# Patient Record
Sex: Female | Born: 1965 | Race: White | Hispanic: No | Marital: Single | Smoking: Current every day smoker
Health system: Southern US, Community
[De-identification: ages and names within clinical notes are randomized; demographics above are authoritative.]

## PROBLEM LIST (undated history)

## (undated) DIAGNOSIS — G8929 Other chronic pain: Secondary | ICD-10-CM

## (undated) DIAGNOSIS — R0600 Dyspnea, unspecified: Secondary | ICD-10-CM

## (undated) DIAGNOSIS — F17200 Nicotine dependence, unspecified, uncomplicated: Secondary | ICD-10-CM

## (undated) DIAGNOSIS — F1190 Opioid use, unspecified, uncomplicated: Secondary | ICD-10-CM

## (undated) DIAGNOSIS — D649 Anemia, unspecified: Secondary | ICD-10-CM

## (undated) DIAGNOSIS — I10 Essential (primary) hypertension: Secondary | ICD-10-CM

## (undated) DIAGNOSIS — F419 Anxiety disorder, unspecified: Secondary | ICD-10-CM

## (undated) HISTORY — PX: ABDOMINAL HYSTERECTOMY: SHX81

---

## 2003-02-14 ENCOUNTER — Emergency Department (HOSPITAL_COMMUNITY): Admission: EM | Admit: 2003-02-14 | Discharge: 2003-02-14 | Payer: Self-pay | Admitting: Emergency Medicine

## 2003-02-14 ENCOUNTER — Encounter: Payer: Self-pay | Admitting: Emergency Medicine

## 2006-07-22 ENCOUNTER — Emergency Department (HOSPITAL_COMMUNITY): Admission: EM | Admit: 2006-07-22 | Discharge: 2006-07-22 | Payer: Self-pay | Admitting: Emergency Medicine

## 2007-02-15 ENCOUNTER — Emergency Department (HOSPITAL_COMMUNITY): Admission: EM | Admit: 2007-02-15 | Discharge: 2007-02-15 | Payer: Self-pay | Admitting: Emergency Medicine

## 2007-08-07 ENCOUNTER — Emergency Department (HOSPITAL_COMMUNITY): Admission: EM | Admit: 2007-08-07 | Discharge: 2007-08-07 | Payer: Self-pay | Admitting: Emergency Medicine

## 2007-09-13 ENCOUNTER — Emergency Department (HOSPITAL_COMMUNITY): Admission: EM | Admit: 2007-09-13 | Discharge: 2007-09-13 | Payer: Self-pay | Admitting: Emergency Medicine

## 2011-06-25 LAB — COMPREHENSIVE METABOLIC PANEL
Alkaline Phosphatase: 58
BUN: 6
Creatinine, Ser: 0.6
Glucose, Bld: 94
Potassium: 3.4 — ABNORMAL LOW
Total Protein: 6.7

## 2011-06-25 LAB — RAPID URINE DRUG SCREEN, HOSP PERFORMED
Benzodiazepines: NOT DETECTED
Cocaine: POSITIVE — AB
Tetrahydrocannabinol: NOT DETECTED

## 2011-06-25 LAB — URINALYSIS, ROUTINE W REFLEX MICROSCOPIC
Ketones, ur: NEGATIVE
Leukocytes, UA: NEGATIVE
Nitrite: NEGATIVE
Protein, ur: NEGATIVE
pH: 5.5

## 2011-06-25 LAB — DIFFERENTIAL
Basophils Absolute: 0.1
Basophils Relative: 2 — ABNORMAL HIGH
Lymphocytes Relative: 29
Monocytes Relative: 4
Neutro Abs: 5.6
Neutrophils Relative %: 65

## 2011-06-25 LAB — CBC
HCT: 41.2
Hemoglobin: 13.9
MCHC: 33.9
MCV: 91.7
Platelets: 325
RDW: 13.7

## 2011-06-25 LAB — TRICYCLICS SCREEN, URINE: TCA Scrn: NOT DETECTED

## 2011-06-25 LAB — POCT PREGNANCY, URINE: Operator id: 146091

## 2011-06-25 LAB — ETHANOL: Alcohol, Ethyl (B): 217 — ABNORMAL HIGH

## 2012-05-03 ENCOUNTER — Emergency Department (HOSPITAL_COMMUNITY): Payer: Self-pay

## 2012-05-03 ENCOUNTER — Emergency Department (HOSPITAL_COMMUNITY)
Admission: EM | Admit: 2012-05-03 | Discharge: 2012-05-04 | Disposition: A | Discharge status: Home / Self Care | Payer: Self-pay | Attending: Emergency Medicine | Admitting: Emergency Medicine

## 2012-05-03 ENCOUNTER — Encounter (HOSPITAL_COMMUNITY): Payer: Self-pay | Admitting: *Deleted

## 2012-05-03 DIAGNOSIS — R079 Chest pain, unspecified: Secondary | ICD-10-CM | POA: Insufficient documentation

## 2012-05-03 DIAGNOSIS — R42 Dizziness and giddiness: Secondary | ICD-10-CM | POA: Insufficient documentation

## 2012-05-03 DIAGNOSIS — M542 Cervicalgia: Secondary | ICD-10-CM | POA: Insufficient documentation

## 2012-05-03 DIAGNOSIS — M549 Dorsalgia, unspecified: Secondary | ICD-10-CM | POA: Insufficient documentation

## 2012-05-03 LAB — CBC WITH DIFFERENTIAL/PLATELET
Basophils Relative: 0 % (ref 0–1)
Eosinophils Absolute: 0.2 10*3/uL (ref 0.0–0.7)
MCH: 31.4 pg (ref 26.0–34.0)
MCHC: 34.4 g/dL (ref 30.0–36.0)
Monocytes Relative: 4 % (ref 3–12)
Neutrophils Relative %: 76 % (ref 43–77)
Platelets: 283 10*3/uL (ref 150–400)
RDW: 12.9 % (ref 11.5–15.5)

## 2012-05-03 LAB — BASIC METABOLIC PANEL
BUN: 19 mg/dL (ref 6–23)
GFR calc Af Amer: >90 mL/min (ref >90)
GFR calc non Af Amer: 85 mL/min — ABNORMAL LOW (ref >90)
Potassium: 4.1 mEq/L (ref 3.5–5.1)
Sodium: 136 mEq/L (ref 135–145)

## 2012-05-03 LAB — URINALYSIS, ROUTINE W REFLEX MICROSCOPIC
Bilirubin Urine: NEGATIVE
Nitrite: NEGATIVE
Specific Gravity, Urine: 1.019 (ref 1.005–1.030)
Urobilinogen, UA: 0.2 mg/dL (ref 0.0–1.0)

## 2012-05-03 NOTE — ED Provider Notes (Signed)
9:13 PM  Date: 05/03/2012  Rate: 76  Rhythm: normal sinus rhythm  QRS Axis: normal  Intervals: normal  ST/T Wave abnormalities: normal  Conduction Disutrbances:none  Narrative Interpretation: Normal EKG  Old EKG Reviewed: unchanged    Carleene Cooper III, MD 05/03/12 2115

## 2012-05-03 NOTE — ED Notes (Signed)
Per EMS they were called out for bilateral shoulder pain and dizziness. Pt reported taking 325mg  ASA prior to their arrival. EMS placed 20g IV Lt hand.

## 2012-05-03 NOTE — ED Provider Notes (Signed)
History     CSN: [id number]  Arrival date & time 05/03/12  2057   First MD Initiated Contact with Patient 05/03/12 2105      Chief Complaint  Patient presents with  . Shoulder Pain  . Dizziness    (Consider location/radiation/quality/duration/timing/severity/associated sxs/prior treatment) HPI Comments: Patient comes in today with a chief complaint of pain.  She reports that the pain is primarily located between her scapula.  She states that the pain is "sharp" and typically last a few minutes and then resolves.  Pain has been intermittent throughout the day.  She also states, "I am not sure if the pain is in my chest or my back when it comes."  Nothing makes the pain worse or brings on the pain.  Pain is associated with some dizziness.  She describes the dizziness as feeling lightheaded.  She denies syncope.  She also reports that she has had intermittent pain shooting down both her right and her left arm.  She denies numbness or tingling. She denies nausea or vomiting.  Denies shortness of breath.  Denies fevers or chills.  No prolonged travel or surgery in the past 4 weeks,  She is not on any estrogen containing medications.  No prior history of DVT or PE.  No LE swelling or pain.  She currently smokes 1 ppd.  No known history of HTN, hyperlipidemia, or DM.  She does not have a PCP.    The history is provided by the patient.    History reviewed. No pertinent past medical history.  History reviewed. No pertinent past surgical history.  History reviewed. No pertinent family history.  History  Substance Use Topics  . Smoking status: Not on file  . Smokeless tobacco: Not on file  . Alcohol Use: Not on file    OB History    Grav Para Term Preterm Abortions TAB SAB Ect Mult Living                  Review of Systems  Constitutional: Negative for fever, chills and diaphoresis.  HENT: Positive for neck pain.   Eyes: Negative for visual disturbance.  Respiratory: Negative for  cough and shortness of breath.   Cardiovascular: Positive for chest pain. Negative for palpitations and leg swelling.  Gastrointestinal: Negative for nausea and vomiting.  Musculoskeletal: Positive for back pain. Negative for gait problem.  Skin: Negative for color change.  Neurological: Positive for dizziness and light-headedness. Negative for syncope, facial asymmetry, speech difficulty, weakness, numbness and headaches.    Allergies  Review of patient's allergies indicates no known allergies.  Home Medications  No current outpatient prescriptions on file.  BP 149/104  Pulse 77  Temp 97.7 F (36.5 C) (Oral)  Resp 20  SpO2 100%  Physical Exam  Nursing note and vitals reviewed. Constitutional: She appears well-developed and well-nourished. No distress.  HENT:  Head: Normocephalic and atraumatic.  Mouth/Throat: Oropharynx is clear and moist.  Neck: Normal range of motion. Neck supple.  Cardiovascular: Normal rate, regular rhythm and normal heart sounds.   Pulmonary/Chest: Effort normal and breath sounds normal. No respiratory distress. She has no wheezes. She has no rales. She exhibits no tenderness.  Abdominal: Soft. There is no tenderness.  Musculoskeletal: Normal range of motion. She exhibits no edema.       Cervical back: She exhibits no tenderness, no bony tenderness, no swelling, no edema and no deformity.       Thoracic back: She exhibits normal range of motion, no  tenderness, no bony tenderness, no swelling, no edema and no deformity.       Lumbar back: She exhibits normal range of motion, no tenderness, no bony tenderness, no swelling, no edema and no deformity.  Neurological: She is alert.  Skin: Skin is warm and dry. She is not diaphoretic.  Psychiatric: She has a normal mood and affect.    ED Course  Procedures (including critical care time)  Labs Reviewed - No data to display Dg Chest 2 View  05/03/2012  *RADIOLOGY REPORT*  Clinical Data: Chest pain  CHEST - 2  VIEW  Comparison: Prior chest x-ray 02/15/2007  Findings:  Mild hyperinflation and chronic peribronchial cuffing. Negative for pulmonary edema, focal airspace consolidation or pulmonary nodule.  Cardiac and mediastinal contours are within normal limits.  No pneumothorax, or pleural effusion. No acute osseous findings.  IMPRESSION:  No acute cardiopulmonary disease.  Unchanged mild hyperinflation chronic peribronchial cuffing.   Original Report Authenticated By: Vilma Prader      No diagnosis found.  Patient discussed with Dr. Ignacia Palma who also evaluated the patient.    MDM  Patient presenting with a chief complaint of back pain and chest pain.  Pain has been intermittent throughout the day.  No prior cardiac history.  EKG unchanged.  Troponin negative  VSS.  Pulse ox 100 on RA.  No acute findings on CXR.  Suspect that pain is musculoskeletal.  Return precautions have been discussed with the patient. She is in agreement with the plan.        Pascal Lux [place], PA-C 05/04/12 0020

## 2012-05-04 MED ORDER — IBUPROFEN 200 MG PO TABS
600.0000 mg | ORAL_TABLET | Freq: Once | ORAL | Status: AC
  Administered 2012-05-04: 600 mg via ORAL
  Filled 2012-05-04: qty 3

## 2012-05-04 NOTE — ED Provider Notes (Signed)
Medical screening examination/treatment/procedure(s) were conducted as a shared visit with non-physician practitioner(s) and myself.  I personally evaluated the patient during the encounter 46 yo woman with interscapular pain that goes into the left arm.  She has no history of injury.  She is a smoker.  No history of CAD.  Exam shows pain clearly located in midthoracic region, not anterior chest.  EKG benign.  Lab tests benign. Advised smoking cessation.  Carleene Cooper III, MD 05/04/12 1149

## 2012-05-04 NOTE — ED Notes (Signed)
Pt discharged home in good condition. IV started by EMS removed from pts Lt hand prior to discharge.

## 2014-10-08 DIAGNOSIS — R1032 Left lower quadrant pain: Secondary | ICD-10-CM | POA: Insufficient documentation

## 2014-10-09 DIAGNOSIS — F17200 Nicotine dependence, unspecified, uncomplicated: Secondary | ICD-10-CM | POA: Insufficient documentation

## 2014-10-09 DIAGNOSIS — N939 Abnormal uterine and vaginal bleeding, unspecified: Secondary | ICD-10-CM | POA: Insufficient documentation

## 2015-02-12 DIAGNOSIS — Z87898 Personal history of other specified conditions: Secondary | ICD-10-CM | POA: Insufficient documentation

## 2015-02-12 DIAGNOSIS — D649 Anemia, unspecified: Secondary | ICD-10-CM | POA: Insufficient documentation

## 2015-02-12 DIAGNOSIS — T7491XA Unspecified adult maltreatment, confirmed, initial encounter: Secondary | ICD-10-CM | POA: Insufficient documentation

## 2015-02-12 DIAGNOSIS — R002 Palpitations: Secondary | ICD-10-CM | POA: Insufficient documentation

## 2015-02-12 DIAGNOSIS — I10 Essential (primary) hypertension: Secondary | ICD-10-CM | POA: Insufficient documentation

## 2015-02-12 DIAGNOSIS — F1491 Cocaine use, unspecified, in remission: Secondary | ICD-10-CM | POA: Insufficient documentation

## 2015-02-12 DIAGNOSIS — Z720 Tobacco use: Secondary | ICD-10-CM | POA: Insufficient documentation

## 2015-02-12 DIAGNOSIS — M546 Pain in thoracic spine: Secondary | ICD-10-CM | POA: Insufficient documentation

## 2015-11-14 DIAGNOSIS — B029 Zoster without complications: Secondary | ICD-10-CM | POA: Insufficient documentation

## 2016-04-15 ENCOUNTER — Encounter (HOSPITAL_COMMUNITY): Payer: Self-pay | Admitting: *Deleted

## 2016-04-15 ENCOUNTER — Emergency Department (HOSPITAL_COMMUNITY)
Admission: EM | Admit: 2016-04-15 | Discharge: 2016-04-15 | Disposition: A | Discharge status: Home / Self Care | Payer: Self-pay | Attending: Emergency Medicine | Admitting: Emergency Medicine

## 2016-04-15 DIAGNOSIS — F17210 Nicotine dependence, cigarettes, uncomplicated: Secondary | ICD-10-CM | POA: Insufficient documentation

## 2016-04-15 DIAGNOSIS — I10 Essential (primary) hypertension: Secondary | ICD-10-CM | POA: Insufficient documentation

## 2016-04-15 DIAGNOSIS — K047 Periapical abscess without sinus: Secondary | ICD-10-CM | POA: Insufficient documentation

## 2016-04-15 HISTORY — DX: Anxiety disorder, unspecified: F41.9

## 2016-04-15 HISTORY — DX: Essential (primary) hypertension: I10

## 2016-04-15 MED ORDER — CLINDAMYCIN HCL 300 MG PO CAPS: 300.0000 mg | ORAL_CAPSULE | Freq: Three times a day (TID) | ORAL | 0 refills | Status: DC

## 2016-04-15 MED ORDER — NAPROXEN 250 MG PO TABS
500.0000 mg | ORAL_TABLET | Freq: Once | ORAL | Status: AC
  Administered 2016-04-15: 500 mg via ORAL
  Filled 2016-04-15: qty 2

## 2016-04-15 MED ORDER — CLINDAMYCIN HCL 150 MG PO CAPS
300.0000 mg | ORAL_CAPSULE | Freq: Once | ORAL | Status: AC
  Administered 2016-04-15: 300 mg via ORAL
  Filled 2016-04-15: qty 2

## 2016-04-15 MED ORDER — NAPROXEN 500 MG PO TABS: 500.0000 mg | ORAL_TABLET | Freq: Two times a day (BID) | ORAL | 0 refills | Status: DC | PRN

## 2016-04-15 NOTE — Discharge Instructions (Signed)
You have a dental infection. Please take all of your antibiotics until finished! Keep your scheduled appointment with your dentist. Naproxen as needed for pain.  Eat a soft or liquid diet and rinse your mouth out after meals with warm water. You should see a dentist or return here at once if you have increased swelling, increased pain or uncontrolled bleeding from the site of your injury.  SEEK MEDICAL CARE IF:  You have increased pain not controlled with medicines.  You have swelling around your tooth, in your face or neck.  You have bleeding which starts, continues, or gets worse.  You have a fever >101 If you are unable to open your mouth

## 2016-04-15 NOTE — ED Notes (Signed)
Declined W/C at D/C and was escorted to lobby by RN. 

## 2016-04-15 NOTE — ED Provider Notes (Signed)
[facility] DEPT Provider Note   By signing my name below, I, Mesha Guinyard, attest that this documentation has been prepared under the direction and in the presence of Treatment Team:  Attending Provider: Fredia Sorrow, MD Physician Assistant: Ozella Almond Ward, PA-C.  Electronically Signed: Verlee Monte, Medical Scribe. 04/15/16. 11:22 AM.  CSN: [id number] Arrival date & time: 04/15/16  1107  First Provider Contact:  None     History   Chief Complaint Chief Complaint  Patient presents with  . Dental Pain    HPI Comments: Lydia Collins is a 50 y.o. female who presents to the Emergency Department complaining of worsening top and bottom right sided dental pain onset 3 days ago. She states that she is having associated symptoms of a beige colored discharge from the top tooth that she can taste. Pt mentions she hasn't been sleeping or eating well in the past 3 days due to the dental pain. She states that she has tried Tylenol, Ibuprofen, and Aleve with no relief for her symptoms. Pt has a dentist appointment at [facility] to get her teeth removed in 2 weeks. She denies trouble breathing, and trouble swallowing and any other symptoms.  Pt also mentions right sided pain after lifting a crate of tomatoes at work. Pt is a Psychologist, sport and exercise. No abdominal pain, back pain, fever, dysuria.   The history is provided by the patient. No language interpreter was used.    Past Medical History:  Diagnosis Date  . Anxiety   . Hypertension     There are no active problems to display for this patient.   History reviewed. No pertinent surgical history.  OB History    No data available       Home Medications    Prior to Admission medications   Medication Sig Start Date End Date Taking? Authorizing Provider  clindamycin (CLEOCIN) 300 MG capsule Take 1 capsule (300 mg total) by mouth 3 (three) times daily. 04/15/16   Ozella Almond Ward, PA-C  naproxen (NAPROSYN) 500 MG tablet Take 1  tablet (500 mg total) by mouth 2 (two) times daily as needed for moderate pain. 04/15/16   [place], PA-C    Family History History reviewed. No pertinent family history.  Social History Social History  Substance Use Topics  . Smoking status: Current Every Day Smoker    Packs/day: 1.00    Types: Cigars  . Smokeless tobacco: Never Used  . Alcohol use No     Allergies   Review of patient's allergies indicates no known allergies.   Review of Systems Review of Systems  Constitutional: Negative for fever.  HENT: Positive for dental problem. Negative for trouble swallowing.   Respiratory: Negative for shortness of breath and wheezing.   Gastrointestinal: Negative for abdominal pain, nausea and vomiting.  Musculoskeletal: Positive for myalgias.  Psychiatric/Behavioral: Positive for sleep disturbance.    Physical Exam Updated Vital Signs BP 138/93 (BP Location: Right Arm)   Pulse 97   Temp 97.9 F (36.6 C) (Oral)   Resp 17   Ht 5\' 4"  (1.626 m)   Wt 55.8 kg   LMP 04/05/2012   SpO2 99%   BMI 21.11 kg/m   Physical Exam  Constitutional: She is oriented to person, place, and time. She appears well-developed and well-nourished. No distress.  HENT:  Head: Normocephalic and atraumatic.  Mouth/Throat:    Dental cavities and poor oral dentition noted, pain along tooth as depicted in image, midline uvula, no trismus, oropharynx moist  and clear, no abscess or drainage noted, no oropharyngeal erythema or edema, neck supple and no tenderness. No facial edema  Cardiovascular: Normal rate, regular rhythm, normal heart sounds and intact distal pulses.  Exam reveals no gallop and no friction rub.   No murmur heard. Pulmonary/Chest: Effort normal and breath sounds normal. No respiratory distress. She has no wheezes. She has no rales. She exhibits no tenderness.  Abdominal: Soft. Bowel sounds are normal. She exhibits no distension. There is no tenderness.  Musculoskeletal: She  exhibits no edema.  Neurological: She is alert and oriented to person, place, and time.  Skin: Skin is warm and dry.  Nursing note and vitals reviewed.    ED Treatments / Results  Labs (all labs ordered are listed, but only abnormal results are displayed) Labs Reviewed - No data to display  DIAGNOSTIC STUDIES: Oxygen Saturation is 99% on RA, nl by my interpretation.    COORDINATION OF CARE: 11:41 AM Discussed treatment plan with pt at bedside and pt agreed to plan.  EKG  EKG Interpretation None       Radiology No results found.  Procedures Procedures (including critical care time)  Medications Ordered in ED Medications  clindamycin (CLEOCIN) capsule 300 mg (not administered)  naproxen (NAPROSYN) tablet 500 mg (not administered)     Initial Impression / Assessment and Plan / ED Course  I have reviewed the triage vital signs and the nursing notes.  Pertinent labs & imaging results that were available during my care of the patient were reviewed by me and considered in my medical decision making (see chart for details).  Clinical Course   Patient with dentalgia. The patient states that she had aphasia colored drainage from the tooth, however on exam, I do not appreciate any discharge or abscess requiring incision and drainage. Patient is afebrile, non toxic appearing, and swallowing secretions well. Exam not concerning for Ludwig's angina or pharyngeal abscess. Will treat with Clinda. Coupon provided and patient states price is affordable. Rx for naproxen. Patient has scheduled appointment with [facility] 2 weeks for tooth extraction and I stressed the importance of dental follow up for ultimate management of dental pain. Patient voices understanding and is agreeable to plan.  Final Clinical Impressions(s) / ED Diagnoses   Final diagnoses:  Dental infection    New Prescriptions New Prescriptions   CLINDAMYCIN (CLEOCIN) 300 MG CAPSULE    Take 1  capsule (300 mg total) by mouth 3 (three) times daily.   NAPROXEN (NAPROSYN) 500 MG TABLET    Take 1 tablet (500 mg total) by mouth 2 (two) times daily as needed for moderate pain.    I personally performed the services described in this documentation, which was scribed in my presence. The recorded information has been reviewed and is accurate.    [facility] Ward, PA-C 04/15/16 1141    Fredia Sorrow, MD 04/16/16 1038

## 2016-04-15 NOTE — ED Triage Notes (Signed)
PT reports dental pain 10/10 . Pt has as appt at [facility]. The patient also reports she picked up a heavy box at work this morning . Pt reports she now has pain in the RT side.

## 2020-05-15 ENCOUNTER — Other Ambulatory Visit: Payer: Self-pay | Admitting: Family Medicine

## 2020-05-15 DIAGNOSIS — Z1231 Encounter for screening mammogram for malignant neoplasm of breast: Secondary | ICD-10-CM

## 2020-05-23 ENCOUNTER — Other Ambulatory Visit: Payer: Self-pay

## 2020-05-23 ENCOUNTER — Ambulatory Visit
Admission: RE | Admit: 2020-05-23 | Discharge: 2020-05-23 | Disposition: A | Discharge status: Home / Self Care | Payer: Medicaid Other | Source: Ambulatory Visit | Attending: Family Medicine | Admitting: Family Medicine

## 2020-05-23 DIAGNOSIS — Z1231 Encounter for screening mammogram for malignant neoplasm of breast: Secondary | ICD-10-CM

## 2020-05-27 ENCOUNTER — Other Ambulatory Visit: Payer: Self-pay

## 2020-05-27 ENCOUNTER — Other Ambulatory Visit: Payer: Self-pay | Admitting: Family Medicine

## 2020-05-27 DIAGNOSIS — R928 Other abnormal and inconclusive findings on diagnostic imaging of breast: Secondary | ICD-10-CM

## 2020-06-07 ENCOUNTER — Other Ambulatory Visit: Payer: Medicaid Other

## 2020-07-02 ENCOUNTER — Ambulatory Visit
Admission: RE | Admit: 2020-07-02 | Discharge: 2020-07-02 | Disposition: A | Discharge status: Home / Self Care | Payer: Medicaid Other | Source: Ambulatory Visit | Attending: Family Medicine | Admitting: Family Medicine

## 2020-07-02 ENCOUNTER — Other Ambulatory Visit: Payer: Self-pay

## 2020-07-02 ENCOUNTER — Other Ambulatory Visit: Payer: Self-pay | Admitting: Family Medicine

## 2020-07-02 DIAGNOSIS — R928 Other abnormal and inconclusive findings on diagnostic imaging of breast: Secondary | ICD-10-CM

## 2020-07-12 ENCOUNTER — Ambulatory Visit
Admission: RE | Admit: 2020-07-12 | Discharge: 2020-07-12 | Disposition: A | Discharge status: Home / Self Care | Payer: Medicaid Other | Source: Ambulatory Visit | Attending: Family Medicine | Admitting: Family Medicine

## 2020-07-12 ENCOUNTER — Other Ambulatory Visit: Payer: Self-pay

## 2020-07-12 ENCOUNTER — Other Ambulatory Visit: Payer: Medicaid Other

## 2020-07-12 DIAGNOSIS — R928 Other abnormal and inconclusive findings on diagnostic imaging of breast: Secondary | ICD-10-CM

## 2020-07-22 ENCOUNTER — Ambulatory Visit: Payer: Medicaid Other

## 2020-07-22 NOTE — Progress Notes (Signed)
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 Breast, left, needle core biopsy, 4 o'clock, lower outer - INVASIVE DUCTAL CARCINOMA, GRADE 1/2. - SEE MICROSCOPIC DESCRIPTION. 2. Breast, left, needle core biopsy, 2 o'clock, 6 cmfn, upper outer - FIBROADENOMA. - NO MALIGNANCY IDENTIFIED. 3. Breast, left, needle core biopsy, 2 o'clock, 8 cmfn, upper outer - FIBROADENOMA. - NO MALIGNANCY IDENTIFIED. Microscopic Comment 1. The greatest tumor dimension is 0.9 cm. The tumor is positive with GATA3 and E-cadherin. Breast prognostic profile will be performed.  Receptor Status: waiting on report ER(), PR (), Her2-neu (), Ki-67()  Did patient present with symptoms (if so, please note symptoms) or was this found on screening mammography?:    Past/Anticipated interventions by surgeon, if any: 07/17/2020 Dr. Autumn Messing   Past/Anticipated interventions by medical oncology, if any:  Referral placed, but patient has not been contacted yet  Lymphedema issues, if any:  Patient denies    Pain issues, if any:  Occasional sharp pains to the breast. Reports arthritis/sciatica pain to her left hip.    SAFETY ISSUES:  Prior radiation? No  Pacemaker/ICD? No  Possible current pregnancy? No--Partial hysterectomy   Is the patient on methotrexate? No  Current Complaints / other details: Nothing of note

## 2020-07-22 NOTE — Progress Notes (Signed)
Radiation Oncology         [phone] ________________________________  Initial Outpatient Consultation by telephone.  The patient opted for telemedicine to maximize safety during the pandemic.  MyChart video was not obtainable.   Name: Lydia Collins MRN: [id number]  Date: 07/23/2020  DOB: 1966/05/24  CC:No primary care provider on file.  Jovita Kussmaul, MD   REFERRING PHYSICIAN: Autumn Messing III, MD  DIAGNOSIS:    ICD-10-CM   1. 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 female who presented with left breast abnormality on the following imaging: bilateral screening mammogram on the date of 05/23/2020. No symptoms were reported at that time. Ultrasound of left breast on 07/02/2020 revealed a suspicious mass in the left breast at the 4 o'clock position that corresponded with the area of distortion on mammography. It also showed two adjacent masses in the left breast at the 2 o'clock position, which had a more benign appearance and were considered possible fibroadenomas. There was no evidence of left axillary lymphadenopathy. Biopsy on the date of 07/12/2020 showed invasive ductal carcinoma of the lower outer left breast. The two adjacent masses in the upper outer quadrant were positive for fibroadenoma; no malignancy was identified. ER status: pending; PR status: pending, Her2 status: pending; Grade: 1/2.  [place] report: Ultrasound of the left breast at 2 o'clock, 6 cm from the nipple demonstrates an oval hypoechoic mass with indistinct margins measuring 0.7 x 0.3 x 0.5 cm.  The left breast at 2 o'clock, 9 cm from the nipple demonstrates an oval circumscribed hypoechoic mass measuring 0.6 x 0.3 x 0.6  cm.  Ultrasound of the left breast at 4 o'clock, 4 cm from the nipple demonstrates a prominent area of shadowing which spans approximately 1.9 cm with harmonics imaging. The mass at the superficial margin of the shadowing measures 8 x 6 x 8 mm.  Ultrasound of the left axilla demonstrates multiple normal-appearing lymph nodes.  She anticipates breast conserving surgery with Dr. [name].  She is waiting for appointment with medical oncology.  She has received 2 Covid vaccines thus far and is eligible for booster now.  PREVIOUS RADIATION THERAPY: No  PAST MEDICAL HISTORY:  has a past medical history of Anxiety and Hypertension.    PAST SURGICAL HISTORY: Past Surgical History:  Procedure Laterality Date  . ABDOMINAL HYSTERECTOMY     partial hysterectomy     FAMILY HISTORY: family history is not on file.  SOCIAL HISTORY:  reports that she has been smoking cigarettes. She has been smoking about 1.00 pack per day. She has never used smokeless tobacco. She reports that she does not drink alcohol and does not use drugs.  ALLERGIES: Patient has no known allergies.  MEDICATIONS:  Current Outpatient Medications  Medication Sig Dispense Refill  . Acetaminophen-Codeine 300-30 MG tablet Take 1 tablet by mouth 3 (three) times daily as needed.    . ALPRAZolam (XANAX) 0.5 MG tablet Take 0.5 mg by mouth 2 (two) times daily as needed.    . chlorthalidone (HYGROTON) 25 MG tablet Take 25 mg by mouth every morning.    . naproxen (NAPROSYN) 500 MG tablet Take 1 tablet (500 mg total) by mouth 2 (two) times daily as  needed for moderate pain. 20 tablet 0   No current facility-administered medications for this encounter.    REVIEW OF SYSTEMS: as above   PHYSICAL EXAM:  vitals were not taken for this visit.    General: Alert and oriented, in no acute distress      LABORATORY DATA:  Lab Results  Component Value Date   WBC 15.9 (H) 05/03/2012   HGB 14.8 05/03/2012   HCT 43.0 05/03/2012   MCV  91.3 05/03/2012   PLT 283 05/03/2012   CMP     Component Value Date/Time   NA 136 05/03/2012 2128   K 4.1 05/03/2012 2128   CL 103 05/03/2012 2128   CO2 22 05/03/2012 2128   GLUCOSE 110 (H) 05/03/2012 2128   BUN 19 05/03/2012 2128   CREATININE 0.83 05/03/2012 2128   CALCIUM 9.9 05/03/2012 2128   PROT 6.7 02/15/2007 1716   ALBUMIN 3.5 02/15/2007 1716   AST 15 02/15/2007 1716   ALT 11 02/15/2007 1716   ALKPHOS 58 02/15/2007 1716   BILITOT 0.4 02/15/2007 1716   GFRNONAA 85 (L) 05/03/2012 2128   GFRAA >90 05/03/2012 2128         RADIOGRAPHY: US BREAST LTD UNI LEFT INC AXILLA  Result Date: 07/02/2020 CLINICAL DATA:  Screening recall from baseline for a possible left breast mass and possible left breast distortion. EXAM: DIGITAL DIAGNOSTIC UNILATERAL LEFT MAMMOGRAM WITH TOMO AND CAD; ULTRASOUND LEFT BREAST LIMITED COMPARISON:  Previous exam(s). ACR Breast Density Category b: There are scattered areas of fibroglandular density. FINDINGS: Spot compression tomosynthesis images through the upper outer quadrant of the left breast demonstrates 2 adjacent oval masses each measuring 8-9 mm. There is a prominent distortion in the lower outer quadrant of the left breast in the middle to posterior depth. Mammographic images were processed with CAD. Ultrasound of the left breast at 2 o'clock, 6 cm from the nipple demonstrates an oval hypoechoic mass with indistinct margins measuring 0.7 x 0.3 x 0.5 cm. The left breast at 2 o'clock, 9 cm from the nipple demonstrates an oval circumscribed hypoechoic mass measuring 0.6 x 0.3 x 0.6 cm. Ultrasound of the left breast at 4 o'clock, 4 cm from the nipple demonstrates a prominent area of shadowing which spans approximately 1.9 cm with harmonics imaging. The mass at the superficial margin of the shadowing measures 8 x 6 x 8 mm. Ultrasound of the left axilla demonstrates multiple normal-appearing lymph nodes. IMPRESSION: 1. There is a suspicious mass in the left  breast at 4 o'clock corresponding with the area of distortion on mammography. 2. There are 2 adjacent masses in the left breast at 2 o'clock which have a more benign appearance, possibly fibroadenomas. 3.  No evidence of left axillary lymphadenopathy. RECOMMENDATION: Ultrasound guided biopsy is recommended for the left breast mass at 4 o'clock and the 2 adjacent masses in the left breast at 2 o'clock. The procedures have been scheduled for 07/12/2020 at 1:15 p.m. I have discussed the findings and recommendations with the patient. If applicable, a reminder letter will be sent to the patient regarding the next appointment. BI-RADS CATEGORY  5: Highly suggestive of malignancy. Electronically Signed   By: Ammie Ferrier M.D.   On: 07/02/2020 13:35   MM DIAG BREAST TOMO UNI LEFT  Result Date: 07/02/2020 CLINICAL DATA:  Screening recall from baseline for a possible left breast mass and possible left breast distortion. EXAM: DIGITAL DIAGNOSTIC UNILATERAL LEFT MAMMOGRAM WITH TOMO AND CAD; ULTRASOUND LEFT BREAST LIMITED COMPARISON:  Previous  exam(s). ACR Breast Density Category b: There are scattered areas of fibroglandular density. FINDINGS: Spot compression tomosynthesis images through the upper outer quadrant of the left breast demonstrates 2 adjacent oval masses each measuring 8-9 mm. There is a prominent distortion in the lower outer quadrant of the left breast in the middle to posterior depth. Mammographic images were processed with CAD. Ultrasound of the left breast at 2 o'clock, 6 cm from the nipple demonstrates an oval hypoechoic mass with indistinct margins measuring 0.7 x 0.3 x 0.5 cm. The left breast at 2 o'clock, 9 cm from the nipple demonstrates an oval circumscribed hypoechoic mass measuring 0.6 x 0.3 x 0.6 cm. Ultrasound of the left breast at 4 o'clock, 4 cm from the nipple demonstrates a prominent area of shadowing which spans approximately 1.9 cm with harmonics imaging. The mass at the superficial  margin of the shadowing measures 8 x 6 x 8 mm. Ultrasound of the left axilla demonstrates multiple normal-appearing lymph nodes. IMPRESSION: 1. There is a suspicious mass in the left breast at 4 o'clock corresponding with the area of distortion on mammography. 2. There are 2 adjacent masses in the left breast at 2 o'clock which have a more benign appearance, possibly fibroadenomas. 3.  No evidence of left axillary lymphadenopathy. RECOMMENDATION: Ultrasound guided biopsy is recommended for the left breast mass at 4 o'clock and the 2 adjacent masses in the left breast at 2 o'clock. The procedures have been scheduled for 07/12/2020 at 1:15 p.m. I have discussed the findings and recommendations with the patient. If applicable, a reminder letter will be sent to the patient regarding the next appointment. BI-RADS CATEGORY  5: Highly suggestive of malignancy. Electronically Signed   By: Ammie Ferrier M.D.   On: 07/02/2020 13:35   MM CLIP PLACEMENT LEFT  Result Date: 07/12/2020 CLINICAL DATA:  Evaluate placement of biopsy clips following 3 separate ultrasound-guided LEFT breast biopsies. EXAM: DIAGNOSTIC LEFT MAMMOGRAM POST ULTRASOUND BIOPSY COMPARISON:  Previous exam(s). FINDINGS: Mammographic images were obtained following ultrasound guided biopsies of a LOWER OUTER LEFT breast mass and 2 UPPER-OUTER LEFT breast masses. The RIBBON biopsy marking clip is in expected position at the site of biopsy of the 1.9 cm mass at the 4 o'clock position of the LEFT breast. The COIL biopsy marking clip is in expected position at the site of biopsy of the 0.7 cm mass at the 2 o'clock position of the LEFT breast 6 cm from the nipple. The HEART biopsy marking clip is in expected position at the site of biopsy of the 0.6 cm mass at the 2 o'clock position of the LEFT breast 8 cm from the nipple. IMPRESSION: Appropriate positioning of the RIBBON shaped biopsy marking clip at the site of biopsy in the LOWER OUTER LEFT breast.  Appropriate positioning of the COIL shaped biopsy marking clip at the site of biopsy in the UPPER OUTER LEFT breast. Appropriate positioning of the HEART shaped biopsy marking clip at the site of biopsy in the UPPER OUTER LEFT breast. Final Assessment: Post Procedure Mammograms for Marker Placement Electronically Signed   By: Margarette Canada M.D.   On: 07/12/2020 15:16   [place] LT BREAST BX W LOC DEV 1ST LESION IMG BX SPEC US GUIDE  Addendum Date: 07/16/2020   ADDENDUM REPORT: 07/16/2020 11:46 ADDENDUM: Pathology revealed GRADE I-II INVASIVE DUCTAL CARCINOMA of the LEFT breast, 4 o'clock, lower outer, (ribbon clip). This was found to be concordant by Dr. Hassan Rowan. Pathology revealed FIBROADENOMA of the LEFT breast, 2  o'clock, 6 cmfn, upper outer, (coil clip). This was found to be concordant by Dr. Hassan Rowan. Pathology revealed FIBROADENOMA of the LEFT breast, 2 o'clock, 8 cmfn, upper outer, (heart clip). This was found to be concordant by Dr. Hassan Rowan. Pathology results were discussed with the patient by telephone. The patient reported doing well after the biopsies with tenderness at the sites. Post biopsy instructions and care were reviewed and questions were answered. The patient was encouraged to call [facility] for any additional concerns. My direct phone number was provided. Surgical consultation has been arranged with Dr. Autumn Messing at [facility] on July 17, 2020. Pathology results reported by Terie Purser, RN on 07/16/2020. Electronically Signed   By: Margarette Canada M.D.   On: 07/16/2020 11:46   Result Date: 07/16/2020 CLINICAL DATA:  54 year old female for tissue sampling of 1 LOWER OUTER LEFT breast mass and 2 UPPER-OUTER LEFT breast masses. EXAM: ULTRASOUND GUIDED LEFT BREAST CORE NEEDLE BIOPSY X 3 COMPARISON:  Previous exam(s). FINDINGS: I met with the patient and we discussed the procedures of ultrasound-guided biopsies, including benefits and alternatives. We  discussed the high likelihood of successful procedures. We discussed the risks of the procedures, including infection, bleeding, tissue injury, clip migration, and inadequate sampling. Informed written consent was given. The usual time-out protocol was performed immediately prior to the procedures. ULTRASOUND GUIDED LEFT BREAST CORE NEEDLE BIOPSY #1 (1.9 cm mass at the 4 o'clock position - RIBBON clip): Using sterile technique and 1% Lidocaine as local anesthetic, under direct ultrasound visualization, a 12 gauge spring-loaded device was used to perform biopsy of 1.9 cm mass at the 4 o'clock position of the LEFT breast using a MEDIAL approach. At the conclusion of the procedure a RIBBON tissue marker clip was deployed into the biopsy cavity. Follow up 2 view mammogram was performed and dictated separately. ULTRASOUND GUIDED LEFT BREAST CORE NEEDLE BIOPSY #2 (0.7 cm mass at the 2 o'clock position 6 cm from the nipple - COIL clip): Using sterile technique and 1% Lidocaine as local anesthetic, under direct ultrasound visualization, a 12 gauge spring-loaded device was used to perform biopsy of the 0.7 cm mass at the 2 o'clock position of the LEFT breast 6 cm from the nipple using a MEDIAL approach. At the conclusion of the procedure a COIL tissue marker clip was deployed into the biopsy cavity. Follow up 2 view mammogram was performed and dictated separately. ULTRASOUND GUIDED LEFT BREAST CORE NEEDLE BIOPSY #3 (0.6 cm mass at the 2 o'clock position 8 cm from the nipple (on prior study labeled as 9 cm from the nipple) - HEART clip): Using sterile technique and 1% Lidocaine as local anesthetic, under direct ultrasound visualization, a 12 gauge spring-loaded device was used to perform biopsy of the 0.6 cm mass at the 2 o'clock position of the LEFT breast 8 cm from the nipple using a MEDIAL approach. At the conclusion of the procedure a HEART tissue marker clip was deployed into the biopsy cavity. Follow up 2 view mammogram  was performed and dictated separately. IMPRESSION: Three separate ultrasound guided LEFT breast biopsies - of a 1.9 cm LOWER OUTER LEFT breast mass (RIBBON clip), of a 0.7 cm UPPER-OUTER LEFT breast mass (COIL clip) and of a 0.6 cm UPPER-OUTER LEFT breast mass (HEART clip). No apparent complications. Electronically Signed: By: Margarette Canada M.D. On: 07/12/2020 14:57   [place] LT BREAST BX W LOC DEV EA ADD LESION IMG BX SPEC US GUIDE  Addendum Date: 07/16/2020   ADDENDUM REPORT: 07/16/2020 11:46 ADDENDUM: Pathology revealed GRADE I-II INVASIVE DUCTAL CARCINOMA of the LEFT breast, 4 o'clock, lower outer, (ribbon clip). This was found to be concordant by Dr. Hassan Rowan. Pathology revealed FIBROADENOMA of the LEFT breast, 2 o'clock, 6 cmfn, upper outer, (coil clip). This was found to be concordant by Dr. Hassan Rowan. Pathology revealed FIBROADENOMA of the LEFT breast, 2 o'clock, 8 cmfn, upper outer, (heart clip). This was found to be concordant by Dr. Hassan Rowan. Pathology results were discussed with the patient by telephone. The patient reported doing well after the biopsies with tenderness at the sites. Post biopsy instructions and care were reviewed and questions were answered. The patient was encouraged to call [facility] for any additional concerns. My direct phone number was provided. Surgical consultation has been arranged with Dr. Autumn Messing at [facility] on July 17, 2020. Pathology results reported by Terie Purser, RN on 07/16/2020. Electronically Signed   By: Margarette Canada M.D.   On: 07/16/2020 11:46   Result Date: 07/16/2020 CLINICAL DATA:  54 year old female for tissue sampling of 1 LOWER OUTER LEFT breast mass and 2 UPPER-OUTER LEFT breast masses. EXAM: ULTRASOUND GUIDED LEFT BREAST CORE NEEDLE BIOPSY X 3 COMPARISON:  Previous exam(s). FINDINGS: I met with the patient and we discussed the procedures of ultrasound-guided biopsies, including benefits and alternatives. We  discussed the high likelihood of successful procedures. We discussed the risks of the procedures, including infection, bleeding, tissue injury, clip migration, and inadequate sampling. Informed written consent was given. The usual time-out protocol was performed immediately prior to the procedures. ULTRASOUND GUIDED LEFT BREAST CORE NEEDLE BIOPSY #1 (1.9 cm mass at the 4 o'clock position - RIBBON clip): Using sterile technique and 1% Lidocaine as local anesthetic, under direct ultrasound visualization, a 12 gauge spring-loaded device was used to perform biopsy of 1.9 cm mass at the 4 o'clock position of the LEFT breast using a MEDIAL approach. At the conclusion of the procedure a RIBBON tissue marker clip was deployed into the biopsy cavity. Follow up 2 view mammogram was performed and dictated separately. ULTRASOUND GUIDED LEFT BREAST CORE NEEDLE BIOPSY #2 (0.7 cm mass at the 2 o'clock position 6 cm from the nipple - COIL clip): Using sterile technique and 1% Lidocaine as local anesthetic, under direct ultrasound visualization, a 12 gauge spring-loaded device was used to perform biopsy of the 0.7 cm mass at the 2 o'clock position of the LEFT breast 6 cm from the nipple using a MEDIAL approach. At the conclusion of the procedure a COIL tissue marker clip was deployed into the biopsy cavity. Follow up 2 view mammogram was performed and dictated separately. ULTRASOUND GUIDED LEFT BREAST CORE NEEDLE BIOPSY #3 (0.6 cm mass at the 2 o'clock position 8 cm from the nipple (on prior study labeled as 9 cm from the nipple) - HEART clip): Using sterile technique and 1% Lidocaine as local anesthetic, under direct ultrasound visualization, a 12 gauge spring-loaded device was used to perform biopsy of the 0.6 cm mass at the 2 o'clock position of the LEFT breast 8 cm from the nipple using a MEDIAL approach. At the conclusion of the procedure a HEART tissue marker clip was deployed into the biopsy cavity. Follow up 2 view mammogram  was performed and dictated separately. IMPRESSION: Three separate ultrasound guided LEFT breast biopsies - of a 1.9 cm LOWER OUTER LEFT breast mass (RIBBON clip), of a 0.7 cm UPPER-OUTER LEFT breast  mass (COIL clip) and of a 0.6 cm UPPER-OUTER LEFT breast mass (HEART clip). No apparent complications. Electronically Signed: By: Margarette Canada M.D. On: 07/12/2020 14:57   [place] LT BREAST BX W LOC DEV EA ADD LESION IMG BX SPEC US GUIDE  Addendum Date: 07/16/2020   ADDENDUM REPORT: 07/16/2020 11:46 ADDENDUM: Pathology revealed GRADE I-II INVASIVE DUCTAL CARCINOMA of the LEFT breast, 4 o'clock, lower outer, (ribbon clip). This was found to be concordant by Dr. Hassan Rowan. Pathology revealed FIBROADENOMA of the LEFT breast, 2 o'clock, 6 cmfn, upper outer, (coil clip). This was found to be concordant by Dr. Hassan Rowan. Pathology revealed FIBROADENOMA of the LEFT breast, 2 o'clock, 8 cmfn, upper outer, (heart clip). This was found to be concordant by Dr. Hassan Rowan. Pathology results were discussed with the patient by telephone. The patient reported doing well after the biopsies with tenderness at the sites. Post biopsy instructions and care were reviewed and questions were answered. The patient was encouraged to call [facility] [place] for any additional concerns. My direct phone number was provided. Surgical consultation has been arranged with Dr. Autumn Messing at [facility] on July 17, 2020. Pathology results reported by Terie Purser, RN on 07/16/2020. Electronically Signed   By: Margarette Canada M.D.   On: 07/16/2020 11:46   Result Date: 07/16/2020 CLINICAL DATA:  54 year old female for tissue sampling of 1 LOWER OUTER LEFT breast mass and 2 UPPER-OUTER LEFT breast masses. EXAM: ULTRASOUND GUIDED LEFT BREAST CORE NEEDLE BIOPSY X 3 COMPARISON:  Previous exam(s). FINDINGS: I met with the patient and we discussed the procedures of ultrasound-guided biopsies, including benefits and alternatives. We  discussed the high likelihood of successful procedures. We discussed the risks of the procedures, including infection, bleeding, tissue injury, clip migration, and inadequate sampling. Informed written consent was given. The usual time-out protocol was performed immediately prior to the procedures. ULTRASOUND GUIDED LEFT BREAST CORE NEEDLE BIOPSY #1 (1.9 cm mass at the 4 o'clock position - RIBBON clip): Using sterile technique and 1% Lidocaine as local anesthetic, under direct ultrasound visualization, a 12 gauge spring-loaded device was used to perform biopsy of 1.9 cm mass at the 4 o'clock position of the LEFT breast using a MEDIAL approach. At the conclusion of the procedure a RIBBON tissue marker clip was deployed into the biopsy cavity. Follow up 2 view mammogram was performed and dictated separately. ULTRASOUND GUIDED LEFT BREAST CORE NEEDLE BIOPSY #2 (0.7 cm mass at the 2 o'clock position 6 cm from the nipple - COIL clip): Using sterile technique and 1% Lidocaine as local anesthetic, under direct ultrasound visualization, a 12 gauge spring-loaded device was used to perform biopsy of the 0.7 cm mass at the 2 o'clock position of the LEFT breast 6 cm from the nipple using a MEDIAL approach. At the conclusion of the procedure a COIL tissue marker clip was deployed into the biopsy cavity. Follow up 2 view mammogram was performed and dictated separately. ULTRASOUND GUIDED LEFT BREAST CORE NEEDLE BIOPSY #3 (0.6 cm mass at the 2 o'clock position 8 cm from the nipple (on prior study labeled as 9 cm from the nipple) - HEART clip): Using sterile technique and 1% Lidocaine as local anesthetic, under direct ultrasound visualization, a 12 gauge spring-loaded device was used to perform biopsy of the 0.6 cm mass at the 2 o'clock position of the LEFT breast 8 cm from the nipple using a MEDIAL approach. At the conclusion of the procedure a HEART tissue marker  clip was deployed into the biopsy cavity. Follow up 2 view mammogram  was performed and dictated separately. IMPRESSION: Three separate ultrasound guided LEFT breast biopsies - of a 1.9 cm LOWER OUTER LEFT breast mass (RIBBON clip), of a 0.7 cm UPPER-OUTER LEFT breast mass (COIL clip) and of a 0.6 cm UPPER-OUTER LEFT breast mass (HEART clip). No apparent complications. Electronically Signed: By: Margarette Canada M.D. On: 07/12/2020 14:57      IMPRESSION/PLAN: Left breast cancer  It was a pleasure meeting the patient today.  The consensus is that she would be a good candidate for breast conservation. I talked to her about the option of a mastectomy and informed her that her expected overall survival would be equivalent between mastectomy and breast conservation, based upon randomized controlled data. She is enthusiastic about breast conservation.  I am not sure why the receptor status is still pending for her biopsy.  We have called pathology about this.  She understands that more information will need to be gathered to determine systemic therapy recommendations.  We discussed the risks, benefits, and side effects of radiotherapy. I recommend radiotherapy to the left breast to reduce her risk of locoregional recurrence by 2/3.  We discussed that radiation would take approximately 4-6 weeks to complete and that I would give the patient a few weeks to heal following surgery before starting treatment planning.  If chemotherapy were to be given, this would precede radiotherapy. We spoke about acute effects including skin irritation and fatigue as well as much less common late effects including internal organ injury or irritation. We spoke about the latest technology that is used to minimize the risk of late effects for patients undergoing radiotherapy to the breast or chest wall. No guarantees of treatment were given. The patient is enthusiastic about proceeding with treatment. I look forward to participating in the patient's care.  I will await her referral back to me for  postoperative follow-up and eventual CT simulation/treatment planning.  We discussed measures to reduce the risk of infection during the COVID-19 pandemic.  She is eligible for her booster shot.  She is going to arrange this at a local pharmacy.  She knows to get this administered to her right arm.  On date of service, in total, I spent 30 minutes on this encounter. This encounter was provided by telemedicine platform by telephone.  The patient opted for telemedicine to maximize safety during the pandemic.  MyChart video was not obtainable. The patient has given verbal consent for this type of encounter and has been advised to only accept a meeting of this type in a secure network environment. The attendants for this meeting include Eppie Gibson  and Daisey Must.  During the encounter, Eppie Gibson was located at [facility].  Daisey Must was located at home.    __________________________________________   Eppie Gibson, MD  This document serves as a record of services personally performed by Eppie Gibson, MD. It was created on his behalf by Clerance Lav, a trained medical scribe. The creation of this record is based on the scribe's personal observations and the provider's statements to them. This document has been checked and approved by the attending provider.

## 2020-07-23 ENCOUNTER — Encounter: Payer: Self-pay | Admitting: Radiation Oncology

## 2020-07-23 ENCOUNTER — Ambulatory Visit
Admission: RE | Admit: 2020-07-23 | Discharge: 2020-07-23 | Disposition: A | Discharge status: Home / Self Care | Payer: Medicaid Other | Source: Ambulatory Visit | Attending: Radiation Oncology | Admitting: Radiation Oncology

## 2020-07-23 ENCOUNTER — Other Ambulatory Visit: Payer: Self-pay

## 2020-07-23 DIAGNOSIS — C50512 Malignant neoplasm of lower-outer quadrant of left female breast: Secondary | ICD-10-CM | POA: Insufficient documentation

## 2020-07-24 ENCOUNTER — Ambulatory Visit: Payer: Self-pay | Admitting: General Surgery

## 2020-07-24 DIAGNOSIS — Z170 Estrogen receptor positive status [ER+]: Secondary | ICD-10-CM

## 2020-07-30 ENCOUNTER — Telehealth: Payer: Self-pay | Admitting: Hematology and Oncology

## 2020-07-30 ENCOUNTER — Other Ambulatory Visit: Payer: Self-pay | Admitting: General Surgery

## 2020-07-30 DIAGNOSIS — Z170 Estrogen receptor positive status [ER+]: Secondary | ICD-10-CM

## 2020-07-30 DIAGNOSIS — C50512 Malignant neoplasm of lower-outer quadrant of left female breast: Secondary | ICD-10-CM

## 2020-07-30 NOTE — Telephone Encounter (Signed)
Scheduled appointment per 11/23 new patient referral. Spoke to patient who is aware of appointment date and time.

## 2020-08-07 DIAGNOSIS — C801 Malignant (primary) neoplasm, unspecified: Secondary | ICD-10-CM

## 2020-08-07 HISTORY — DX: Malignant (primary) neoplasm, unspecified: C80.1

## 2020-08-07 NOTE — Progress Notes (Signed)
[facility] CONSULT NOTE  Patient Care Team: Patient, [name] as PCP - General (General Practice)  CHIEF COMPLAINTS/PURPOSE OF CONSULTATION:  Newly diagnosed breast cancer  HISTORY OF PRESENTING ILLNESS:  Lydia Collins 54 y.o. female is here because of recent diagnosis of invasive ductal carcinoma of the left breast. Screening mammogram on 05/23/20 showed a left breast distortion. Diagnostic mammogram on 07/02/20 showed a mass at the 4 o'clock position in the left breast, 0.8cm, and 2 adjacent masses in the left breast at the 2 o'clock position, 0.7cm and 0.6cm, and no left axillary lymphadenopathy. Biopsy on 07/12/20 showed no evidence of malignancy at the 2 o'clock position, and invasive ductal carcinoma at the 4 o'clock position, grade 1, HER-2 negative (1+), ER/PR+ 95%, Ki67 5%. She presents to the clinic today for initial evaluation and discussion of treatment options.   I reviewed her records extensively and collaborated the history with the patient.  SUMMARY OF ONCOLOGIC HISTORY: Oncology History  Malignant neoplasm of lower-outer quadrant of left female breast ([place])  07/23/2020 Initial Diagnosis   Screening mammogram showed a left breast distortion. Diagnostic mammogram showed a mass at the 4 o'clock position in the left breast, 0.8cm, and 2 adjacent masses in the left breast at the 2 o'clock position, 0.7cm and 0.6cm, and no left axillary lymphadenopathy. Biopsy showed no evidence of malignancy at the 2 o'clock position, and IDC at the 4 o'clock position, grade 1, HER-2 negative (1+), ER/PR+ 95%, Ki67 5%.      MEDICAL HISTORY:  Past Medical History:  Diagnosis Date  . Anxiety   . Hypertension     SURGICAL HISTORY: Past Surgical History:  Procedure Laterality Date  . ABDOMINAL HYSTERECTOMY     partial hysterectomy     SOCIAL HISTORY: Social History   Socioeconomic History  . Marital status: Single    Spouse name: Not on file  . Number of children:  Not on file  . Years of education: Not on file  . Highest education level: Not on file  Occupational History  . Not on file  Tobacco Use  . Smoking status: Current Every Day Smoker    Packs/day: 1.00    Types: Cigarettes  . Smokeless tobacco: Never Used  Vaping Use  . Vaping Use: Never used  Substance and Sexual Activity  . Alcohol use: No  . Drug use: No  . Sexual activity: Yes    Birth control/protection: Surgical  Other Topics Concern  . Not on file  Social History Narrative  . Not on file   Social Determinants of Health   Financial Resource Strain:   . Difficulty of Paying Living Expenses: Not on file  Food Insecurity:   . Worried About Charity fundraiser in the Last Year: Not on file  . Ran Out of Food in the Last Year: Not on file  Transportation Needs:   . Lack of Transportation (Medical): Not on file  . Lack of Transportation (Non-Medical): Not on file  Physical Activity:   . Days of Exercise per Week: Not on file  . Minutes of Exercise per Session: Not on file  Stress:   . Feeling of Stress : Not on file  Social Connections:   . Frequency of Communication with Friends and Family: Not on file  . Frequency of Social Gatherings with Friends and Family: Not on file  . Attends Religious Services: Not on file  . Active Member of Clubs or Organizations: Not on file  .  Attends Archivist Meetings: Not on file  . Marital Status: Not on file  Intimate Partner Violence:   . Fear of Current or Ex-Partner: Not on file  . Emotionally Abused: Not on file  . Physically Abused: Not on file  . Sexually Abused: Not on file    FAMILY HISTORY: Family History  Problem Relation Age of Onset  . Breast cancer Neg Hx     ALLERGIES:  has No Known Allergies.  MEDICATIONS:  Current Outpatient Medications  Medication Sig Dispense Refill  . Acetaminophen-Codeine 300-30 MG tablet Take 1 tablet by mouth 3 (three) times daily as needed.    . ALPRAZolam (XANAX) 0.5 MG  tablet Take 1 tablet (0.5 mg total) by mouth 2 (two) times daily as needed. 15 tablet 0  . chlorthalidone (HYGROTON) 25 MG tablet Take 25 mg by mouth every morning.    . naproxen (NAPROSYN) 500 MG tablet Take 1 tablet (500 mg total) by mouth 2 (two) times daily as needed for moderate pain. 20 tablet 0   No current facility-administered medications for this visit.    REVIEW OF SYSTEMS:     All other systems were reviewed with the patient and are negative.  PHYSICAL EXAMINATION: ECOG PERFORMANCE STATUS: 1 - Symptomatic but completely ambulatory  Vitals:   08/08/20 1612  BP: (!) 143/93  Pulse: (!) 107  Resp: 17  Temp: 98.3 F (36.8 C)  SpO2: 99%   Filed Weights   08/08/20 1612  Weight: 183 lb 1.6 oz (83.1 kg)       LABORATORY DATA:  I have reviewed the data as listed Lab Results  Component Value Date   WBC 15.9 (H) 05/03/2012   HGB 14.8 05/03/2012   HCT 43.0 05/03/2012   MCV 91.3 05/03/2012   PLT 283 05/03/2012   Lab Results  Component Value Date   NA 136 05/03/2012   K 4.1 05/03/2012   CL 103 05/03/2012   CO2 22 05/03/2012    RADIOGRAPHIC STUDIES: I have personally reviewed the radiological reports and agreed with the findings in the report.  ASSESSMENT AND PLAN:  Malignant neoplasm of lower-outer quadrant of left female breast ([place]) Screening mammogram showed a left breast distortion. Diagnostic mammogram showed a mass at the 4 o'clock position in the left breast, 0.8cm, and 2 adjacent masses in the left breast at the 2 o'clock position, 0.7cm and 0.6cm, and no left axillary lymphadenopathy. Biopsy showed no evidence of malignancy at the 2 o'clock position, and IDC at the 4 o'clock position, grade 1, HER-2 negative (1+), ER/PR+ 95%, Ki67 5%.   Recommendations: 1. Breast conserving surgery followed by 2. Adjuvant radiation therapy followed by 3. Adjuvant antiestrogen therapy  Return to clinic 1 week after surgery to discuss the pathology report and confirm  the adjuvant treatment plan.  All questions were answered. The patient knows to call the clinic with any problems, questions or concerns.   Rulon Eisenmenger, MD, MPH 08/08/2020    I, Molly Dorshimer, am acting as scribe for Nicholas Lose, MD.  I have reviewed the above documentation for accuracy and completeness, and I agree with the above.

## 2020-08-07 NOTE — Assessment & Plan Note (Signed)
Screening mammogram showed a left breast distortion. Diagnostic mammogram showed a mass at the 4 o'clock position in the left breast, 0.8cm, and 2 adjacent masses in the left breast at the 2 o'clock position, 0.7cm and 0.6cm, and no left axillary lymphadenopathy. Biopsy showed no evidence of malignancy at the 2 o'clock position, and IDC at the 4 o'clock position, grade 1, HER-2 negative (1+), ER/PR+ 95%, Ki67 5%.   Recommendations: 1. Breast conserving surgery followed by 2. Adjuvant radiation therapy followed by 3. Adjuvant antiestrogen therapy

## 2020-08-08 ENCOUNTER — Inpatient Hospital Stay: Payer: Medicaid Other | Attending: Hematology and Oncology | Admitting: Hematology and Oncology

## 2020-08-08 ENCOUNTER — Other Ambulatory Visit: Payer: Self-pay

## 2020-08-08 DIAGNOSIS — F17210 Nicotine dependence, cigarettes, uncomplicated: Secondary | ICD-10-CM | POA: Diagnosis not present

## 2020-08-08 DIAGNOSIS — C50512 Malignant neoplasm of lower-outer quadrant of left female breast: Secondary | ICD-10-CM | POA: Insufficient documentation

## 2020-08-08 MED ORDER — ALPRAZOLAM 0.5 MG PO TABS
0.5000 mg | ORAL_TABLET | Freq: Two times a day (BID) | ORAL | 0 refills | Status: DC | PRN
Start: 2020-08-08 — End: 2020-08-19

## 2020-08-09 ENCOUNTER — Encounter: Payer: Self-pay | Admitting: *Deleted

## 2020-08-09 ENCOUNTER — Telehealth: Payer: Self-pay | Admitting: *Deleted

## 2020-08-09 NOTE — Telephone Encounter (Signed)
Called pt, provided navigation resources and contact information. Denies questions or concerns regarding dx or treatment care plan. Encourage pt to call with needs. Received verbal understanding. Scheduled and confirmed appt with Dr. Lindi Adie on 1/3 at 9:15am for post op

## 2020-08-13 ENCOUNTER — Encounter: Payer: Self-pay | Admitting: Licensed Clinical Social Worker

## 2020-08-13 NOTE — Progress Notes (Signed)
[place] Work  Clinical Social Work was referred by new patient protocol for assessment of psychosocial needs.  Clinical Social Worker contacted patient by phone  to offer support and assess for needs.    Patient reports stressor of the boarding house she and her boyfriend live in being sold to another person and rent now being tripled. Has been told she needs to move out in ten days but has not received an official eviction letter through court system. Patient has already reached out to local agencies for potential assistance. CSW also gave information on Legal Aid and patient stated she will call them and then let this CSW know the outcome.  No other needs at this time.     [place], [place] Worker [employer]

## 2020-08-19 ENCOUNTER — Encounter (HOSPITAL_BASED_OUTPATIENT_CLINIC_OR_DEPARTMENT_OTHER): Payer: Self-pay | Admitting: General Surgery

## 2020-08-19 ENCOUNTER — Other Ambulatory Visit: Payer: Self-pay

## 2020-08-20 ENCOUNTER — Other Ambulatory Visit (HOSPITAL_COMMUNITY): Payer: Medicaid Other

## 2020-08-20 ENCOUNTER — Encounter (HOSPITAL_BASED_OUTPATIENT_CLINIC_OR_DEPARTMENT_OTHER)
Admission: RE | Admit: 2020-08-20 | Discharge: 2020-08-20 | Disposition: A | Discharge status: Home / Self Care | Payer: Medicaid Other | Source: Ambulatory Visit | Attending: General Surgery | Admitting: General Surgery

## 2020-08-20 ENCOUNTER — Ambulatory Visit: Payer: Medicaid Other | Attending: General Surgery | Admitting: Rehabilitation

## 2020-08-20 DIAGNOSIS — Z01812 Encounter for preprocedural laboratory examination: Secondary | ICD-10-CM | POA: Insufficient documentation

## 2020-08-20 LAB — BASIC METABOLIC PANEL
Anion gap: 10 (ref 5–15)
BUN: 10 mg/dL (ref 6–20)
CO2: 29 mmol/L (ref 22–32)
Calcium: 9.2 mg/dL (ref 8.9–10.3)
Chloride: 101 mmol/L (ref 98–111)
Creatinine, Ser: 1 mg/dL (ref 0.44–1.00)
GFR, Estimated: >60 mL/min (ref >60)
Glucose, Bld: 98 mg/dL (ref 70–99)
Potassium: 3.8 mmol/L (ref 3.5–5.1)
Sodium: 140 mmol/L (ref 135–145)

## 2020-08-20 NOTE — Progress Notes (Signed)

## 2020-08-22 ENCOUNTER — Encounter (HOSPITAL_COMMUNITY): Payer: Self-pay | Admitting: Certified Registered"

## 2020-08-22 ENCOUNTER — Other Ambulatory Visit (HOSPITAL_COMMUNITY)
Admission: RE | Admit: 2020-08-22 | Discharge: 2020-08-22 | Disposition: A | Discharge status: Home / Self Care | Payer: Medicaid Other | Source: Ambulatory Visit | Attending: General Surgery | Admitting: General Surgery

## 2020-08-22 ENCOUNTER — Inpatient Hospital Stay: Admission: RE | Admit: 2020-08-22 | Payer: Medicaid Other | Source: Ambulatory Visit

## 2020-08-22 DIAGNOSIS — Z01812 Encounter for preprocedural laboratory examination: Secondary | ICD-10-CM | POA: Insufficient documentation

## 2020-08-22 DIAGNOSIS — Z20822 Contact with and (suspected) exposure to covid-19: Secondary | ICD-10-CM | POA: Diagnosis not present

## 2020-08-22 LAB — SARS CORONAVIRUS 2 (TAT 6-24 HRS): SARS Coronavirus 2: NEGATIVE

## 2020-08-22 MED ORDER — BUPIVACAINE-EPINEPHRINE (PF) 0.25% -1:200000 IJ SOLN
INTRAMUSCULAR | Status: AC
  Filled 2020-08-22: qty 30

## 2020-08-22 NOTE — Progress Notes (Signed)
Called and spoke with patient about getting her COVID test for surgery tomorrow. Patient was currently on the way to get COVID test and her breast seed appointment. Gave patient the address to the COVID testing site and told her they will close at 3pm she stated she would be able to make it in time.

## 2020-08-23 ENCOUNTER — Encounter (HOSPITAL_COMMUNITY): Payer: Medicaid Other

## 2020-08-23 ENCOUNTER — Ambulatory Visit (HOSPITAL_BASED_OUTPATIENT_CLINIC_OR_DEPARTMENT_OTHER): Admission: RE | Admit: 2020-08-23 | Payer: Medicaid Other | Source: Home / Self Care | Admitting: General Surgery

## 2020-08-23 SURGERY — BREAST LUMPECTOMY WITH RADIOACTIVE SEED AND SENTINEL LYMPH NODE BIOPSY
Anesthesia: General | Site: Breast | Laterality: Left

## 2020-08-27 ENCOUNTER — Encounter: Payer: Self-pay | Admitting: *Deleted

## 2020-08-28 ENCOUNTER — Telehealth: Payer: Self-pay | Admitting: Hematology and Oncology

## 2020-08-28 NOTE — Telephone Encounter (Signed)
Scheduled apt per 12/21 sch msg - pt is aware of appt date and time

## 2020-09-04 ENCOUNTER — Encounter (HOSPITAL_BASED_OUTPATIENT_CLINIC_OR_DEPARTMENT_OTHER): Payer: Self-pay | Admitting: General Surgery

## 2020-09-04 ENCOUNTER — Other Ambulatory Visit: Payer: Self-pay

## 2020-09-09 ENCOUNTER — Ambulatory Visit: Payer: Medicaid Other | Admitting: Hematology and Oncology

## 2020-09-10 ENCOUNTER — Other Ambulatory Visit (HOSPITAL_COMMUNITY)
Admission: RE | Admit: 2020-09-10 | Discharge: 2020-09-10 | Disposition: A | Discharge status: Home / Self Care | Payer: Medicaid Other | Source: Ambulatory Visit | Attending: General Surgery | Admitting: General Surgery

## 2020-09-10 DIAGNOSIS — Z20822 Contact with and (suspected) exposure to covid-19: Secondary | ICD-10-CM | POA: Diagnosis not present

## 2020-09-10 DIAGNOSIS — Z01812 Encounter for preprocedural laboratory examination: Secondary | ICD-10-CM | POA: Diagnosis not present

## 2020-09-11 LAB — SARS CORONAVIRUS 2 (TAT 6-24 HRS): SARS Coronavirus 2: NEGATIVE

## 2020-09-12 ENCOUNTER — Other Ambulatory Visit: Payer: Self-pay

## 2020-09-12 ENCOUNTER — Ambulatory Visit
Admission: RE | Admit: 2020-09-12 | Discharge: 2020-09-12 | Disposition: A | Discharge status: Home / Self Care | Payer: Medicaid Other | Source: Ambulatory Visit | Attending: General Surgery | Admitting: General Surgery

## 2020-09-12 DIAGNOSIS — Z170 Estrogen receptor positive status [ER+]: Secondary | ICD-10-CM

## 2020-09-12 DIAGNOSIS — C50512 Malignant neoplasm of lower-outer quadrant of left female breast: Secondary | ICD-10-CM

## 2020-09-13 ENCOUNTER — Ambulatory Visit (HOSPITAL_BASED_OUTPATIENT_CLINIC_OR_DEPARTMENT_OTHER): Payer: Medicaid Other | Admitting: Certified Registered"

## 2020-09-13 ENCOUNTER — Encounter (HOSPITAL_COMMUNITY)
Admission: RE | Admit: 2020-09-13 | Discharge: 2020-09-13 | Disposition: A | Discharge status: Home / Self Care | Payer: Medicaid Other | Source: Ambulatory Visit | Attending: General Surgery | Admitting: General Surgery

## 2020-09-13 ENCOUNTER — Other Ambulatory Visit: Payer: Self-pay

## 2020-09-13 ENCOUNTER — Ambulatory Visit
Admission: RE | Admit: 2020-09-13 | Discharge: 2020-09-13 | Disposition: A | Discharge status: Home / Self Care | Payer: Medicaid Other | Source: Ambulatory Visit | Attending: General Surgery | Admitting: General Surgery

## 2020-09-13 ENCOUNTER — Encounter (HOSPITAL_BASED_OUTPATIENT_CLINIC_OR_DEPARTMENT_OTHER)
Admission: RE | Disposition: A | Discharge status: Home / Self Care | Payer: Self-pay | Source: Home / Self Care | Attending: General Surgery

## 2020-09-13 ENCOUNTER — Encounter (HOSPITAL_BASED_OUTPATIENT_CLINIC_OR_DEPARTMENT_OTHER): Payer: Self-pay | Admitting: General Surgery

## 2020-09-13 ENCOUNTER — Ambulatory Visit (HOSPITAL_BASED_OUTPATIENT_CLINIC_OR_DEPARTMENT_OTHER)
Admission: RE | Admit: 2020-09-13 | Discharge: 2020-09-13 | Disposition: A | Discharge status: Home / Self Care | Payer: Medicaid Other | Attending: General Surgery | Admitting: General Surgery

## 2020-09-13 DIAGNOSIS — Z808 Family history of malignant neoplasm of other organs or systems: Secondary | ICD-10-CM | POA: Diagnosis not present

## 2020-09-13 DIAGNOSIS — C50512 Malignant neoplasm of lower-outer quadrant of left female breast: Secondary | ICD-10-CM

## 2020-09-13 DIAGNOSIS — C773 Secondary and unspecified malignant neoplasm of axilla and upper limb lymph nodes: Secondary | ICD-10-CM | POA: Diagnosis not present

## 2020-09-13 DIAGNOSIS — Z800 Family history of malignant neoplasm of digestive organs: Secondary | ICD-10-CM | POA: Insufficient documentation

## 2020-09-13 DIAGNOSIS — Z79899 Other long term (current) drug therapy: Secondary | ICD-10-CM | POA: Diagnosis not present

## 2020-09-13 DIAGNOSIS — I10 Essential (primary) hypertension: Secondary | ICD-10-CM | POA: Diagnosis not present

## 2020-09-13 DIAGNOSIS — Z170 Estrogen receptor positive status [ER+]: Secondary | ICD-10-CM

## 2020-09-13 HISTORY — PX: BREAST LUMPECTOMY WITH RADIOACTIVE SEED AND SENTINEL LYMPH NODE BIOPSY: SHX6550

## 2020-09-13 HISTORY — DX: Dyspnea, unspecified: R06.00

## 2020-09-13 HISTORY — DX: Other chronic pain: G89.29

## 2020-09-13 HISTORY — DX: Opioid use, unspecified, uncomplicated: F11.90

## 2020-09-13 HISTORY — DX: Nicotine dependence, unspecified, uncomplicated: F17.200

## 2020-09-13 SURGERY — BREAST LUMPECTOMY WITH RADIOACTIVE SEED AND SENTINEL LYMPH NODE BIOPSY
Anesthesia: General | Site: Breast | Laterality: Left

## 2020-09-13 MED ORDER — LACTATED RINGERS IV SOLN: INTRAVENOUS | Status: DC

## 2020-09-13 MED ORDER — OXYCODONE HCL 5 MG PO TABS
5.0000 mg | ORAL_TABLET | Freq: Once | ORAL | Status: AC
  Administered 2020-09-13: 5 mg via ORAL

## 2020-09-13 MED ORDER — CHLORHEXIDINE GLUCONATE CLOTH 2 % EX PADS: 6.0000 | MEDICATED_PAD | Freq: Once | CUTANEOUS | Status: DC

## 2020-09-13 MED ORDER — GABAPENTIN 300 MG PO CAPS
300.0000 mg | ORAL_CAPSULE | ORAL | Status: AC
  Administered 2020-09-13: 300 mg via ORAL

## 2020-09-13 MED ORDER — CEFAZOLIN SODIUM 1 G IJ SOLR
INTRAMUSCULAR | Status: AC
  Filled 2020-09-13: qty 20

## 2020-09-13 MED ORDER — FENTANYL CITRATE (PF) 100 MCG/2ML IJ SOLN
INTRAMUSCULAR | Status: DC | PRN
  Administered 2020-09-13: 50 ug via INTRAVENOUS
  Administered 2020-09-13 (×2): 25 ug via INTRAVENOUS
  Administered 2020-09-13: 50 ug via INTRAVENOUS

## 2020-09-13 MED ORDER — BUPIVACAINE-EPINEPHRINE 0.25% -1:200000 IJ SOLN
INTRAMUSCULAR | Status: DC | PRN
  Administered 2020-09-13: 30 mL

## 2020-09-13 MED ORDER — BUPIVACAINE-EPINEPHRINE (PF) 0.25% -1:200000 IJ SOLN
INTRAMUSCULAR | Status: AC
  Filled 2020-09-13: qty 30

## 2020-09-13 MED ORDER — DEXAMETHASONE SODIUM PHOSPHATE 10 MG/ML IJ SOLN
INTRAMUSCULAR | Status: AC
  Filled 2020-09-13: qty 1

## 2020-09-13 MED ORDER — ACETAMINOPHEN 500 MG PO TABS
1000.0000 mg | ORAL_TABLET | ORAL | Status: AC
  Administered 2020-09-13: 1000 mg via ORAL

## 2020-09-13 MED ORDER — ONDANSETRON HCL 4 MG/2ML IJ SOLN
INTRAMUSCULAR | Status: DC | PRN
  Administered 2020-09-13: 4 mg via INTRAVENOUS

## 2020-09-13 MED ORDER — MIDAZOLAM HCL 2 MG/2ML IJ SOLN
INTRAMUSCULAR | Status: AC
  Filled 2020-09-13: qty 2

## 2020-09-13 MED ORDER — TECHNETIUM TC 99M TILMANOCEPT KIT
1.0000 | PACK | Freq: Once | INTRAVENOUS | Status: AC | PRN
  Administered 2020-09-13: 1 via INTRADERMAL

## 2020-09-13 MED ORDER — LIDOCAINE 2% (20 MG/ML) 5 ML SYRINGE
INTRAMUSCULAR | Status: AC
  Filled 2020-09-13: qty 5

## 2020-09-13 MED ORDER — SODIUM CHLORIDE (PF) 0.9 % IJ SOLN
INTRAMUSCULAR | Status: AC
  Filled 2020-09-13: qty 10

## 2020-09-13 MED ORDER — ACETAMINOPHEN 500 MG PO TABS
ORAL_TABLET | ORAL | Status: AC
  Filled 2020-09-13: qty 2

## 2020-09-13 MED ORDER — GABAPENTIN 300 MG PO CAPS
ORAL_CAPSULE | ORAL | Status: AC
  Filled 2020-09-13: qty 1

## 2020-09-13 MED ORDER — HYDROMORPHONE HCL 1 MG/ML IJ SOLN
0.2500 mg | INTRAMUSCULAR | Status: DC | PRN
  Administered 2020-09-13 (×3): 0.5 mg via INTRAVENOUS

## 2020-09-13 MED ORDER — ONDANSETRON HCL 4 MG/2ML IJ SOLN
INTRAMUSCULAR | Status: AC
  Filled 2020-09-13: qty 2

## 2020-09-13 MED ORDER — CELECOXIB 200 MG PO CAPS
ORAL_CAPSULE | ORAL | Status: AC
  Filled 2020-09-13: qty 1

## 2020-09-13 MED ORDER — EPHEDRINE 5 MG/ML INJ
INTRAVENOUS | Status: AC
  Filled 2020-09-13: qty 10

## 2020-09-13 MED ORDER — ARTIFICIAL TEARS OPHTHALMIC OINT
TOPICAL_OINTMENT | OPHTHALMIC | Status: AC
  Filled 2020-09-13: qty 3.5

## 2020-09-13 MED ORDER — MIDAZOLAM HCL 2 MG/2ML IJ SOLN
2.0000 mg | Freq: Once | INTRAMUSCULAR | Status: AC
  Administered 2020-09-13: 1 mg via INTRAVENOUS

## 2020-09-13 MED ORDER — OXYCODONE HCL 5 MG PO TABS: 5.0000 mg | ORAL_TABLET | Freq: Four times a day (QID) | ORAL | 0 refills | Status: DC | PRN

## 2020-09-13 MED ORDER — METHYLENE BLUE 0.5 % INJ SOLN
INTRAVENOUS | Status: AC
  Filled 2020-09-13: qty 10

## 2020-09-13 MED ORDER — CEFAZOLIN SODIUM-DEXTROSE 2-4 GM/100ML-% IV SOLN
INTRAVENOUS | Status: AC
  Filled 2020-09-13: qty 100

## 2020-09-13 MED ORDER — PROPOFOL 10 MG/ML IV BOLUS
INTRAVENOUS | Status: DC | PRN
  Administered 2020-09-13: 200 mg via INTRAVENOUS

## 2020-09-13 MED ORDER — ROCURONIUM BROMIDE 10 MG/ML (PF) SYRINGE
PREFILLED_SYRINGE | INTRAVENOUS | Status: AC
  Filled 2020-09-13: qty 10

## 2020-09-13 MED ORDER — DEXAMETHASONE SODIUM PHOSPHATE 10 MG/ML IJ SOLN
INTRAMUSCULAR | Status: DC | PRN
  Administered 2020-09-13: 10 mg via INTRAVENOUS

## 2020-09-13 MED ORDER — CELECOXIB 200 MG PO CAPS
200.0000 mg | ORAL_CAPSULE | ORAL | Status: AC
  Administered 2020-09-13: 200 mg via ORAL

## 2020-09-13 MED ORDER — 0.9 % SODIUM CHLORIDE (POUR BTL) OPTIME
TOPICAL | Status: DC | PRN
  Administered 2020-09-13: 250 mL

## 2020-09-13 MED ORDER — FENTANYL CITRATE (PF) 100 MCG/2ML IJ SOLN
INTRAMUSCULAR | Status: AC
  Filled 2020-09-13: qty 2

## 2020-09-13 MED ORDER — KETOROLAC TROMETHAMINE 30 MG/ML IJ SOLN
INTRAMUSCULAR | Status: AC
  Filled 2020-09-13: qty 1

## 2020-09-13 MED ORDER — MIDAZOLAM HCL 2 MG/2ML IJ SOLN
INTRAMUSCULAR | Status: DC | PRN
  Administered 2020-09-13: 2 mg via INTRAVENOUS

## 2020-09-13 MED ORDER — HYDROMORPHONE HCL 1 MG/ML IJ SOLN
INTRAMUSCULAR | Status: AC
  Filled 2020-09-13: qty 0.5

## 2020-09-13 MED ORDER — OXYCODONE HCL 5 MG PO TABS
ORAL_TABLET | ORAL | Status: AC
  Filled 2020-09-13: qty 1

## 2020-09-13 MED ORDER — PROPOFOL 10 MG/ML IV BOLUS
INTRAVENOUS | Status: AC
  Filled 2020-09-13: qty 40

## 2020-09-13 MED ORDER — CEFAZOLIN SODIUM-DEXTROSE 2-4 GM/100ML-% IV SOLN
2.0000 g | INTRAVENOUS | Status: AC
  Administered 2020-09-13: 2 g via INTRAVENOUS

## 2020-09-13 MED ORDER — LIDOCAINE 2% (20 MG/ML) 5 ML SYRINGE
INTRAMUSCULAR | Status: DC | PRN
  Administered 2020-09-13: 70 mg via INTRAVENOUS

## 2020-09-13 SURGICAL SUPPLY — 45 items
APPLIER CLIP 9.375 MED OPEN (MISCELLANEOUS) ×2
BINDER BREAST LRG (GAUZE/BANDAGES/DRESSINGS) ×2 IMPLANT
BLADE SURG 15 STRL LF DISP TIS (BLADE) ×1 IMPLANT
BLADE SURG 15 STRL SS (BLADE) ×2
CANISTER SUC SOCK COL 7IN (MISCELLANEOUS) IMPLANT
CANISTER SUCT 1200ML W/VALVE (MISCELLANEOUS) IMPLANT
CHLORAPREP W/TINT 26 (MISCELLANEOUS) ×2 IMPLANT
CLIP APPLIE 9.375 MED OPEN (MISCELLANEOUS) ×1 IMPLANT
COVER BACK TABLE 60X90IN (DRAPES) ×2 IMPLANT
COVER MAYO STAND STRL (DRAPES) ×2 IMPLANT
COVER PROBE W GEL 5X96 (DRAPES) ×2 IMPLANT
COVER WAND RF STERILE (DRAPES) IMPLANT
DECANTER SPIKE VIAL GLASS SM (MISCELLANEOUS) IMPLANT
DERMABOND ADVANCED (GAUZE/BANDAGES/DRESSINGS) ×2
DERMABOND ADVANCED .7 DNX12 (GAUZE/BANDAGES/DRESSINGS) ×2 IMPLANT
DRAPE LAPAROSCOPIC ABDOMINAL (DRAPES) ×2 IMPLANT
DRAPE UTILITY XL STRL (DRAPES) ×2 IMPLANT
ELECT COATED BLADE 2.86 ST (ELECTRODE) ×2 IMPLANT
ELECT REM PT RETURN 9FT ADLT (ELECTROSURGICAL) ×2
ELECTRODE REM PT RTRN 9FT ADLT (ELECTROSURGICAL) ×1 IMPLANT
GLOVE BIO SURGEON STRL SZ7.5 (GLOVE) ×4 IMPLANT
GLOVE BIOGEL PI IND STRL 7.0 (GLOVE) ×2 IMPLANT
GLOVE BIOGEL PI INDICATOR 7.0 (GLOVE) ×2
GLOVE SURG SS PI 7.0 STRL IVOR (GLOVE) ×2 IMPLANT
GOWN STRL REUS W/ TWL LRG LVL3 (GOWN DISPOSABLE) ×2 IMPLANT
GOWN STRL REUS W/TWL LRG LVL3 (GOWN DISPOSABLE) ×4
ILLUMINATOR WAVEGUIDE N/F (MISCELLANEOUS) IMPLANT
KIT MARKER MARGIN INK (KITS) ×2 IMPLANT
LIGHT WAVEGUIDE WIDE FLAT (MISCELLANEOUS) IMPLANT
NDL SAFETY ECLIPSE 18X1.5 (NEEDLE) IMPLANT
NEEDLE HYPO 18GX1.5 SHARP (NEEDLE)
NEEDLE HYPO 25X1 1.5 SAFETY (NEEDLE) ×2 IMPLANT
NS IRRIG 1000ML POUR BTL (IV SOLUTION) ×2 IMPLANT
PACK BASIN DAY SURGERY FS (CUSTOM PROCEDURE TRAY) ×2 IMPLANT
PENCIL SMOKE EVACUATOR (MISCELLANEOUS) ×2 IMPLANT
SLEEVE SCD COMPRESS KNEE MED (MISCELLANEOUS) ×2 IMPLANT
SPONGE LAP 18X18 RF (DISPOSABLE) ×2 IMPLANT
SUT MON AB 4-0 PC3 18 (SUTURE) ×4 IMPLANT
SUT SILK 2 0 SH (SUTURE) IMPLANT
SUT VICRYL 3-0 CR8 SH (SUTURE) ×2 IMPLANT
SYR CONTROL 10ML LL (SYRINGE) ×2 IMPLANT
TOWEL GREEN STERILE FF (TOWEL DISPOSABLE) ×2 IMPLANT
TRAY FAXITRON CT DISP (TRAY / TRAY PROCEDURE) ×2 IMPLANT
TUBE CONNECTING 20X1/4 (TUBING) IMPLANT
YANKAUER SUCT BULB TIP NO VENT (SUCTIONS) IMPLANT

## 2020-09-13 NOTE — Discharge Instructions (Signed)
No Tylenol or ibuprofen until after 1240pm today if needed, next dose of oxycodone after 5pm.  Post Anesthesia Home Care Instructions  Activity: Get plenty of rest for the remainder of the day. A responsible individual must stay with you for 24 hours following the procedure.  For the next 24 hours, DO NOT: -Drive a car -Paediatric nurse -Drink alcoholic beverages -Take any medication unless instructed by your physician -Make any legal decisions or sign important papers.  Meals: Start with liquid foods such as gelatin or soup. Progress to regular foods as tolerated. Avoid greasy, spicy, heavy foods. If nausea and/or vomiting occur, drink only clear liquids until the nausea and/or vomiting subsides. Call your physician if vomiting continues.  Special Instructions/Symptoms: Your throat may feel dry or sore from the anesthesia or the breathing tube placed in your throat during surgery. If this causes discomfort, gargle with warm salt water. The discomfort should disappear within 24 hours.  If you had a scopolamine patch placed behind your ear for the management of post- operative nausea and/or vomiting:  1. The medication in the patch is effective for 72 hours, after which it should be removed.  Wrap patch in a tissue and discard in the trash. Wash hands thoroughly with soap and water. 2. You may remove the patch earlier than 72 hours if you experience unpleasant side effects which may include dry mouth, dizziness or visual disturbances. 3. Avoid touching the patch. Wash your hands with soap and water after contact with the patch.

## 2020-09-13 NOTE — Interval H&P Note (Signed)
History and Physical Interval Note:  09/13/2020 8:03 AM  Lydia Collins  has presented today for surgery, with the diagnosis of LEFT BREAST CANCER.  The various methods of treatment have been discussed with the patient and family. After consideration of risks, benefits and other options for treatment, the patient has consented to  Procedure(s): LEFT BREAST LUMPECTOMY WITH RADIOACTIVE SEED AND SENTINEL LYMPH NODE BIOPSY (Left) as a surgical intervention.  The patient's history has been reviewed, patient examined, no change in status, stable for surgery.  I have reviewed the patient's chart and labs.  Questions were answered to the patient's satisfaction.     Autumn Messing III

## 2020-09-13 NOTE — Op Note (Signed)
09/13/2020  9:36 AM  PATIENT:  Lydia Collins  55 y.o. female  PRE-OPERATIVE DIAGNOSIS:  LEFT BREAST CANCER  POST-OPERATIVE DIAGNOSIS:  LEFT BREAST CANCER  PROCEDURE:  Procedure(s): LEFT BREAST LUMPECTOMY WITH RADIOACTIVE SEED LOCALIZATION AND DEEP LEFT AXILLARY SENTINEL LYMPH NODE BIOPSY (Left)  SURGEON:  Surgeon(s) and Role:    * Jovita Kussmaul, MD - Primary  PHYSICIAN ASSISTANT:   ASSISTANTS: none   ANESTHESIA:   local and general  EBL:  minimal   BLOOD ADMINISTERED:none  DRAINS: none   LOCAL MEDICATIONS USED:  MARCAINE     SPECIMEN:  Source of Specimen:  left breast tissue and sentinel nodes x 2  DISPOSITION OF SPECIMEN:  PATHOLOGY  COUNTS:  YES  TOURNIQUET:  * No tourniquets in log *  DICTATION: .Dragon Dictation   After informed consent was obtained the patient was brought to the operating room and placed in the supine position on the operating table.  After adequate induction of general anesthesia the patient's left chest, breast, and axillary areas were prepped with ChloraPrep, allowed to dry, draped in usual sterile manner.  An appropriate timeout was performed.  Previously an I-125 seed was placed in the lower outer quadrant to mark in the area of breast cancer.  Also earlier in the day the patient underwent injection of 1 mCi of technetium sulfur colloid in the subareolar position on the left.  The neoprobe was initially set to technetium and there was a good signal in the left axilla.  This area was infiltrated with quarter percent Marcaine.  A small transversely oriented incision was made in the axilla overlying the area of radioactivity with a 15 blade knife.  The incision was carried through the skin and subcutaneous tissue sharply with the electrocautery.  Dissection was then carried into the deep left axillary space.  Blunt hemostat dissection was directed by the neoprobe.  I was able to identify 2 hot lymph nodes.  These lymph nodes were excised sharply with  the electrocautery and the surrounding small vessels and lymphatics were controlled with clips.  Ex vivo counts on these nodes ranged from 50 to 200.  No other hot or palpable nodes were identified in the left axilla.  Hemostasis was achieved using the Bovie electrocautery.  The deep layer of the wound was then closed with interrupted 3-0 Vicryl stitches.  The skin was closed with a running 4-0 Monocryl subcuticular stitch.  Attention was then turned to the left breast.  The neoprobe was set to I-125 in the area of radioactivity was readily identified.  The area around this was infiltrated with core percent Marcaine.  A curvilinear incision was made along the lower outer inframammary fold with a 15 blade knife.  The dissection was then carried through the skin and subcutaneous tissue sharply with electrocautery until the dissection reached the chest wall.  Breast tissue was then separated from the chest wall muscle sharply with electrocautery.  Once the dissection was well beyond the area of the cancer then the dissection was carried anteriorly between the breast tissue and the skin and subcutaneous fat.  Once this dissection was well beyond the area of the cancer then a circular portion of breast tissue was excised sharply with the electrocautery around the radioactive seed while checking the area of radioactivity frequently.  Once the specimen was removed it was oriented with the appropriate paint colors.  A specimen radiograph was obtained that showed the clip and seed to be near the center of the  specimen.  The specimen was then sent to pathology for further evaluation.  Hemostasis was achieved using the Bovie electrocautery.  The wound was irrigated with saline and infiltrated with more quarter percent Marcaine.  The cavity was marked with clips.  The deep layer of the wound was then closed with layers of interrupted 3-0 Vicryl stitches.  The skin was closed with a running 4-0 Monocryl subcuticular stitch.   Dermabond dressings were applied.  The patient tolerated the procedure well.  At the end of the case all needle sponge and instrument counts were correct.  The patient was then awakened and taken recovery in stable condition.  PLAN OF CARE: Discharge to home after PACU  PATIENT DISPOSITION:  PACU - hemodynamically stable.   Delay start of Pharmacological VTE agent (>24hrs) due to surgical blood loss or risk of bleeding: not applicable

## 2020-09-13 NOTE — Anesthesia Preprocedure Evaluation (Signed)
Anesthesia Evaluation  Patient identified by MRN, date of birth, ID band Patient awake    Reviewed: Allergy & Precautions, NPO status , Patient's Chart, lab work & pertinent test results  Airway Mallampati: II  TM Distance: >3 FB     Dental   Pulmonary shortness of breath, Current Smoker and Patient abstained from smoking.,    breath sounds clear to auscultation       Cardiovascular hypertension,  Rhythm:Regular Rate:Normal     Neuro/Psych Anxiety    GI/Hepatic negative GI ROS, Neg liver ROS,   Endo/Other  negative endocrine ROS  Renal/GU negative Renal ROS     Musculoskeletal   Abdominal   Peds  Hematology   Anesthesia Other Findings   Reproductive/Obstetrics                             Anesthesia Physical Anesthesia Plan  ASA: III  Anesthesia Plan: General   Post-op Pain Management:    Induction: Intravenous  PONV Risk Score and Plan: 2 and Ondansetron, Dexamethasone and Midazolam  Airway Management Planned: LMA  Additional Equipment:   Intra-op Plan:   Post-operative Plan: Extubation in OR  Informed Consent: I have reviewed the patients History and Physical, chart, labs and discussed the procedure including the risks, benefits and alternatives for the proposed anesthesia with the patient or authorized representative who has indicated his/her understanding and acceptance.     Dental advisory given  Plan Discussed with: CRNA and Anesthesiologist  Anesthesia Plan Comments:         Anesthesia Quick Evaluation

## 2020-09-13 NOTE — Progress Notes (Signed)
Nuc med injections completed. Patient tolerated well.   

## 2020-09-13 NOTE — H&P (Signed)
Lydia Collins  Location: [facility] Surgery Patient #: [id number] DOB: 1966-01-28 Single / Language: Cleophus Molt / Race: White Female   History of Present Illness  The patient is a 55 year old female who presents with breast cancer. We are asked to see the patient in consultation by Dr. Hassan Rowan to evaluate her for a new left breast cancer. The patient is a 55 year old white female who recently went for a routine screening mammogram. At that time she was found to have 2 small 8 mm masses in the upper outer quadrant of the left breast and one 1.9 cm mass in the lower outer quadrant of the left breast. The lymph nodes looked normal. 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 The lower mass was found to be an invasive ductal cancer that was grade 1-2. The tumor markers have not been reported yet. She does smoke about 1 pack of cigarettes a day. She has a family history of colon cancer and bone cancer. She also has high blood pressure and anxiety.   Allergies No Known Drug Allergies   Medication History  Chlorthalidone (Oral) Specific strength unknown - Active. Tylenol with Codeine #3 (Oral) Specific strength unknown - Active. Medications Reconciled    Review of Systems  General Not Present- Appetite Loss, Chills, Fatigue, Fever, Night Sweats, Weight Gain and Weight Loss. Note: All other systems negative (unless as noted in HPI & included Review of Systems) Skin Not Present- Change in Wart/Mole, Dryness, Hives, Jaundice, New Lesions, Non-Healing Wounds, Rash and Ulcer. HEENT Not Present- Earache, Hearing Loss, Hoarseness, Nose Bleed, Oral Ulcers, Ringing in the Ears, Seasonal Allergies, Sinus Pain, Sore Throat, Visual Disturbances, Wears glasses/contact lenses and Yellow Eyes. Respiratory Not Present- Bloody sputum, Chronic Cough, Difficulty Breathing, Snoring and Wheezing. Breast Not Present- Breast Mass, Breast Pain, Nipple Discharge  and Skin Changes. Cardiovascular Not Present- Chest Pain, Difficulty Breathing Lying Down, Leg Cramps, Palpitations, Rapid Heart Rate, Shortness of Breath and Swelling of Extremities. Gastrointestinal Not Present- Abdominal Pain, Bloating, Bloody Stool, Change in Bowel Habits, Chronic diarrhea, Constipation, Difficulty Swallowing, Excessive gas, Gets full quickly at meals, Hemorrhoids, Indigestion, Nausea, Rectal Pain and Vomiting. Female Genitourinary Not Present- Frequency, Nocturia, Painful Urination, Pelvic Pain and Urgency. Musculoskeletal Not Present- Back Pain, Joint Pain, Joint Stiffness, Muscle Pain, Muscle Weakness and Swelling of Extremities. Neurological Not Present- Decreased Memory, Fainting, Headaches, Numbness, Seizures, Tingling, Tremor, Trouble walking and Weakness. Psychiatric Present- Anxiety. Not Present- Bipolar, Change in Sleep Pattern, Depression, Fearful and Frequent crying. Endocrine Not Present- Cold Intolerance, Excessive Hunger, Hair Changes, Heat Intolerance, Hot flashes and New Diabetes. Hematology Not Present- Easy Bruising, Excessive bleeding, Gland problems, HIV and Persistent Infections.  Vitals  Weight: 179.25 lb Height: 64in Body Surface Area: 1.87 m Body Mass Index: 30.77 kg/m  Temp.: 97.33F  Pulse: 130 (Regular)  BP: 132/84(Sitting, Left Arm, Standard)       Physical Exam General Mental Status-Alert. General Appearance-Consistent with stated age. Hydration-Well hydrated. Voice-Normal.  Head and Neck Head-normocephalic, atraumatic with no lesions or palpable masses. Trachea-midline. Thyroid Gland Characteristics - normal size and consistency.  Eye Eyeball - Bilateral-Extraocular movements intact. Sclera/Conjunctiva - Bilateral-No scleral icterus.  Chest and Lung Exam Chest and lung exam reveals -quiet, even and easy respiratory effort with no use of accessory muscles and on auscultation, normal breath sounds,  no adventitious sounds and normal vocal resonance. Inspection Chest Wall - Normal. Back - normal.  Breast Note: There is no palpable mass in  either breast. There is no palpable axillary, supraclavicular, or cervical lymphadenopathy.   Cardiovascular Cardiovascular examination reveals -normal heart sounds, regular rate and rhythm with no murmurs and normal pedal pulses bilaterally.  Abdomen Inspection Inspection of the abdomen reveals - No Hernias. Skin - Scar - no surgical scars. Palpation/Percussion Palpation and Percussion of the abdomen reveal - Soft, Non Tender, No Rebound tenderness, No Rigidity (guarding) and No hepatosplenomegaly. Auscultation Auscultation of the abdomen reveals - Bowel sounds normal.  Neurologic Neurologic evaluation reveals -alert and oriented x 3 with no impairment of recent or remote memory. Mental Status-Normal.  Musculoskeletal Normal Exam - Left-Upper Extremity Strength Normal and Lower Extremity Strength Normal. Normal Exam - Right-Upper Extremity Strength Normal and Lower Extremity Strength Normal.  Lymphatic Head & Neck  General Head & Neck Lymphatics: Bilateral - Description - Normal. Axillary  General Axillary Region: Bilateral - Description - Normal. Tenderness - Non Tender. Femoral & Inguinal  Generalized Femoral & Inguinal Lymphatics: Bilateral - Description - Normal. Tenderness - Non Tender.    Assessment & Plan MALIGNANT NEOPLASM OF LOWER-OUTER QUADRANT OF LEFT FEMALE BREAST, UNSPECIFIED ESTROGEN RECEPTOR STATUS (C50.512) Impression: The patient appears to have a 1.9 cm cancer in the lower outer quadrant of the left breast with clinically negative nodes. I have discussed with her in detail the different options for treatment. At this point we are waiting for her tumor markers to come back. If they are favorable than she is in favor of breast conservation and sentinel mapping. I have discussed with her in detail the risks  and benefits of the operation as well as the technical aspects including the use of a radioactive seed cauterization and she understands and wishes to proceed. If the cancer looks unfavorable then she may require neoadjuvant chemotherapy in which case a Port-A-Cath would need to be placed. I have also discussed this with her in detail. I will go ahead and refer her to medical and radiation oncology. We will call her with the results of her tumor markers once they've been reported. We will then make a plan for definitive treatment. This patient encounter took 60 minutes today to perform the following: take history, perform exam, review outside records, interpret imaging, counsel the patient on their diagnosis and document encounter, findings & plan in the EHR Current Plans Referred to Oncology, for evaluation and follow up (Oncology). Routine. Referred to Physical Therapy, for evaluation and follow up (Physical Therapy). Routine.

## 2020-09-13 NOTE — Transfer of Care (Signed)
Immediate Anesthesia Transfer of Care Note  Patient: Lydia Collins  Procedure(s) Performed: Procedure(s) (LRB): LEFT BREAST LUMPECTOMY WITH RADIOACTIVE SEED AND SENTINEL LYMPH NODE BIOPSY (Left)  Patient Location: PACU  Anesthesia Type: General  Level of Consciousness: awake, alert  and oriented  Airway & Oxygen Therapy: Patient Spontanous Breathing and Patient connected to nasal cannula oxygen  Post-op Assessment: Report given to PACU RN and Post -op Vital signs reviewed and stable  Post vital signs: Reviewed and stable  Complications: No apparent anesthesia complications  Last Vitals:  Vitals Value Taken Time  BP 141/93 09/13/20 0949  Temp    Pulse 100 09/13/20 0951  Resp 18 09/13/20 0951  SpO2 99 % 09/13/20 0951  Vitals shown include unvalidated device data.  Last Pain:  Vitals:   09/13/20 0637  PainSc: 8          Complications: No complications documented.

## 2020-09-13 NOTE — Anesthesia Procedure Notes (Signed)
Procedure Name: LMA Insertion Date/Time: 09/13/2020 8:22 AM Performed by: Mechele Claude, CRNA Pre-anesthesia Checklist: Patient identified, Emergency Drugs available, Suction available and Patient being monitored Patient Re-evaluated:Patient Re-evaluated prior to induction Oxygen Delivery Method: Circle system utilized Preoxygenation: Pre-oxygenation with 100% oxygen Induction Type: IV induction Ventilation: Mask ventilation without difficulty LMA: LMA inserted LMA Size: 4.0 Number of attempts: 1 Airway Equipment and Method: Bite block Placement Confirmation: positive ETCO2 Tube secured with: Tape Dental Injury: Teeth and Oropharynx as per pre-operative assessment

## 2020-09-13 NOTE — Anesthesia Postprocedure Evaluation (Signed)
Anesthesia Post Note  Patient: Lydia Collins  Procedure(s) Performed: LEFT BREAST LUMPECTOMY WITH RADIOACTIVE SEED AND SENTINEL LYMPH NODE BIOPSY (Left Breast)     Patient location during evaluation: PACU Anesthesia Type: General Level of consciousness: awake Pain management: pain level controlled Vital Signs Assessment: post-procedure vital signs reviewed and stable Respiratory status: spontaneous breathing Cardiovascular status: stable Postop Assessment: no apparent nausea or vomiting Anesthetic complications: no   No complications documented.  Last Vitals:  Vitals:   09/13/20 1030 09/13/20 1100  BP: 132/89 120/83  Pulse: 98 95  Resp: 20 18  Temp:  (!) 36.1 C  SpO2: 97% 97%    Last Pain:  Vitals:   09/13/20 1122  PainSc: 3                  Marchell Froman

## 2020-09-16 ENCOUNTER — Encounter (HOSPITAL_BASED_OUTPATIENT_CLINIC_OR_DEPARTMENT_OTHER): Payer: Self-pay | Admitting: General Surgery

## 2020-09-17 LAB — SURGICAL PATHOLOGY

## 2020-09-18 ENCOUNTER — Telehealth: Payer: Self-pay | Admitting: *Deleted

## 2020-09-18 ENCOUNTER — Encounter: Payer: Self-pay | Admitting: *Deleted

## 2020-09-18 NOTE — Telephone Encounter (Signed)
Received order for Mammaprint Testing. Requisition faxed to pathology and [facility]

## 2020-09-24 NOTE — Progress Notes (Incomplete)
Patient Care Team: Patient, [name] as PCP - General (General Practice) [name], RN as Oncology Nurse Navigator [name], RN as Oncology Nurse Navigator  DIAGNOSIS: No diagnosis found.  SUMMARY OF ONCOLOGIC HISTORY: Oncology History  Malignant neoplasm of lower-outer quadrant of left female breast ([place])  07/23/2020 Initial Diagnosis   Screening mammogram showed a left breast distortion. Diagnostic mammogram showed a mass at the 4 o'clock position in the left breast, 0.8cm, and 2 adjacent masses in the left breast at the 2 o'clock position, 0.7cm and 0.6cm, and no left axillary lymphadenopathy. Biopsy showed no evidence of malignancy at the 2 o'clock position, and IDC at the 4 o'clock position, grade 1, HER-2 negative (1+), ER/PR+ 95%, Ki67 5%.    09/13/2020 Surgery   Left lumpectomy [name]): invasive lobular carcinoma, 1.5cm, 2/3 left axillary lymph nodes positive for metastatic carcinoma.     CHIEF COMPLIANT: Follow-up s/p lumpectomy   INTERVAL HISTORY: Lydia Collins is a 55 y.o. with above-mentioned history of left breast cancer. She underwent a left lumpectomy on 09/13/20 with Dr. [name] for which pathology showed invasive lobular carcinoma, 1.5cm, 2/3 left axillary lymph nodes positive for metastatic carcinoma. She presents to the clinic today to review the pathology report and discuss further treatment.   ALLERGIES:  has No Known Allergies.  MEDICATIONS:  Current Outpatient Medications  Medication Sig Dispense Refill  . ALPRAZolam (XANAX) 0.5 MG tablet Take 0.5 mg by mouth 3 (three) times daily as needed for anxiety.    . chlorthalidone (HYGROTON) 25 MG tablet Take 25 mg by mouth every morning.    Marland Kitchen oxyCODONE (OXY IR/ROXICODONE) 5 MG immediate release tablet Take 1-2 tablets (5-10 mg total) by mouth every 6 (six) hours as needed for moderate pain, severe pain or breakthrough pain. 10 tablet 0   No current facility-administered medications for this visit.     PHYSICAL EXAMINATION: ECOG PERFORMANCE STATUS: {CHL ONC ECOG PS:[phone]}  There were no vitals filed for this visit. There were no vitals filed for this visit.  LABORATORY DATA:  I have reviewed the data as listed CMP Latest Ref Rng & Units 08/20/2020 05/03/2012 02/15/2007  Glucose 70 - 99 mg/dL 98 110(H) 94  BUN 6 - 20 mg/dL '10 19 6  ' Creatinine 0.44 - 1.00 mg/dL 1.00 0.83 0.60  Sodium 135 - 145 mmol/L 140 136 143  Potassium 3.5 - 5.1 mmol/L 3.8 4.1 3.4(L)  Chloride 98 - 111 mmol/L 101 103 114(H)  CO2 22 - 32 mmol/L '29 22 20  ' Calcium 8.9 - 10.3 mg/dL 9.2 9.9 8.4  Total Protein - - - 6.7  Total Bilirubin - - - 0.4  Alkaline Phos - - - 58  AST - - - 15  ALT - - - 11    Lab Results  Component Value Date   WBC 15.9 (H) 05/03/2012   HGB 14.8 05/03/2012   HCT 43.0 05/03/2012   MCV 91.3 05/03/2012   PLT 283 05/03/2012   NEUTROABS 12.0 (H) 05/03/2012    ASSESSMENT & PLAN:  No problem-specific Assessment & Plan notes found for this encounter.    No orders of the defined types were placed in this encounter.  The patient has a good understanding of the overall plan. she agrees with it. she will call with any problems that may develop before the next visit here.  Total time spent: *** mins including face to face time and time spent for planning, charting and coordination of care  Nicholas Lose, MD 09/24/2020  Julious Oka Dorshimer, am acting as scribe for Dr. Nicholas Lose.  {insert scribe attestation}

## 2020-09-25 ENCOUNTER — Inpatient Hospital Stay: Payer: Medicaid Other | Attending: Hematology and Oncology | Admitting: Hematology and Oncology

## 2020-09-25 DIAGNOSIS — Z170 Estrogen receptor positive status [ER+]: Secondary | ICD-10-CM | POA: Insufficient documentation

## 2020-09-25 DIAGNOSIS — C773 Secondary and unspecified malignant neoplasm of axilla and upper limb lymph nodes: Secondary | ICD-10-CM | POA: Insufficient documentation

## 2020-09-25 DIAGNOSIS — C50512 Malignant neoplasm of lower-outer quadrant of left female breast: Secondary | ICD-10-CM | POA: Insufficient documentation

## 2020-09-25 NOTE — Assessment & Plan Note (Deleted)
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  ER/PR 95%, Ki-67 5%, HER2 negative 1+ by IHC positive lateral margin  Pathology counseling: I discussed the final pathology report of the patient provided  a copy of this report. I discussed the margins as well as lymph node surgeries. We also discussed the final staging along with previously performed ER/PR and HER-2/neu testing.  Treatment plan: 1.  MammaPrint testing to determine if she would benefit from systemic chemotherapy 2. lateral margin will need to be cleared 3.  Adjuvant radiation therapy 4.  Follow-up adjuvant antiestrogen therapy  Return to clinic based upon MammaPrint test results.

## 2020-09-27 ENCOUNTER — Encounter: Payer: Self-pay | Admitting: Hematology and Oncology

## 2020-09-27 ENCOUNTER — Telehealth: Payer: Self-pay | Admitting: *Deleted

## 2020-09-27 ENCOUNTER — Encounter: Payer: Self-pay | Admitting: *Deleted

## 2020-09-27 DIAGNOSIS — C50512 Malignant neoplasm of lower-outer quadrant of left female breast: Secondary | ICD-10-CM

## 2020-09-27 NOTE — Telephone Encounter (Signed)
Attempted x2 to reach patient with mammaprint results of low risk. Her phone was not accepting calls. Referral placed back to Dr. Isidore Moos.

## 2020-10-04 ENCOUNTER — Encounter: Payer: Self-pay | Admitting: *Deleted

## 2020-10-06 NOTE — Progress Notes (Signed)
Patient Care Team: Patient, [name] as PCP - General (General Practice) Lydia Kaufmann, RN as Oncology Nurse Navigator Lydia Germany, RN as Oncology Nurse Navigator  DIAGNOSIS:    ICD-10-CM   1. 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 Diagnostic mammogram showed a mass at the 4 o'clock position in the left breast, 0.8cm, and 2 adjacent masses in the left breast at the 2 o'clock position, 0.7cm and 0.6cm, and no left axillary lymphadenopathy. Biopsy showed no evidence of malignancy at the 2 o'clock position, and IDC at the 4 o'clock position, grade 1, HER-2 negative (1+), ER/PR+ 95%, Ki67 5%.    09/13/2020 Surgery   Left lumpectomy Lydia Collins): invasive lobular carcinoma, 1.5cm, 2 left axillary lymph nodes positive for metastatic carcinoma.     CHIEF COMPLIANT: Follow-up s/p lumpectomy   INTERVAL HISTORY: Lydia Collins is a 55 y.o. with above-mentioned history of left breast cancer. She underwent a left lumpectomy on 09/13/20 with Dr. Marlou Collins for which pathology showed invasive lobular carcinoma, 1.5cm, carcinoma focally present at lateral margin, 2 axillary lymph nodes positive for carcinoma. She presents to the clinic today for follow-up to discuss further treatment.  We have have been trying to contact her for a while but apparently we had the wrong number where 1 number was off.  She finally contacted [place] and we were able to see her today.  ALLERGIES:  has No Known Allergies.  MEDICATIONS:  Current Outpatient Medications  Medication Sig Dispense Refill   gabapentin (NEURONTIN) 100 MG capsule Take 3 capsules (300 mg total) by mouth at bedtime. 90 capsule 3    ALPRAZolam (XANAX) 0.5 MG tablet Take 0.5 mg by mouth 3 (three) times daily as needed for anxiety.     chlorthalidone (HYGROTON) 25 MG tablet Take 25 mg by mouth every morning.     oxyCODONE (OXY IR/ROXICODONE) 5 MG immediate release tablet Take 1-2 tablets (5-10 mg total) by mouth every 6 (six) hours as needed for moderate pain, severe pain or breakthrough pain. 10 tablet 0   No current facility-administered medications for this visit.    PHYSICAL EXAMINATION: ECOG PERFORMANCE STATUS: 1 - Symptomatic but completely ambulatory  Vitals:   10/07/20 0928  BP: (!) 149/97  Pulse: 97  Resp: 18  Temp: 97.7 F (36.5 C)  SpO2: 98%   Filed Weights   10/07/20 0928  Weight: 175 lb 14.4 oz (79.8 kg)    LABORATORY DATA:  I have reviewed the data as listed CMP Latest Ref Rng & Units 08/20/2020 05/03/2012 02/15/2007  Glucose 70 - 99 mg/dL 98 110(H) 94  BUN 6 - 20 mg/dL '10 19 6  ' Creatinine 0.44 - 1.00 mg/dL 1.00 0.83 0.60  Sodium 135 - 145 mmol/L 140 136 143  Potassium 3.5 - 5.1 mmol/L 3.8 4.1 3.4(L)  Chloride 98 - 111 mmol/L 101 103 114(H)  CO2 22 - 32 mmol/L '29 22 20  ' Calcium 8.9 - 10.3 mg/dL 9.2 9.9 8.4  Total Protein - - - 6.7  Total Bilirubin - - - 0.4  Alkaline Phos - - - 58  AST - - - 15  ALT - - - 11    Lab Results  Component  Value Date   WBC 15.9 (H) 05/03/2012   HGB 14.8 05/03/2012   HCT 43.0 05/03/2012   MCV 91.3 05/03/2012   PLT 283 05/03/2012   NEUTROABS 12.0 (H) 05/03/2012    ASSESSMENT & PLAN:  Malignant neoplasm of lower-outer quadrant of left breast of female, estrogen receptor positive ([place]) 09/13/2020:Left lumpectomy Lydia Collins): Grade 1 invasive lobular carcinoma, 1.5cm, 2/3 left axillary lymph nodes positive for metastatic carcinoma.  ER/PR 95%, Ki-67 5%, HER2 negative 1+ by IHC positive lateral margin  Pathology counseling: I discussed the final pathology report of the patient provided  a copy of this report. I discussed the margins as well as lymph node  surgeries. We also discussed the final staging along with previously performed ER/PR and HER-2/neu testing. MammaPrint: Low risk  Treatment plan: 1.  lateral margin will need to be cleared, discussion regarding additional lymph node excision patient has an appointment tomorrow to see Dr. Marlou Collins. 2.  Adjuvant radiation therapy 3.  Follow-up adjuvant antiestrogen therapy  I explained to her that if the number of positive lymph nodes exceeds 4 or more then she will need systemic chemotherapy irrespective of the MammaPrint test result.    No orders of the defined types were placed in this encounter.  The patient has a good understanding of the overall plan. she agrees with it. she will call with any problems that may develop before the next visit here.  Total time spent: 30 mins including face to face time and time spent for planning, charting and coordination of care  Lydia Lose, MD 10/07/2020  I, Lydia Collins, am acting as scribe for Dr. Nicholas Collins.  I have reviewed the above documentation for accuracy and completeness, and I agree with the above.

## 2020-10-07 ENCOUNTER — Inpatient Hospital Stay (HOSPITAL_BASED_OUTPATIENT_CLINIC_OR_DEPARTMENT_OTHER): Payer: Medicaid Other | Admitting: Hematology and Oncology

## 2020-10-07 ENCOUNTER — Other Ambulatory Visit: Payer: Self-pay

## 2020-10-07 DIAGNOSIS — Z170 Estrogen receptor positive status [ER+]: Secondary | ICD-10-CM | POA: Diagnosis not present

## 2020-10-07 DIAGNOSIS — C50512 Malignant neoplasm of lower-outer quadrant of left female breast: Secondary | ICD-10-CM | POA: Diagnosis present

## 2020-10-07 DIAGNOSIS — C773 Secondary and unspecified malignant neoplasm of axilla and upper limb lymph nodes: Secondary | ICD-10-CM | POA: Diagnosis not present

## 2020-10-07 MED ORDER — GABAPENTIN 100 MG PO CAPS: 300.0000 mg | ORAL_CAPSULE | Freq: Every day | ORAL | 3 refills | Status: DC

## 2020-10-07 NOTE — Assessment & Plan Note (Signed)
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  ER/PR 95%, Ki-67 5%, HER2 negative 1+ by IHC positive lateral margin  Pathology counseling: I discussed the final pathology report of the patient provided  a copy of this report. I discussed the margins as well as lymph node surgeries. We also discussed the final staging along with previously performed ER/PR and HER-2/neu testing. MammaPrint: Low risk  Treatment plan: 1.  lateral margin will need to be cleared, discussion regarding additional lymph node excision 2.  Adjuvant radiation therapy 3.  Follow-up adjuvant antiestrogen therapy

## 2020-10-08 ENCOUNTER — Ambulatory Visit: Payer: Self-pay | Admitting: General Surgery

## 2020-10-15 ENCOUNTER — Ambulatory Visit: Payer: Medicaid Other | Admitting: Radiation Oncology

## 2020-10-15 ENCOUNTER — Ambulatory Visit: Payer: Medicaid Other

## 2020-10-16 ENCOUNTER — Ambulatory Visit: Payer: Medicaid Other | Admitting: Radiation Oncology

## 2020-10-22 ENCOUNTER — Telehealth: Payer: Self-pay | Admitting: *Deleted

## 2020-10-22 NOTE — Telephone Encounter (Signed)
Received call from pt requesting pain medication refill on a script originally prescribed by Dr. [name].  Per MD pt needing to contact [facility] and speak with Dr. [name] regarding refills.  Pt verbalized understanding.

## 2020-10-23 ENCOUNTER — Encounter: Payer: Self-pay | Admitting: *Deleted

## 2020-10-25 ENCOUNTER — Encounter: Payer: Self-pay | Admitting: *Deleted

## 2020-10-29 ENCOUNTER — Ambulatory Visit: Payer: Medicaid Other

## 2020-10-29 ENCOUNTER — Ambulatory Visit: Payer: Medicaid Other | Admitting: Radiation Oncology

## 2020-11-04 ENCOUNTER — Ambulatory Visit: Payer: Medicaid Other | Admitting: Radiation Oncology

## 2020-11-05 ENCOUNTER — Other Ambulatory Visit: Payer: Self-pay

## 2020-11-05 ENCOUNTER — Encounter (HOSPITAL_BASED_OUTPATIENT_CLINIC_OR_DEPARTMENT_OTHER)
Admission: RE | Admit: 2020-11-05 | Discharge: 2020-11-05 | Disposition: A | Discharge status: Home / Self Care | Payer: Medicaid Other | Source: Ambulatory Visit | Attending: General Surgery | Admitting: General Surgery

## 2020-11-05 ENCOUNTER — Other Ambulatory Visit (HOSPITAL_COMMUNITY)
Admission: RE | Admit: 2020-11-05 | Discharge: 2020-11-05 | Disposition: A | Discharge status: Home / Self Care | Payer: Medicaid Other | Source: Ambulatory Visit | Attending: General Surgery | Admitting: General Surgery

## 2020-11-05 ENCOUNTER — Encounter (HOSPITAL_BASED_OUTPATIENT_CLINIC_OR_DEPARTMENT_OTHER): Payer: Self-pay | Admitting: General Surgery

## 2020-11-05 DIAGNOSIS — Z20822 Contact with and (suspected) exposure to covid-19: Secondary | ICD-10-CM | POA: Insufficient documentation

## 2020-11-05 DIAGNOSIS — Z01812 Encounter for preprocedural laboratory examination: Secondary | ICD-10-CM | POA: Insufficient documentation

## 2020-11-05 LAB — BASIC METABOLIC PANEL
Anion gap: 12 (ref 5–15)
BUN: 9 mg/dL (ref 6–20)
CO2: 24 mmol/L (ref 22–32)
Calcium: 9.4 mg/dL (ref 8.9–10.3)
Chloride: 104 mmol/L (ref 98–111)
Creatinine, Ser: 0.72 mg/dL (ref 0.44–1.00)
GFR, Estimated: >60 mL/min (ref >60)
Glucose, Bld: 103 mg/dL — ABNORMAL HIGH (ref 70–99)
Potassium: 3.7 mmol/L (ref 3.5–5.1)
Sodium: 140 mmol/L (ref 135–145)

## 2020-11-05 LAB — SARS CORONAVIRUS 2 (TAT 6-24 HRS): SARS Coronavirus 2: NEGATIVE

## 2020-11-05 NOTE — Progress Notes (Signed)

## 2020-11-08 ENCOUNTER — Ambulatory Visit (HOSPITAL_BASED_OUTPATIENT_CLINIC_OR_DEPARTMENT_OTHER)
Admission: RE | Admit: 2020-11-08 | Discharge: 2020-11-08 | Disposition: A | Discharge status: Home / Self Care | Payer: Medicaid Other | Attending: General Surgery | Admitting: General Surgery

## 2020-11-08 ENCOUNTER — Ambulatory Visit (HOSPITAL_BASED_OUTPATIENT_CLINIC_OR_DEPARTMENT_OTHER): Payer: Medicaid Other | Admitting: Anesthesiology

## 2020-11-08 ENCOUNTER — Other Ambulatory Visit: Payer: Self-pay | Admitting: General Surgery

## 2020-11-08 ENCOUNTER — Other Ambulatory Visit: Payer: Self-pay

## 2020-11-08 ENCOUNTER — Encounter (HOSPITAL_BASED_OUTPATIENT_CLINIC_OR_DEPARTMENT_OTHER): Payer: Self-pay | Admitting: General Surgery

## 2020-11-08 ENCOUNTER — Encounter (HOSPITAL_BASED_OUTPATIENT_CLINIC_OR_DEPARTMENT_OTHER)
Admission: RE | Disposition: A | Discharge status: Home / Self Care | Payer: Self-pay | Source: Home / Self Care | Attending: General Surgery

## 2020-11-08 DIAGNOSIS — C50512 Malignant neoplasm of lower-outer quadrant of left female breast: Secondary | ICD-10-CM | POA: Insufficient documentation

## 2020-11-08 DIAGNOSIS — Z170 Estrogen receptor positive status [ER+]: Secondary | ICD-10-CM | POA: Insufficient documentation

## 2020-11-08 DIAGNOSIS — C773 Secondary and unspecified malignant neoplasm of axilla and upper limb lymph nodes: Secondary | ICD-10-CM | POA: Insufficient documentation

## 2020-11-08 HISTORY — PX: AXILLARY LYMPH NODE DISSECTION: SHX5229

## 2020-11-08 HISTORY — DX: Anemia, unspecified: D64.9

## 2020-11-08 HISTORY — PX: RE-EXCISION OF BREAST LUMPECTOMY: SHX6048

## 2020-11-08 SURGERY — EXCISION, LESION, BREAST
Anesthesia: General | Site: Breast | Laterality: Left

## 2020-11-08 MED ORDER — HEPARIN SODIUM (PORCINE) 5000 UNIT/ML IJ SOLN: 5000.0000 [IU] | Freq: Three times a day (TID) | INTRAMUSCULAR | Status: DC

## 2020-11-08 MED ORDER — BUPIVACAINE HCL (PF) 0.25 % IJ SOLN
INTRAMUSCULAR | Status: DC | PRN
  Administered 2020-11-08: 20 mL

## 2020-11-08 MED ORDER — GABAPENTIN 300 MG PO CAPS
ORAL_CAPSULE | ORAL | Status: AC
  Filled 2020-11-08: qty 1

## 2020-11-08 MED ORDER — PROPOFOL 10 MG/ML IV BOLUS
INTRAVENOUS | Status: DC | PRN
  Administered 2020-11-08: 200 mg via INTRAVENOUS
  Administered 2020-11-08: 100 mg via INTRAVENOUS

## 2020-11-08 MED ORDER — ONDANSETRON HCL 4 MG/2ML IJ SOLN
INTRAMUSCULAR | Status: DC | PRN
  Administered 2020-11-08: 4 mg via INTRAVENOUS

## 2020-11-08 MED ORDER — CEFAZOLIN SODIUM-DEXTROSE 2-4 GM/100ML-% IV SOLN
2.0000 g | INTRAVENOUS | Status: AC
  Administered 2020-11-08: 2 g via INTRAVENOUS

## 2020-11-08 MED ORDER — OXYCODONE HCL 5 MG/5ML PO SOLN: 5.0000 mg | Freq: Once | ORAL | Status: AC | PRN

## 2020-11-08 MED ORDER — ONDANSETRON HCL 4 MG/2ML IJ SOLN
INTRAMUSCULAR | Status: AC
  Filled 2020-11-08: qty 8

## 2020-11-08 MED ORDER — HYDROMORPHONE HCL 1 MG/ML IJ SOLN
0.2500 mg | INTRAMUSCULAR | Status: DC | PRN
  Administered 2020-11-08 (×3): 0.5 mg via INTRAVENOUS

## 2020-11-08 MED ORDER — DEXAMETHASONE SODIUM PHOSPHATE 10 MG/ML IJ SOLN
INTRAMUSCULAR | Status: AC
  Filled 2020-11-08: qty 3

## 2020-11-08 MED ORDER — LACTATED RINGERS IV SOLN: INTRAVENOUS | Status: DC

## 2020-11-08 MED ORDER — PANTOPRAZOLE SODIUM 40 MG IV SOLR: 40.0000 mg | Freq: Every day | INTRAVENOUS | Status: DC

## 2020-11-08 MED ORDER — HYDROMORPHONE HCL 1 MG/ML IJ SOLN
INTRAMUSCULAR | Status: AC
  Filled 2020-11-08: qty 0.5

## 2020-11-08 MED ORDER — CHLORTHALIDONE 25 MG PO TABS: 25.0000 mg | ORAL_TABLET | Freq: Every morning | ORAL | Status: DC

## 2020-11-08 MED ORDER — MIDAZOLAM HCL 2 MG/2ML IJ SOLN
2.0000 mg | Freq: Once | INTRAMUSCULAR | Status: AC
  Administered 2020-11-08: 2 mg via INTRAVENOUS

## 2020-11-08 MED ORDER — LIDOCAINE 2% (20 MG/ML) 5 ML SYRINGE
INTRAMUSCULAR | Status: DC | PRN
  Administered 2020-11-08: 100 mg via INTRAVENOUS

## 2020-11-08 MED ORDER — MORPHINE SULFATE (PF) 4 MG/ML IV SOLN: 1.0000 mg | INTRAVENOUS | Status: DC | PRN

## 2020-11-08 MED ORDER — HYDROCODONE-ACETAMINOPHEN 5-325 MG PO TABS: 1.0000 | ORAL_TABLET | ORAL | Status: DC | PRN

## 2020-11-08 MED ORDER — CHLORHEXIDINE GLUCONATE CLOTH 2 % EX PADS: 6.0000 | MEDICATED_PAD | Freq: Once | CUTANEOUS | Status: DC

## 2020-11-08 MED ORDER — GABAPENTIN 300 MG PO CAPS
300.0000 mg | ORAL_CAPSULE | ORAL | Status: AC
  Administered 2020-11-08: 300 mg via ORAL

## 2020-11-08 MED ORDER — FENTANYL CITRATE (PF) 100 MCG/2ML IJ SOLN
INTRAMUSCULAR | Status: AC
  Filled 2020-11-08: qty 2

## 2020-11-08 MED ORDER — AMISULPRIDE (ANTIEMETIC) 5 MG/2ML IV SOLN
INTRAVENOUS | Status: AC
  Filled 2020-11-08: qty 4

## 2020-11-08 MED ORDER — CELECOXIB 200 MG PO CAPS
200.0000 mg | ORAL_CAPSULE | ORAL | Status: AC
  Administered 2020-11-08: 200 mg via ORAL

## 2020-11-08 MED ORDER — MIDAZOLAM HCL 2 MG/2ML IJ SOLN
INTRAMUSCULAR | Status: AC
  Filled 2020-11-08: qty 2

## 2020-11-08 MED ORDER — 0.9 % SODIUM CHLORIDE (POUR BTL) OPTIME
TOPICAL | Status: DC | PRN
  Administered 2020-11-08: 120 mL

## 2020-11-08 MED ORDER — ROPIVACAINE HCL 5 MG/ML IJ SOLN
INTRAMUSCULAR | Status: DC | PRN
  Administered 2020-11-08: 30 mL

## 2020-11-08 MED ORDER — FENTANYL CITRATE (PF) 250 MCG/5ML IJ SOLN
INTRAMUSCULAR | Status: DC | PRN
  Administered 2020-11-08 (×6): 25 ug via INTRAVENOUS
  Administered 2020-11-08: 50 ug via INTRAVENOUS

## 2020-11-08 MED ORDER — CLONAZEPAM 0.5 MG PO TABS: 0.5000 mg | ORAL_TABLET | Freq: Two times a day (BID) | ORAL | Status: DC | PRN

## 2020-11-08 MED ORDER — FENTANYL CITRATE (PF) 100 MCG/2ML IJ SOLN
100.0000 ug | Freq: Once | INTRAMUSCULAR | Status: AC
  Administered 2020-11-08: 100 ug via INTRAVENOUS

## 2020-11-08 MED ORDER — PROPOFOL 10 MG/ML IV BOLUS
INTRAVENOUS | Status: AC
  Filled 2020-11-08: qty 20

## 2020-11-08 MED ORDER — EPHEDRINE 5 MG/ML INJ
INTRAVENOUS | Status: AC
  Filled 2020-11-08: qty 20

## 2020-11-08 MED ORDER — CELECOXIB 200 MG PO CAPS
ORAL_CAPSULE | ORAL | Status: AC
  Filled 2020-11-08: qty 1

## 2020-11-08 MED ORDER — OXYCODONE-ACETAMINOPHEN 5-325 MG PO TABS: 1.0000 | ORAL_TABLET | ORAL | 0 refills | Status: DC | PRN

## 2020-11-08 MED ORDER — AMISULPRIDE (ANTIEMETIC) 5 MG/2ML IV SOLN
10.0000 mg | Freq: Once | INTRAVENOUS | Status: AC
  Administered 2020-11-08: 10 mg via INTRAVENOUS

## 2020-11-08 MED ORDER — LIDOCAINE 2% (20 MG/ML) 5 ML SYRINGE
INTRAMUSCULAR | Status: AC
  Filled 2020-11-08: qty 15

## 2020-11-08 MED ORDER — GABAPENTIN 300 MG PO CAPS: 300.0000 mg | ORAL_CAPSULE | Freq: Every day | ORAL | Status: DC

## 2020-11-08 MED ORDER — SODIUM CHLORIDE 0.9 % IV SOLN: INTRAVENOUS | Status: DC

## 2020-11-08 MED ORDER — CEFAZOLIN SODIUM-DEXTROSE 2-4 GM/100ML-% IV SOLN
INTRAVENOUS | Status: AC
  Filled 2020-11-08: qty 100

## 2020-11-08 MED ORDER — HYDROCODONE-ACETAMINOPHEN 5-325 MG PO TABS: 1.0000 | ORAL_TABLET | Freq: Four times a day (QID) | ORAL | 0 refills | Status: DC | PRN

## 2020-11-08 MED ORDER — ACETAMINOPHEN 500 MG PO TABS
ORAL_TABLET | ORAL | Status: AC
  Filled 2020-11-08: qty 2

## 2020-11-08 MED ORDER — OXYCODONE HCL 5 MG PO TABS
ORAL_TABLET | ORAL | Status: AC
  Filled 2020-11-08: qty 1

## 2020-11-08 MED ORDER — ONDANSETRON 4 MG PO TBDP: 4.0000 mg | ORAL_TABLET | Freq: Four times a day (QID) | ORAL | Status: DC | PRN

## 2020-11-08 MED ORDER — ACETAMINOPHEN 500 MG PO TABS
1000.0000 mg | ORAL_TABLET | ORAL | Status: AC
  Administered 2020-11-08: 1000 mg via ORAL

## 2020-11-08 MED ORDER — KETOROLAC TROMETHAMINE 30 MG/ML IJ SOLN
INTRAMUSCULAR | Status: AC
  Filled 2020-11-08: qty 1

## 2020-11-08 MED ORDER — METHOCARBAMOL 500 MG PO TABS: 500.0000 mg | ORAL_TABLET | Freq: Four times a day (QID) | ORAL | Status: DC | PRN

## 2020-11-08 MED ORDER — ONDANSETRON HCL 4 MG/2ML IJ SOLN: 4.0000 mg | Freq: Four times a day (QID) | INTRAMUSCULAR | Status: DC | PRN

## 2020-11-08 MED ORDER — PHENYLEPHRINE 40 MCG/ML (10ML) SYRINGE FOR IV PUSH (FOR BLOOD PRESSURE SUPPORT)
PREFILLED_SYRINGE | INTRAVENOUS | Status: AC
  Filled 2020-11-08: qty 10

## 2020-11-08 MED ORDER — DEXAMETHASONE SODIUM PHOSPHATE 10 MG/ML IJ SOLN
INTRAMUSCULAR | Status: DC | PRN
  Administered 2020-11-08: 5 mg via INTRAVENOUS

## 2020-11-08 MED ORDER — ONDANSETRON HCL 4 MG/2ML IJ SOLN: 4.0000 mg | Freq: Once | INTRAMUSCULAR | Status: DC | PRN

## 2020-11-08 MED ORDER — OXYCODONE HCL 5 MG PO TABS
5.0000 mg | ORAL_TABLET | Freq: Once | ORAL | Status: AC | PRN
  Administered 2020-11-08: 5 mg via ORAL

## 2020-11-08 SURGICAL SUPPLY — 58 items
APL PRP STRL LF DISP 70% ISPRP (MISCELLANEOUS) ×1
APPLIER CLIP 11 MED OPEN (CLIP) ×3
APPLIER CLIP 9.375 MED OPEN (MISCELLANEOUS) ×6
BIOPATCH RED 1 DISK 7.0 (GAUZE/BANDAGES/DRESSINGS) ×3 IMPLANT
BLADE SURG 10 STRL SS (BLADE) ×3 IMPLANT
BLADE SURG 15 STRL LF DISP TIS (BLADE) ×2 IMPLANT
BLADE SURG 15 STRL SS (BLADE) ×3
CANISTER SUCT 1200ML W/VALVE (MISCELLANEOUS) ×3 IMPLANT
CHLORAPREP W/TINT 26 (MISCELLANEOUS) ×3 IMPLANT
CLIP APPLIE 11 MED OPEN (CLIP) ×2 IMPLANT
CLIP APPLIE 9.375 MED OPEN (MISCELLANEOUS) ×4 IMPLANT
COVER BACK TABLE 60X90IN (DRAPES) ×3 IMPLANT
COVER MAYO STAND STRL (DRAPES) ×3 IMPLANT
COVER SURGICAL LIGHT HANDLE (MISCELLANEOUS) ×3 IMPLANT
COVER WAND RF STERILE (DRAPES) IMPLANT
DECANTER SPIKE VIAL GLASS SM (MISCELLANEOUS) IMPLANT
DERMABOND ADVANCED (GAUZE/BANDAGES/DRESSINGS) ×1
DERMABOND ADVANCED .7 DNX12 (GAUZE/BANDAGES/DRESSINGS) ×2 IMPLANT
DRAIN CHANNEL 19F RND (DRAIN) ×3 IMPLANT
DRAIN HEMOVAC 1/8 X 5 (WOUND CARE) IMPLANT
DRAPE LAPAROSCOPIC ABDOMINAL (DRAPES) ×3 IMPLANT
DRAPE UTILITY XL STRL (DRAPES) ×3 IMPLANT
DRSG TEGADERM 2-3/8X2-3/4 SM (GAUZE/BANDAGES/DRESSINGS) ×3 IMPLANT
ELECT COATED BLADE 2.86 ST (ELECTRODE) ×3 IMPLANT
ELECT REM PT RETURN 9FT ADLT (ELECTROSURGICAL) ×3
ELECTRODE REM PT RTRN 9FT ADLT (ELECTROSURGICAL) ×2 IMPLANT
EVACUATOR SILICONE 100CC (DRAIN) ×3 IMPLANT
GLOVE SURG ENC MOIS LTX SZ6.5 (GLOVE) ×3 IMPLANT
GLOVE SURG ENC MOIS LTX SZ7.5 (GLOVE) ×6 IMPLANT
GLOVE SURG ENC MOIS LTX SZ8 (GLOVE) ×3 IMPLANT
GLOVE SURG UNDER POLY LF SZ6.5 (GLOVE) ×3 IMPLANT
GOWN STRL REUS W/ TWL LRG LVL3 (GOWN DISPOSABLE) ×4 IMPLANT
GOWN STRL REUS W/TWL 2XL LVL3 (GOWN DISPOSABLE) ×3 IMPLANT
GOWN STRL REUS W/TWL LRG LVL3 (GOWN DISPOSABLE) ×6
ILLUMINATOR WAVEGUIDE N/F (MISCELLANEOUS) IMPLANT
KIT MARKER MARGIN INK (KITS) IMPLANT
LIGHT WAVEGUIDE WIDE FLAT (MISCELLANEOUS) IMPLANT
NEEDLE HYPO 25X1 1.5 SAFETY (NEEDLE) ×3 IMPLANT
NS IRRIG 1000ML POUR BTL (IV SOLUTION) ×3 IMPLANT
PACK BASIN DAY SURGERY FS (CUSTOM PROCEDURE TRAY) ×3 IMPLANT
PENCIL SMOKE EVACUATOR (MISCELLANEOUS) ×3 IMPLANT
SLEEVE SCD COMPRESS KNEE MED (STOCKING) ×3 IMPLANT
SPONGE LAP 18X18 RF (DISPOSABLE) ×3 IMPLANT
STAPLER VISISTAT 35W (STAPLE) ×3 IMPLANT
SUT ETHILON 3 0 PS 1 (SUTURE) ×3 IMPLANT
SUT MON AB 4-0 PC3 18 (SUTURE) ×6 IMPLANT
SUT SILK 2 0 SH (SUTURE) IMPLANT
SUT VIC AB 3-0 54X BRD REEL (SUTURE) IMPLANT
SUT VIC AB 3-0 BRD 54 (SUTURE)
SUT VIC AB 3-0 SH 27 (SUTURE)
SUT VIC AB 3-0 SH 27X BRD (SUTURE) IMPLANT
SUT VICRYL 3-0 CR8 SH (SUTURE) ×3 IMPLANT
SYR BULB EAR ULCER 3OZ GRN STR (SYRINGE) IMPLANT
SYR CONTROL 10ML LL (SYRINGE) ×3 IMPLANT
TOWEL GREEN STERILE FF (TOWEL DISPOSABLE) ×3 IMPLANT
TRAY FAXITRON CT DISP (TRAY / TRAY PROCEDURE) IMPLANT
TUBE CONNECTING 20X1/4 (TUBING) ×3 IMPLANT
YANKAUER SUCT BULB TIP NO VENT (SUCTIONS) ×3 IMPLANT

## 2020-11-08 NOTE — Discharge Instructions (Signed)

## 2020-11-08 NOTE — Transfer of Care (Signed)
Immediate Anesthesia Transfer of Care Note  Patient: Lydia Collins  Procedure(s) Performed: RE-EXCISION OF LEFT BREAST MARGIN (Left Breast) LEFT AXILLARY LYMPH NODE DISSECTION (Left Breast)  Patient Location: PACU  Anesthesia Type:GA combined with regional for post-op pain  Level of Consciousness: awake, alert  and oriented  Airway & Oxygen Therapy: Patient Spontanous Breathing and Patient connected to face mask oxygen  Post-op Assessment: Report given to RN and Post -op Vital signs reviewed and stable  Post vital signs: Reviewed and stable  Last Vitals:  Vitals Value Taken Time  BP 149/98 11/08/20 1537  Temp    Pulse 107 11/08/20 1538  Resp 14 11/08/20 1538  SpO2 100 % 11/08/20 1538  Vitals shown include unvalidated device data.  Last Pain:  Vitals:   11/08/20 1200  TempSrc: Oral  PainSc: 5       Patients Stated Pain Goal: 5 (03/54/65 6812)  Complications: No complications documented.

## 2020-11-08 NOTE — Interval H&P Note (Signed)
History and Physical Interval Note:  11/08/2020 1:32 PM  Lydia Collins  has presented today for surgery, with the diagnosis of LEFT BREAST CANCER.  The various methods of treatment have been discussed with the patient and family. After consideration of risks, benefits and other options for treatment, the patient has consented to  Procedure(s) with comments: RE-EXCISION OF LEFT BREAST MARGIN (Left) LEFT AXILLARY LYMPH NODE DISSECTION (Left) - 120 MINUTES TOTAL as a surgical intervention.  The patient's history has been reviewed, patient examined, no change in status, stable for surgery.  I have reviewed the patient's chart and labs.  Questions were answered to the patient's satisfaction.     Autumn Messing III

## 2020-11-08 NOTE — Anesthesia Procedure Notes (Signed)
Anesthesia Regional Block: Pectoralis block   Pre-Anesthetic Checklist: ,, timeout performed, Correct Patient, Correct Site, Correct Laterality, Correct Procedure, Correct Position, site marked, Risks and benefits discussed,  Surgical consent,  Pre-op evaluation,  At surgeon's request and post-op pain management  Laterality: Left  Prep: chloraprep       Needles:  Injection technique: Single-shot  Needle Type: Echogenic Needle     Needle Length: 9cm      Additional Needles:   Procedures:,,,, ultrasound used (permanent image in chart),,,,  Narrative:  Start time: 11/08/2020 1:27 PM End time: 11/08/2020 1:34 PM Injection made incrementally with aspirations every 5 mL.  Performed by: Personally  Anesthesiologist: Myrtie Soman, MD  Additional Notes: Patient tolerated the procedure well without complications

## 2020-11-08 NOTE — Anesthesia Preprocedure Evaluation (Signed)
Anesthesia Evaluation  Patient identified by MRN, date of birth, ID band Patient awake    Reviewed: Allergy & Precautions, H&P , NPO status , Patient's Chart, lab work & pertinent test results  Airway Mallampati: II  TM Distance: >3 FB Neck ROM: Full    Dental no notable dental hx.    Pulmonary Current Smoker,    Pulmonary exam normal breath sounds clear to auscultation       Cardiovascular hypertension, Normal cardiovascular exam Rhythm:Regular Rate:Normal     Neuro/Psych negative neurological ROS  negative psych ROS   GI/Hepatic negative GI ROS, Neg liver ROS,   Endo/Other  negative endocrine ROS  Renal/GU negative Renal ROS  negative genitourinary   Musculoskeletal negative musculoskeletal ROS (+)   Abdominal   Peds negative pediatric ROS (+)  Hematology negative hematology ROS (+)   Anesthesia Other Findings   Reproductive/Obstetrics negative OB ROS                             Anesthesia Physical Anesthesia Plan  ASA: II  Anesthesia Plan: General   Post-op Pain Management:    Induction: Intravenous  PONV Risk Score and Plan: 2 and Ondansetron, Dexamethasone and Treatment may vary due to age or medical condition  Airway Management Planned: LMA  Additional Equipment:   Intra-op Plan:   Post-operative Plan: Extubation in OR  Informed Consent: I have reviewed the patients History and Physical, chart, labs and discussed the procedure including the risks, benefits and alternatives for the proposed anesthesia with the patient or authorized representative who has indicated his/her understanding and acceptance.     Dental advisory given  Plan Discussed with: CRNA and Surgeon  Anesthesia Plan Comments:         Anesthesia Quick Evaluation

## 2020-11-08 NOTE — H&P (Signed)
Lydia Collins  Location: [facility] Surgery Patient #: [id number] DOB: 1965-12-19 Single / Language: Cleophus Molt / Race: White Female   History of Present Illness The patient is a 55 year old female who presents for a follow-up for Breast cancer. The patient is a 55 year old white female who is about 3 weeks status post left breast lumpectomy and sentinel node biopsy for a T1 cN1 a lobular left breast cancer that was ER/PR positive and HER-2 negative with a Ki-67 of 5%. She tolerated the surgery well. Her pathology did show a focally positive lateral margin of the left breast and 2 out of 2 positive nodes.    Review of Systems General Not Present- Appetite Loss, Chills, Fatigue, Fever, Night Sweats, Weight Gain and Weight Loss. Note: All other systems negative (unless as noted in HPI & included Review of Systems) Skin Not Present- Change in Wart/Mole, Dryness, Hives, Jaundice, New Lesions, Non-Healing Wounds, Rash and Ulcer. HEENT Not Present- Earache, Hearing Loss, Hoarseness, Nose Bleed, Oral Ulcers, Ringing in the Ears, Seasonal Allergies, Sinus Pain, Sore Throat, Visual Disturbances, Wears glasses/contact lenses and Yellow Eyes. Respiratory Not Present- Bloody sputum, Chronic Cough, Difficulty Breathing, Snoring and Wheezing. Breast Not Present- Breast Mass, Breast Pain, Nipple Discharge and Skin Changes. Cardiovascular Not Present- Chest Pain, Difficulty Breathing Lying Down, Leg Cramps, Palpitations, Rapid Heart Rate, Shortness of Breath and Swelling of Extremities. Gastrointestinal Not Present- Abdominal Pain, Bloating, Bloody Stool, Change in Bowel Habits, Chronic diarrhea, Constipation, Difficulty Swallowing, Excessive gas, Gets full quickly at meals, Hemorrhoids, Indigestion, Nausea, Rectal Pain and Vomiting. Female Genitourinary Not Present- Frequency, Nocturia, Painful Urination, Pelvic Pain and Urgency. Musculoskeletal Not Present- Back Pain, Joint Pain, Joint Stiffness,  Muscle Pain, Muscle Weakness and Swelling of Extremities. Neurological Not Present- Decreased Memory, Fainting, Headaches, Numbness, Seizures, Tingling, Tremor, Trouble walking and Weakness. Psychiatric Present- Anxiety. Not Present- Bipolar, Change in Sleep Pattern, Depression, Fearful and Frequent crying. Endocrine Not Present- Cold Intolerance, Excessive Hunger, Hair Changes, Heat Intolerance, Hot flashes and New Diabetes. Hematology Not Present- Easy Bruising, Excessive bleeding, Gland problems, HIV and Persistent Infections.   Physical Exam  General Mental Status-Alert. General Appearance-Consistent with stated age. Hydration-Well hydrated. Voice-Normal.  Head and Neck Head-normocephalic, atraumatic with no lesions or palpable masses. Trachea-midline. Thyroid Gland Characteristics - normal size and consistency.  Eye Eyeball - Bilateral-Extraocular movements intact. Sclera/Conjunctiva - Bilateral-No scleral icterus.  Chest and Lung Exam Chest and lung exam reveals -quiet, even and easy respiratory effort with no use of accessory muscles and on auscultation, normal breath sounds, no adventitious sounds and normal vocal resonance. Inspection Chest Wall - Normal. Back - normal.  Breast Note: The left breast inframammary fold and axillary incisions are healing nicely with no sign of infection or seroma.   Cardiovascular Cardiovascular examination reveals -normal heart sounds, regular rate and rhythm with no murmurs and normal pedal pulses bilaterally.  Abdomen Inspection Inspection of the abdomen reveals - No Hernias. Skin - Scar - no surgical scars. Palpation/Percussion Palpation and Percussion of the abdomen reveal - Soft, Non Tender, No Rebound tenderness, No Rigidity (guarding) and No hepatosplenomegaly. Auscultation Auscultation of the abdomen reveals - Bowel sounds normal.  Neurologic Neurologic evaluation reveals -alert and oriented x 3 with  no impairment of recent or remote memory. Mental Status-Normal.  Musculoskeletal Normal Exam - Left-Upper Extremity Strength Normal and Lower Extremity Strength Normal. Normal Exam - Right-Upper Extremity Strength Normal and Lower Extremity Strength Normal.  Lymphatic Head & Neck  General Head & Neck Lymphatics: Bilateral -  Description - Normal. Axillary  General Axillary Region: Bilateral - Description - Normal. Tenderness - Non Tender. Femoral & Inguinal  Generalized Femoral & Inguinal Lymphatics: Bilateral - Description - Normal. Tenderness - Non Tender.    Assessment & Plan  MALIGNANT NEOPLASM OF LOWER-OUTER QUADRANT OF LEFT FEMALE BREAST, UNSPECIFIED ESTROGEN RECEPTOR STATUS (C50.512) Impression: The patient is about 3 weeks status post left breast lumpectomy for breast cancer. She had a focally positive lateral margin and 2 out of 2 positive lymph nodes. Because of this my recommendation would be to have the lateral margin reexcised as well as a complete node dissection on the left. I have discussed this in detail with her including the risks and benefits of surgery as well as some of the technical aspects and she understands and wishes to proceed. She will then continue to follow-up with medical and radiation oncology for adjuvant therapy. Current Plans Follow up with [place] in the office in 6 MONTHS.   Call us sooner as needed.

## 2020-11-08 NOTE — Progress Notes (Signed)
Assisted Dr. Rose with left, ultrasound guided, pectoralis block. Side rails up, monitors on throughout procedure. See vital signs in flow sheet. Tolerated Procedure well. 

## 2020-11-08 NOTE — Op Note (Signed)
11/08/2020  3:25 PM  PATIENT:  Lydia Collins  55 y.o. female  PRE-OPERATIVE DIAGNOSIS:  LEFT BREAST CANCER  POST-OPERATIVE DIAGNOSIS:  LEFT BREAST CANCER  PROCEDURE:  Procedure(s): RE-EXCISION OF LEFT BREAST MARGIN (Left) DEEP LEFT AXILLARY LYMPH NODE DISSECTION (Left)  SURGEON:  Surgeon(s) and Role:    * Jovita Kussmaul, MD - Primary  PHYSICIAN ASSISTANT:   ASSISTANTS: none   ANESTHESIA:   local and general  EBL:  minimal   BLOOD ADMINISTERED:none  DRAINS: (1) Jackson-Pratt drain(s) with closed bulb suction in the left axilla   LOCAL MEDICATIONS USED:  MARCAINE     SPECIMEN:  Source of Specimen:  left breast lateral margin, left axillary contents  DISPOSITION OF SPECIMEN:  PATHOLOGY  COUNTS:  YES  TOURNIQUET:  * No tourniquets in log *  DICTATION: .Dragon Dictation   After informed consent was obtained the patient was brought to the operating room and placed in the supine position on the operating table.  After adequate induction of general anesthesia the patient's left chest, breast, and axillary area were prepped with ChloraPrep, allowed to dry, and draped in usual sterile manner.  An appropriate timeout was performed.  The inframammary fold incision was then infiltrated with quarter percent Marcaine.  The previous incision was opened sharply with a 15 blade knife.  The dissection was carried through the skin and subcutaneous tissue sharply with the electrocautery until the lumpectomy cavity was encountered.  The lateral margin of the cavity was then reexcised sharply with the electrocautery and the new true surgical margin was marked with a stitch.  This was sent to pathology for further evaluation.  Hemostasis was achieved using the Bovie electrocautery.  The deep layer of the wound was then closed with layers of interrupted 3-0 Vicryl stitches.  The skin was then closed with a running 4-0 Monocryl subcuticular stitch.  Attention was then turned to the left axilla.  The  patient had 2+ lymph nodes on her sentinel node procedure.  She returns for a completion node dissection.  The previous incision was opened sharply with a 15 blade knife and extended posteriorly.  The incision was carried through the skin and subcutaneous tissue sharply with the electrocautery until the serratus muscle of the chest wall was encountered.  The dissection was then carried laterally until the latissimus muscle was encountered.  The dissection was then carried superiorly by combination of blunt right angle dissection and some sharp dissection with the electrocautery until we were able to identify the left axillary vein.  The contents of the boundaries of the axilla that were justified were then teased out by blunt right angle dissection.  Several small intercostal brachial nerves and vessels were controlled with clips.  The long thoracic and thoracodorsal nerves were identified and spared during the dissection.  Once this was accomplished the contents of the left axilla were removed from the patient and sent to pathology for further evaluation.  Hemostasis was achieved using the Bovie electrocautery.  The wound was irrigated with saline.  A small stab incision was made near the mid axillary line inferior to the operative bed.  A tonsil clamp was placed through this opening and used to bring a 19 [place] round Blake drain into the axilla.  The drain was placed in the axilla in cut to the appropriate size.  The drain was anchored to the skin with a 3-0 nylon stitch.  Next the deep layer of the wound was closed with interrupted 3-0 Vicryl stitches.  The skin was then closed with a running 4-0 Monocryl subcuticular stitch.  The drain was placed to bulb suction and there was a good seal.  Dermabond dressings were applied.  The patient tolerated the procedure well.  At the end of the case all needle sponge and instrument counts were correct.  The patient was then awakened and taken recovery in stable  condition.  PLAN OF CARE: Admit for overnight observation  PATIENT DISPOSITION:  PACU - hemodynamically stable.   Delay start of Pharmacological VTE agent (>24hrs) due to surgical blood loss or risk of bleeding: no

## 2020-11-08 NOTE — Anesthesia Procedure Notes (Signed)
Anesthesia Procedure Image    

## 2020-11-08 NOTE — Anesthesia Postprocedure Evaluation (Signed)
Anesthesia Post Note  Patient: Lydia Collins  Procedure(s) Performed: RE-EXCISION OF LEFT BREAST MARGIN (Left Breast) LEFT AXILLARY LYMPH NODE DISSECTION (Left Axilla)     Patient location during evaluation: PACU Anesthesia Type: General Level of consciousness: awake and alert Pain management: pain level controlled Vital Signs Assessment: post-procedure vital signs reviewed and stable Respiratory status: spontaneous breathing, nonlabored ventilation, respiratory function stable and patient connected to nasal cannula oxygen Cardiovascular status: blood pressure returned to baseline and stable Postop Assessment: no apparent nausea or vomiting Anesthetic complications: no   No complications documented.  Last Vitals:  Vitals:   11/08/20 1619 11/08/20 1620  BP:    Pulse: 93 94  Resp: 18 19  Temp:    SpO2: 99% 99%    Last Pain:  Vitals:   11/08/20 1615  TempSrc:   PainSc: 8                  ROSE,GEORGE S

## 2020-11-11 ENCOUNTER — Encounter (HOSPITAL_BASED_OUTPATIENT_CLINIC_OR_DEPARTMENT_OTHER): Payer: Self-pay | Admitting: General Surgery

## 2020-11-12 LAB — SURGICAL PATHOLOGY

## 2020-11-14 ENCOUNTER — Encounter: Payer: Self-pay | Admitting: *Deleted

## 2020-11-14 ENCOUNTER — Other Ambulatory Visit: Payer: Self-pay | Admitting: *Deleted

## 2020-11-14 ENCOUNTER — Telehealth: Payer: Self-pay | Admitting: *Deleted

## 2020-11-14 DIAGNOSIS — C50512 Malignant neoplasm of lower-outer quadrant of left female breast: Secondary | ICD-10-CM

## 2020-11-14 DIAGNOSIS — Z170 Estrogen receptor positive status [ER+]: Secondary | ICD-10-CM

## 2020-11-14 NOTE — Telephone Encounter (Signed)
Attempted to call patient with number listed who is her boyfriend's number, unable to leave voicemail due to it is full. Found a note from Dr. Ethlyn Gallery office with a different number that she could be reached at [phone]. Left voicemail for a return appointment. Spoke with [facility] as well it seems she [facility] and it is hard to reach her.  The patient has been contacting Dr.Toth's office for frequent pain medication. Informed his office if they were to speak with her to let her know Dr. Geralyn Flash office is trying to reach her to discuss chemo.

## 2020-11-15 ENCOUNTER — Telehealth: Payer: Self-pay | Admitting: *Deleted

## 2020-11-15 ENCOUNTER — Telehealth: Payer: Self-pay | Admitting: Radiation Oncology

## 2020-11-15 NOTE — Telephone Encounter (Signed)
Called patient at [phone] to inform her that we canceled her 3/28 FUN and CT Sim and we will reschedule once Dr. Lindi Adie refers her back to [place]. The phone just kept ringing.

## 2020-11-15 NOTE — Telephone Encounter (Signed)
Spoke with patient to give her appointments for her CT/bone scan 3/22.  Explained instructions and that I would leave the contrast for her to pick up when she comes in to see Dr. Lindi Adie on 3/17.  Patient verbalized understanding.

## 2020-11-20 NOTE — Assessment & Plan Note (Signed)
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  ER/PR 95%, Ki-67 5%, HER2 negative 1+ by IHC positive lateral margin  Treatment plan: 1.  lateral margin: Benign, AXLND: 25/26 LN Positive 2. Adj chemo with DD AC foll by taxol. 3.  Adjuvant radiation therapy 4.  Follow-up adjuvant antiestrogen therapy ------------------------------------------------------------------------------------------------------------------------ Plan:Staging scans and if neg, plan for starting chemo

## 2020-11-21 ENCOUNTER — Other Ambulatory Visit: Payer: Self-pay

## 2020-11-21 ENCOUNTER — Other Ambulatory Visit: Payer: Self-pay | Admitting: Hematology and Oncology

## 2020-11-21 ENCOUNTER — Encounter: Payer: Self-pay | Admitting: Emergency Medicine

## 2020-11-21 ENCOUNTER — Encounter: Payer: Self-pay | Admitting: *Deleted

## 2020-11-21 ENCOUNTER — Inpatient Hospital Stay: Payer: Medicaid Other | Attending: Hematology and Oncology | Admitting: Hematology and Oncology

## 2020-11-21 DIAGNOSIS — C773 Secondary and unspecified malignant neoplasm of axilla and upper limb lymph nodes: Secondary | ICD-10-CM | POA: Insufficient documentation

## 2020-11-21 DIAGNOSIS — C50512 Malignant neoplasm of lower-outer quadrant of left female breast: Secondary | ICD-10-CM

## 2020-11-21 DIAGNOSIS — Z170 Estrogen receptor positive status [ER+]: Secondary | ICD-10-CM | POA: Insufficient documentation

## 2020-11-21 MED ORDER — HYDROCODONE-ACETAMINOPHEN 5-325 MG PO TABS: 1.0000 | ORAL_TABLET | Freq: Four times a day (QID) | ORAL | 0 refills | Status: DC | PRN

## 2020-11-21 NOTE — Progress Notes (Signed)
Patient Care Team: Patient, [name] as PCP - General (General Practice) [name], RN as Oncology Nurse Navigator [name], RN as Oncology Nurse Navigator  DIAGNOSIS:    ICD-10-CM   1. Malignant neoplasm of lower-outer quadrant of left breast of female, estrogen receptor positive ([place])  C50.512    Z17.0     SUMMARY OF ONCOLOGIC HISTORY: Oncology History  Malignant neoplasm of lower-outer quadrant of left breast of female, estrogen receptor positive ([place])  07/23/2020 Initial Diagnosis   Screening mammogram showed a left breast distortion. Diagnostic mammogram showed a mass at the 4 o'clock position in the left breast, 0.8cm, and 2 adjacent masses in the left breast at the 2 o'clock position, 0.7cm and 0.6cm, and no left axillary lymphadenopathy. Biopsy showed no evidence of malignancy at the 2 o'clock position, and IDC at the 4 o'clock position, grade 1, HER-2 negative (1+), ER/PR+ 95%, Ki67 5%.    09/13/2020 Surgery   Left lumpectomy [name]): invasive lobular carcinoma, 1.5cm, 2 left axillary lymph nodes positive for metastatic carcinoma.   11/08/2020 Surgery   Re-excision and left axillary lymph node dissection [name]): no residual carcinoma in the left breast, 25/26 lymph nodes positive for carcinoma     CHIEF COMPLIANT: Follow-up to discuss treatment plan  INTERVAL HISTORY: Lydia Collins is a 55 y.o. with above-mentioned history of left breast cancer who underwent a left lumpectomy followed by re-excision and left axillary lymph node dissection on 11/08/20 with Dr. [name] for which pathology showed no residual carcinoma in the left breast, and in the axilla, 25/26 lymph nodes positive for carcinoma. She presents to the clinic today to discuss further treatment.  She is complaining of a lot of pain in the axillary area where she now has a drainage tube.  She is expecting the drainage tube removed next week.  ALLERGIES:  is allergic to ibuprofen and  naproxen.  MEDICATIONS:  Current Outpatient Medications  Medication Sig Dispense Refill  . oxyCODONE-acetaminophen (PERCOCET) 5-325 MG tablet Take 1 tablet by mouth every 4 (four) hours as needed for severe pain. 20 tablet 0  . chlorthalidone (HYGROTON) 25 MG tablet Take 25 mg by mouth every morning.    . clonazePAM (KLONOPIN) 0.5 MG tablet Take 0.5 mg by mouth 2 (two) times daily as needed for anxiety.    . gabapentin (NEURONTIN) 100 MG capsule Take 3 capsules (300 mg total) by mouth at bedtime. 90 capsule 3  . HYDROcodone-acetaminophen (NORCO/VICODIN) 5-325 MG tablet Take 1-2 tablets by mouth every 6 (six) hours as needed for moderate pain or severe pain. 15 tablet 0   No current facility-administered medications for this visit.    PHYSICAL EXAMINATION: ECOG PERFORMANCE STATUS: 1 - Symptomatic but completely ambulatory  Vitals:   11/21/20 1442  BP: (!) 154/91  Pulse: 88  Resp: 18  Temp: 97.9 F (36.6 C)  SpO2: 100%   Filed Weights   11/21/20 1442  Weight: 172 lb 4.8 oz (78.2 kg)      LABORATORY DATA:  I have reviewed the data as listed CMP Latest Ref Rng & Units 11/05/2020 08/20/2020 05/03/2012  Glucose 70 - 99 mg/dL 103(H) 98 110(H)  BUN 6 - 20 mg/dL _0 Creatinine 0.44 - 1.00 mg/dL 0.72 1.00 0.83  Sodium 135 - 145 mmol/L 140 140 136  Potassium 3.5 - 5.1 mmol/L 3.7 3.8 4.1  Chloride 98 - 111 mmol/L 104 101 103  CO2 22 - 32 mmol/L 24 29 22  Calcium 8.9 - 10.3 mg/dL 9.4 9.2 9.9  Total Protein - - - -  Total Bilirubin - - - -  Alkaline Phos - - - -  AST - - - -  ALT - - - -    Lab Results  Component Value Date   WBC 15.9 (H) 05/03/2012   HGB 14.8 05/03/2012   HCT 43.0 05/03/2012   MCV 91.3 05/03/2012   PLT 283 05/03/2012   NEUTROABS 12.0 (H) 05/03/2012    ASSESSMENT & PLAN:  Malignant neoplasm of lower-outer quadrant of left breast of female, estrogen receptor positive ([place]) 09/13/2020:Left lumpectomy [name]): Grade 1 invasive lobular carcinoma, 1.5cm, 2/3  left axillary lymph nodes positive for metastatic carcinoma.  ER/PR 95%, Ki-67 5%, HER2 negative 1+ by IHC positive lateral margin  Treatment plan: 1.  lateral margin: Benign, AXLND: 25/26 LN Positive 2. Adj chemo with DD AC foll by taxol. 3.  Adjuvant radiation therapy 4.  Follow-up adjuvant antiestrogen therapy ------------------------------------------------------------------------------------------------------------------------ Plan:Staging scans and if neg, plan for starting chemo If the staging scans do not show any evidence of metastatic disease then she will receive adjuvant chemotherapy. We will call her the day after scans to discuss the results. After that I will request to Dr. [name] to put a port and then we can plan her chemotherapy.  I counseled her about the nausea study and she is interested in it. Return to clinic based upon the scan results.   No orders of the defined types were placed in this encounter.  The patient has a good understanding of the overall plan. she agrees with it. she will call with any problems that may develop before the next visit here.  Total time spent: 30 mins including face to face time and time spent for planning, charting and coordination of care  Rulon Eisenmenger, MD, MPH 11/21/2020  I, Molly Dorshimer, am acting as scribe for Dr. Nicholas Lose.  I have reviewed the above documentation for accuracy and completeness, and I agree with the above.

## 2020-11-21 NOTE — Research (Signed)
[id number] - TREATMENT OF REFRACTORY NAUSEA  11/21/20  Study Introduction: Patient Lydia Collins was identified by Dr. Lindi Adie as a potential candidate for the above listed study.  This Clinical Research Coordinator met with [name], MRN [id number], on 11/21/20 in a manner and location that ensures patient privacy to discuss participation in the above listed research study.  Patient is Unaccompanied.  A copy of the informed consent document and separate HIPAA Authorization was provided to the patient.  Patient reads, speaks, and understands [place].    Patient was provided with the business card of this Coordinator and encouraged to contact the research team with any questions.  Approximately 10 minutes were spent with the patient reviewing the informed consent documents.  Patient was provided the option of taking informed consent documents home to review and was encouraged to review at their convenience with their support network, including other care providers. Patient took the consent documents home to review.  Eligibility:  Patient was found to not be eligible for the study due to current use of clonazepam.  Patient reports taking clonazepam 0.87m every night.   KClabe SealClinical Research Coordinator I 11/21/20 3:28 PM

## 2020-11-22 ENCOUNTER — Telehealth: Payer: Self-pay | Admitting: Emergency Medicine

## 2020-11-22 ENCOUNTER — Telehealth: Payer: Self-pay | Admitting: Hematology and Oncology

## 2020-11-22 NOTE — Telephone Encounter (Signed)
[id number] - TREATMENT OF REFRACTORY NAUSEA  11/22/20  Called patient to inform her that she was ineligible for the [place] research study.  Patient did not answer and voicemail is full.  Clabe Seal Clinical Research Coordinator I  11/22/20  10:09 AM

## 2020-11-22 NOTE — Telephone Encounter (Signed)
Scheduled per 3/17 los. Called pt no answer and unable to leave msg

## 2020-11-25 NOTE — Telephone Encounter (Signed)
[id number] - TREATMENT OF REFRACTORY NAUSEA  11/25/20  Called patient again.  There was no answer and still unable to leave voicemail.  Clabe Seal Clinical Research Coordinator I  11/25/20  11:34 AM

## 2020-11-26 ENCOUNTER — Other Ambulatory Visit: Payer: Self-pay

## 2020-11-26 ENCOUNTER — Encounter (HOSPITAL_COMMUNITY)
Admission: RE | Admit: 2020-11-26 | Discharge: 2020-11-26 | Disposition: A | Discharge status: Home / Self Care | Payer: Medicaid Other | Source: Ambulatory Visit | Attending: Hematology and Oncology | Admitting: Hematology and Oncology

## 2020-11-26 ENCOUNTER — Ambulatory Visit (HOSPITAL_COMMUNITY)
Admission: RE | Admit: 2020-11-26 | Discharge: 2020-11-26 | Disposition: A | Discharge status: Home / Self Care | Payer: Medicaid Other | Source: Ambulatory Visit | Attending: Hematology and Oncology | Admitting: Hematology and Oncology

## 2020-11-26 ENCOUNTER — Encounter (HOSPITAL_COMMUNITY): Payer: Self-pay

## 2020-11-26 DIAGNOSIS — Z170 Estrogen receptor positive status [ER+]: Secondary | ICD-10-CM | POA: Insufficient documentation

## 2020-11-26 DIAGNOSIS — C50512 Malignant neoplasm of lower-outer quadrant of left female breast: Secondary | ICD-10-CM | POA: Diagnosis not present

## 2020-11-26 MED ORDER — IOHEXOL 300 MG/ML  SOLN
100.0000 mL | Freq: Once | INTRAMUSCULAR | Status: AC | PRN
  Administered 2020-11-26: 100 mL via INTRAVENOUS

## 2020-11-26 MED ORDER — TECHNETIUM TC 99M MEDRONATE IV KIT
22.0000 | PACK | Freq: Once | INTRAVENOUS | Status: AC | PRN
  Administered 2020-11-26: 22 via INTRAVENOUS

## 2020-11-26 NOTE — Progress Notes (Signed)
 Patient Care Team: Patient, [name] as PCP - General (General Practice) Stuart, Dawn C, RN as Oncology Nurse Navigator Martini, Keisha N, RN as Oncology Nurse Navigator  DIAGNOSIS:    ICD-10-CM   1. Malignant neoplasm of lower-outer quadrant of left breast of female, estrogen receptor positive (HCC)  C50.512    Z17.0     SUMMARY OF ONCOLOGIC HISTORY: Oncology History  Malignant neoplasm of lower-outer quadrant of left breast of female, estrogen receptor positive (HCC)  07/23/2020 Initial Diagnosis   Screening mammogram showed a left breast distortion. Diagnostic mammogram showed a mass at the 4 o'clock position in the left breast, 0.8cm, and 2 adjacent masses in the left breast at the 2 o'clock position, 0.7cm and 0.6cm, and no left axillary lymphadenopathy. Biopsy showed no evidence of malignancy at the 2 o'clock position, and IDC at the 4 o'clock position, grade 1, HER-2 negative (1+), ER/PR+ 95%, Ki67 5%.    09/13/2020 Surgery   Left lumpectomy (Toth): invasive lobular carcinoma, 1.5cm, 2 left axillary lymph nodes positive for metastatic carcinoma.   11/08/2020 Surgery   Re-excision and left axillary lymph node dissection (Toth): no residual carcinoma in the left breast, 25/26 lymph nodes positive for carcinoma     CHIEF COMPLIANT: Follow-up to review scans, discuss further treatment   INTERVAL HISTORY: Lydia Collins is a 55 y.o. with above-mentioned history of left breast cancer who underwent a left lumpectomy followed by re-excision and left axillary lymph node dissection. She presents to the clinic today to review her recent scans.   ALLERGIES:  is allergic to ibuprofen and naproxen.  MEDICATIONS:  Current Outpatient Medications  Medication Sig Dispense Refill  . chlorthalidone (HYGROTON) 25 MG tablet Take 25 mg by mouth every morning.    . clonazePAM (KLONOPIN) 0.5 MG tablet Take 0.5 mg by mouth 2 (two) times daily as needed for anxiety.    . gabapentin (NEURONTIN) 100  MG capsule Take 3 capsules (300 mg total) by mouth at bedtime. 90 capsule 3  . HYDROcodone-acetaminophen (NORCO/VICODIN) 5-325 MG tablet Take 1 tablet by mouth every 6 (six) hours as needed for moderate pain or severe pain. 15 tablet 0   No current facility-administered medications for this visit.    PHYSICAL EXAMINATION: ECOG PERFORMANCE STATUS: 1 - Symptomatic but completely ambulatory  There were no vitals filed for this visit.   LABORATORY DATA:  I have reviewed the data as listed CMP Latest Ref Rng & Units 11/05/2020 08/20/2020 05/03/2012  Glucose 70 - 99 mg/dL 103(H) 98 110(H)  BUN 6 - 20 mg/dL 9 10 19  Creatinine 0.44 - 1.00 mg/dL 0.72 1.00 0.83  Sodium 135 - 145 mmol/L 140 140 136  Potassium 3.5 - 5.1 mmol/L 3.7 3.8 4.1  Chloride 98 - 111 mmol/L 104 101 103  CO2 22 - 32 mmol/L 24 29 22  Calcium 8.9 - 10.3 mg/dL 9.4 9.2 9.9  Total Protein - - - -  Total Bilirubin - - - -  Alkaline Phos - - - -  AST - - - -  ALT - - - -    Lab Results  Component Value Date   WBC 15.9 (H) 05/03/2012   HGB 14.8 05/03/2012   HCT 43.0 05/03/2012   MCV 91.3 05/03/2012   PLT 283 05/03/2012   NEUTROABS 12.0 (H) 05/03/2012    ASSESSMENT & PLAN:  Malignant neoplasm of lower-outer quadrant of left breast of female, estrogen receptor positive (HCC) 09/13/2020:Left lumpectomy (Toth): Grade 1 invasive lobular   carcinoma, 1.5cm, 2/3 left axillary lymph nodes positive for metastatic carcinoma. ER/PR 95%, Ki-67 5%, HER2 negative 1+ by IHC positive lateral margin  Treatment plan: 1. lateral margin: Benign, AXLND: 25/26 LN Positive 2. Adj chemo with DD AC foll by taxol. 3. Adjuvant radiation therapy 4. Follow-up adjuvant antiestrogen therapy URCC nausea study CT CAP: 11/26/2020: Degenerative changes no metastatic disease Bone scan: 11/26/2020: Emphysema, no evidence of metastatic  disease ------------------------------------------------------------------------------------------------------------------------ Treatment plan: Patient is getting a port placed on 12/05/2020. We plan to start chemotherapy on 12/06/2020. She will need an echocardiogram and chemo class. She is willing to participate in the nausea study. Return to clinic week after for toxicity check.  No orders of the defined types were placed in this encounter.  The patient has a good understanding of the overall plan. she agrees with it. she will call with any problems that may develop before the next visit here.  Total time spent: 12 mins including face to face time and time spent for planning, charting and coordination of care  Rulon Eisenmenger, MD, MPH 11/27/2020  I, Cloyde Reams Dorshimer, am acting as scribe for Dr. Nicholas Lose.  I have reviewed the above documentation for accuracy and completeness, and I agree with the above.

## 2020-11-27 ENCOUNTER — Other Ambulatory Visit: Payer: Self-pay | Admitting: *Deleted

## 2020-11-27 ENCOUNTER — Inpatient Hospital Stay (HOSPITAL_BASED_OUTPATIENT_CLINIC_OR_DEPARTMENT_OTHER): Payer: Medicaid Other | Admitting: Hematology and Oncology

## 2020-11-27 ENCOUNTER — Telehealth: Payer: Self-pay | Admitting: *Deleted

## 2020-11-27 ENCOUNTER — Encounter: Payer: Self-pay | Admitting: *Deleted

## 2020-11-27 DIAGNOSIS — C50512 Malignant neoplasm of lower-outer quadrant of left female breast: Secondary | ICD-10-CM | POA: Diagnosis not present

## 2020-11-27 DIAGNOSIS — Z170 Estrogen receptor positive status [ER+]: Secondary | ICD-10-CM | POA: Diagnosis not present

## 2020-11-27 MED ORDER — ONDANSETRON HCL 8 MG PO TABS: 8.0000 mg | ORAL_TABLET | Freq: Two times a day (BID) | ORAL | 1 refills | Status: DC | PRN

## 2020-11-27 MED ORDER — PROCHLORPERAZINE MALEATE 10 MG PO TABS: 10.0000 mg | ORAL_TABLET | Freq: Four times a day (QID) | ORAL | 1 refills | Status: DC | PRN

## 2020-11-27 MED ORDER — LIDOCAINE-PRILOCAINE 2.5-2.5 % EX CREA: TOPICAL_CREAM | CUTANEOUS | 3 refills | Status: AC

## 2020-11-27 MED ORDER — DEXAMETHASONE 4 MG PO TABS: 4.0000 mg | ORAL_TABLET | Freq: Every day | ORAL | 0 refills | Status: DC

## 2020-11-27 NOTE — Assessment & Plan Note (Addendum)
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 ER/PR 95%, Ki-67 5%, HER2 negative 1+ by IHC positive lateral margin  Treatment plan: 1. lateral margin: Benign, AXLND: 25/26 LN Positive 2. Adj chemo with DD AC foll by taxol. 3. Adjuvant radiation therapy 4. Follow-up adjuvant antiestrogen therapy URCC nausea study CT CAP: 11/26/2020 Bone scan: 11/26/2020 ------------------------------------------------------------------------------------------------------------------------

## 2020-11-27 NOTE — Progress Notes (Signed)
START ON PATHWAY REGIMEN - Breast     Cycles 1 through 4: A cycle is every 14 days:     Doxorubicin      Cyclophosphamide      [id number]    Cycles 5 through 16: A cycle is every 7 days:     Paclitaxel   **Always confirm dose/schedule in your pharmacy ordering system**  Patient Characteristics: Postoperative without Neoadjuvant Therapy (Pathologic Staging), Invasive Disease, Adjuvant Therapy, HER2 Negative/Unknown/Equivocal, ER Positive, Node Positive, Node Positive (4+) Therapeutic Status: Postoperative without Neoadjuvant Therapy (Pathologic Staging) AJCC Grade: G1 AJCC N Category: pN3a AJCC M Category: cM0 ER Status: Positive (+) AJCC 8 Stage Grouping: IIIA HER2 Status: Negative (-) Oncotype Dx Recurrence Score: Not Appropriate AJCC T Category: pT1c PR Status: Positive (+) Adjuvant Therapy Status: No Adjuvant Therapy Received Yet or Changing Initial Adjuvant Regimen due to Tolerance Intent of Therapy: Curative Intent, Discussed with Patient

## 2020-11-27 NOTE — Telephone Encounter (Signed)
Spoke with patient to give her appointments for her echo and chemo class for 3/29. Patient verbalized understanding.

## 2020-11-28 ENCOUNTER — Encounter: Payer: Self-pay | Admitting: *Deleted

## 2020-11-29 ENCOUNTER — Telehealth: Payer: Self-pay | Admitting: *Deleted

## 2020-11-29 NOTE — Telephone Encounter (Signed)
Received call from pt with complaint of severe left breast pain and swelling x1 day.  Pt denies redness, drainage, or fever.  Per MD pt needing to contact Dr. Ethlyn Gallery office for further evaluation and tx.  Pt verbalized understanding.

## 2020-12-02 ENCOUNTER — Other Ambulatory Visit (HOSPITAL_COMMUNITY)
Admission: RE | Admit: 2020-12-02 | Discharge: 2020-12-02 | Disposition: A | Discharge status: Home / Self Care | Payer: Medicaid Other | Source: Ambulatory Visit | Attending: General Surgery | Admitting: General Surgery

## 2020-12-02 ENCOUNTER — Telehealth: Payer: Self-pay | Admitting: Hematology and Oncology

## 2020-12-02 ENCOUNTER — Ambulatory Visit: Payer: Medicaid Other | Admitting: Radiation Oncology

## 2020-12-02 ENCOUNTER — Encounter: Payer: Self-pay | Admitting: *Deleted

## 2020-12-02 ENCOUNTER — Ambulatory Visit: Payer: Medicaid Other

## 2020-12-02 DIAGNOSIS — Z01812 Encounter for preprocedural laboratory examination: Secondary | ICD-10-CM | POA: Insufficient documentation

## 2020-12-02 DIAGNOSIS — Z20822 Contact with and (suspected) exposure to covid-19: Secondary | ICD-10-CM | POA: Diagnosis not present

## 2020-12-02 LAB — SARS CORONAVIRUS 2 (TAT 6-24 HRS): SARS Coronavirus 2: NEGATIVE

## 2020-12-02 NOTE — Telephone Encounter (Signed)
Scheduled per 3/23 los. Called and left a msg for pt

## 2020-12-02 NOTE — Progress Notes (Signed)
Pharmacist Chemotherapy Monitoring - Initial Assessment    Anticipated start date: 12/06/20   Regimen:  . Are orders appropriate based on the patient's diagnosis, regimen, and cycle? Yes . Does the plan date match the patient's scheduled date? Yes . Is the sequencing of drugs appropriate? Yes . Are the premedications appropriate for the patient's regimen? Yes . Prior Authorization for treatment is: Approved o If applicable, is the correct biosimilar selected based on the patient's insurance? not applicable  Organ Function and Labs: Marland Kitchen Are dose adjustments needed based on the patient's renal function, hepatic function, or hematologic function? No . Are appropriate labs ordered prior to the start of patient's treatment? Yes . Other organ system assessment, if indicated: anthracyclines: Echo/ MUGA . The following baseline labs, if indicated, have been ordered: N/A  Dose Assessment: . Are the drug doses appropriate? Yes . Are the following correct: o Drug concentrations Yes o IV fluid compatible with drug Yes o Administration routes Yes o Timing of therapy Yes . If applicable, does the patient have documented access for treatment and/or plans for port-a-cath placement? yes . If applicable, have lifetime cumulative doses been properly documented and assessed? yes Lifetime Dose Tracking  No doses have been documented on this patient for the following tracked chemicals: Doxorubicin, Epirubicin, Idarubicin, Daunorubicin, Mitoxantrone, Bleomycin, Oxaliplatin, Carboplatin, Liposomal Doxorubicin  o   Toxicity Monitoring/Prevention: . The patient has the following take home antiemetics prescribed: Ondansetron, Prochlorperazine and Dexamethasone . The patient has the following take home medications prescribed: N/A . Medication allergies and previous infusion related reactions, if applicable, have been reviewed and addressed. Yes . The patient's current medication list has been assessed for  drug-drug interactions with their chemotherapy regimen. no significant drug-drug interactions were identified on review.  Order Review: . Are the treatment plan orders signed? Yes . Is the patient scheduled to see a provider prior to their treatment? Yes  I verify that I have reviewed each item in the above checklist and answered each question accordingly.   Kennith Center, Pharm.D., CPP 12/02/2020@4 :09 PM

## 2020-12-03 ENCOUNTER — Telehealth: Payer: Self-pay | Admitting: *Deleted

## 2020-12-03 ENCOUNTER — Inpatient Hospital Stay: Payer: Medicaid Other

## 2020-12-03 ENCOUNTER — Other Ambulatory Visit: Payer: Self-pay

## 2020-12-03 ENCOUNTER — Ambulatory Visit (HOSPITAL_COMMUNITY)
Admission: RE | Admit: 2020-12-03 | Discharge: 2020-12-03 | Disposition: A | Discharge status: Home / Self Care | Payer: Medicaid Other | Source: Ambulatory Visit | Attending: Hematology and Oncology | Admitting: Hematology and Oncology

## 2020-12-03 DIAGNOSIS — Z170 Estrogen receptor positive status [ER+]: Secondary | ICD-10-CM | POA: Diagnosis not present

## 2020-12-03 DIAGNOSIS — I10 Essential (primary) hypertension: Secondary | ICD-10-CM | POA: Diagnosis not present

## 2020-12-03 DIAGNOSIS — Z01818 Encounter for other preprocedural examination: Secondary | ICD-10-CM | POA: Insufficient documentation

## 2020-12-03 DIAGNOSIS — Z0189 Encounter for other specified special examinations: Secondary | ICD-10-CM

## 2020-12-03 DIAGNOSIS — C50512 Malignant neoplasm of lower-outer quadrant of left female breast: Secondary | ICD-10-CM | POA: Diagnosis not present

## 2020-12-03 DIAGNOSIS — F17210 Nicotine dependence, cigarettes, uncomplicated: Secondary | ICD-10-CM | POA: Diagnosis not present

## 2020-12-03 LAB — ECHOCARDIOGRAM COMPLETE
Area-P 1/2: 5.13 cm2
Calc EF: 63.7 %
S' Lateral: 2.15 cm
Single Plane A2C EF: 63.4 %
Single Plane A4C EF: 62.5 %

## 2020-12-03 NOTE — Telephone Encounter (Signed)
Second attempt to contact pt regarding missed chemo edu class today.  No answer, unable to LVM.

## 2020-12-03 NOTE — Telephone Encounter (Signed)
Received message from chemo EDU RN stating pt did not show for apt.  RN attempt x1 to contact pt, no answer.  Unable to LVM due to VM not being set up.

## 2020-12-03 NOTE — Progress Notes (Signed)
  Echocardiogram 2D Echocardiogram has been performed.  [name] 12/03/2020, 12:15 PM

## 2020-12-03 NOTE — Telephone Encounter (Signed)
Pt was arrived to education class but could not find her.  Receptionist said she wanted to know how long the class was & she had to go out to tell her ride.  Pt never returned.  Unable to reach pt by phone although left message to call back. Message to [facility] RN to r/s pt.

## 2020-12-04 ENCOUNTER — Telehealth: Payer: Self-pay | Admitting: *Deleted

## 2020-12-04 NOTE — Telephone Encounter (Signed)
RN 3rd attempt to contact pt regarding missed chemo EDU apt.  No answer, unable to LVM due to no VM being set up.

## 2020-12-05 ENCOUNTER — Ambulatory Visit (HOSPITAL_COMMUNITY): Payer: Medicaid Other

## 2020-12-05 ENCOUNTER — Ambulatory Visit (HOSPITAL_BASED_OUTPATIENT_CLINIC_OR_DEPARTMENT_OTHER): Payer: Medicaid Other | Admitting: Certified Registered"

## 2020-12-05 ENCOUNTER — Ambulatory Visit (HOSPITAL_BASED_OUTPATIENT_CLINIC_OR_DEPARTMENT_OTHER)
Admission: RE | Admit: 2020-12-05 | Discharge: 2020-12-05 | Disposition: A | Discharge status: Home / Self Care | Payer: Medicaid Other | Attending: General Surgery | Admitting: General Surgery

## 2020-12-05 ENCOUNTER — Encounter (HOSPITAL_BASED_OUTPATIENT_CLINIC_OR_DEPARTMENT_OTHER): Payer: Self-pay | Admitting: General Surgery

## 2020-12-05 ENCOUNTER — Ambulatory Visit: Payer: Self-pay | Admitting: General Surgery

## 2020-12-05 ENCOUNTER — Other Ambulatory Visit: Payer: Self-pay

## 2020-12-05 ENCOUNTER — Encounter (HOSPITAL_BASED_OUTPATIENT_CLINIC_OR_DEPARTMENT_OTHER)
Admission: RE | Disposition: A | Discharge status: Home / Self Care | Payer: Self-pay | Source: Home / Self Care | Attending: General Surgery

## 2020-12-05 DIAGNOSIS — C50912 Malignant neoplasm of unspecified site of left female breast: Secondary | ICD-10-CM | POA: Diagnosis present

## 2020-12-05 DIAGNOSIS — Z95828 Presence of other vascular implants and grafts: Secondary | ICD-10-CM

## 2020-12-05 DIAGNOSIS — C50512 Malignant neoplasm of lower-outer quadrant of left female breast: Secondary | ICD-10-CM | POA: Diagnosis not present

## 2020-12-05 DIAGNOSIS — Z170 Estrogen receptor positive status [ER+]: Secondary | ICD-10-CM | POA: Insufficient documentation

## 2020-12-05 DIAGNOSIS — Z419 Encounter for procedure for purposes other than remedying health state, unspecified: Secondary | ICD-10-CM

## 2020-12-05 HISTORY — PX: PORTACATH PLACEMENT: SHX2246

## 2020-12-05 LAB — BASIC METABOLIC PANEL
Anion gap: 11 (ref 5–15)
BUN: 11 mg/dL (ref 6–20)
CO2: 27 mmol/L (ref 22–32)
Calcium: 9.5 mg/dL (ref 8.9–10.3)
Chloride: 99 mmol/L (ref 98–111)
Creatinine, Ser: 0.84 mg/dL (ref 0.44–1.00)
GFR, Estimated: >60 mL/min (ref >60)
Glucose, Bld: 118 mg/dL — ABNORMAL HIGH (ref 70–99)
Potassium: 3.2 mmol/L — ABNORMAL LOW (ref 3.5–5.1)
Sodium: 137 mmol/L (ref 135–145)

## 2020-12-05 SURGERY — INSERTION, TUNNELED CENTRAL VENOUS DEVICE, WITH PORT
Anesthesia: General | Site: Breast

## 2020-12-05 MED ORDER — OXYCODONE HCL 5 MG/5ML PO SOLN: 5.0000 mg | Freq: Once | ORAL | Status: AC | PRN

## 2020-12-05 MED ORDER — CHLORHEXIDINE GLUCONATE CLOTH 2 % EX PADS: 6.0000 | MEDICATED_PAD | Freq: Once | CUTANEOUS | Status: DC

## 2020-12-05 MED ORDER — CEFAZOLIN SODIUM-DEXTROSE 2-4 GM/100ML-% IV SOLN
2.0000 g | INTRAVENOUS | Status: AC
  Administered 2020-12-05: 2 g via INTRAVENOUS

## 2020-12-05 MED ORDER — ACETAMINOPHEN 500 MG PO TABS
1000.0000 mg | ORAL_TABLET | ORAL | Status: AC
  Administered 2020-12-05: 1000 mg via ORAL

## 2020-12-05 MED ORDER — LACTATED RINGERS IV SOLN: INTRAVENOUS | Status: DC

## 2020-12-05 MED ORDER — OXYCODONE HCL 5 MG PO TABS
ORAL_TABLET | ORAL | Status: AC
  Filled 2020-12-05: qty 1

## 2020-12-05 MED ORDER — MIDAZOLAM HCL 2 MG/2ML IJ SOLN
INTRAMUSCULAR | Status: AC
  Filled 2020-12-05: qty 2

## 2020-12-05 MED ORDER — DEXMEDETOMIDINE (PRECEDEX) IN NS 20 MCG/5ML (4 MCG/ML) IV SYRINGE
PREFILLED_SYRINGE | INTRAVENOUS | Status: AC
  Filled 2020-12-05: qty 5

## 2020-12-05 MED ORDER — BUPIVACAINE-EPINEPHRINE 0.25% -1:200000 IJ SOLN
INTRAMUSCULAR | Status: DC | PRN
  Administered 2020-12-05: 10 mL

## 2020-12-05 MED ORDER — BUPIVACAINE-EPINEPHRINE (PF) 0.5% -1:200000 IJ SOLN
INTRAMUSCULAR | Status: AC
  Filled 2020-12-05: qty 60

## 2020-12-05 MED ORDER — ACETAMINOPHEN 500 MG PO TABS
ORAL_TABLET | ORAL | Status: AC
  Filled 2020-12-05: qty 2

## 2020-12-05 MED ORDER — ONDANSETRON HCL 4 MG/2ML IJ SOLN
INTRAMUSCULAR | Status: DC | PRN
  Administered 2020-12-05: 4 mg via INTRAVENOUS

## 2020-12-05 MED ORDER — FENTANYL CITRATE (PF) 100 MCG/2ML IJ SOLN
INTRAMUSCULAR | Status: AC
  Filled 2020-12-05: qty 2

## 2020-12-05 MED ORDER — ONDANSETRON HCL 4 MG/2ML IJ SOLN
INTRAMUSCULAR | Status: AC
  Filled 2020-12-05: qty 2

## 2020-12-05 MED ORDER — HEPARIN (PORCINE) IN NACL 1000-0.9 UT/500ML-% IV SOLN
INTRAVENOUS | Status: AC
  Filled 2020-12-05: qty 500

## 2020-12-05 MED ORDER — FENTANYL CITRATE (PF) 100 MCG/2ML IJ SOLN
25.0000 ug | INTRAMUSCULAR | Status: DC | PRN
  Administered 2020-12-05: 25 ug via INTRAVENOUS

## 2020-12-05 MED ORDER — FENTANYL CITRATE (PF) 100 MCG/2ML IJ SOLN
INTRAMUSCULAR | Status: DC | PRN
  Administered 2020-12-05 (×2): 50 ug via INTRAVENOUS

## 2020-12-05 MED ORDER — OXYCODONE HCL 5 MG PO TABS
5.0000 mg | ORAL_TABLET | Freq: Once | ORAL | Status: AC | PRN
  Administered 2020-12-05: 5 mg via ORAL

## 2020-12-05 MED ORDER — DEXAMETHASONE SODIUM PHOSPHATE 10 MG/ML IJ SOLN
INTRAMUSCULAR | Status: DC | PRN
  Administered 2020-12-05: 8 mg via INTRAVENOUS

## 2020-12-05 MED ORDER — HEPARIN (PORCINE) IN NACL 2-0.9 UNITS/ML
INTRAMUSCULAR | Status: AC | PRN
  Administered 2020-12-05: 500 mL via INTRAVENOUS

## 2020-12-05 MED ORDER — LIDOCAINE 2% (20 MG/ML) 5 ML SYRINGE
INTRAMUSCULAR | Status: DC | PRN
  Administered 2020-12-05: 80 mg via INTRAVENOUS

## 2020-12-05 MED ORDER — PROPOFOL 10 MG/ML IV BOLUS
INTRAVENOUS | Status: DC | PRN
  Administered 2020-12-05: 250 mg via INTRAVENOUS
  Administered 2020-12-05: 20 mg via INTRAVENOUS

## 2020-12-05 MED ORDER — GABAPENTIN 300 MG PO CAPS
ORAL_CAPSULE | ORAL | Status: AC
  Filled 2020-12-05: qty 1

## 2020-12-05 MED ORDER — GABAPENTIN 300 MG PO CAPS
300.0000 mg | ORAL_CAPSULE | ORAL | Status: AC
  Administered 2020-12-05: 300 mg via ORAL

## 2020-12-05 MED ORDER — HEPARIN SOD (PORK) LOCK FLUSH 100 UNIT/ML IV SOLN
INTRAVENOUS | Status: DC | PRN
  Administered 2020-12-05: 500 [IU] via INTRAVENOUS

## 2020-12-05 MED ORDER — ONDANSETRON HCL 4 MG/2ML IJ SOLN: 4.0000 mg | Freq: Four times a day (QID) | INTRAMUSCULAR | Status: DC | PRN

## 2020-12-05 MED ORDER — CEFAZOLIN SODIUM-DEXTROSE 2-4 GM/100ML-% IV SOLN
INTRAVENOUS | Status: AC
  Filled 2020-12-05: qty 100

## 2020-12-05 MED ORDER — LIDOCAINE 2% (20 MG/ML) 5 ML SYRINGE
INTRAMUSCULAR | Status: AC
  Filled 2020-12-05: qty 5

## 2020-12-05 MED ORDER — MIDAZOLAM HCL 5 MG/5ML IJ SOLN
INTRAMUSCULAR | Status: DC | PRN
  Administered 2020-12-05: 2 mg via INTRAVENOUS

## 2020-12-05 MED ORDER — DEXMEDETOMIDINE (PRECEDEX) IN NS 20 MCG/5ML (4 MCG/ML) IV SYRINGE
PREFILLED_SYRINGE | INTRAVENOUS | Status: DC | PRN
  Administered 2020-12-05 (×4): 4 ug via INTRAVENOUS

## 2020-12-05 MED ORDER — TRAMADOL HCL 50 MG PO TABS: 50.0000 mg | ORAL_TABLET | Freq: Four times a day (QID) | ORAL | 0 refills | Status: DC | PRN

## 2020-12-05 MED ORDER — DEXAMETHASONE SODIUM PHOSPHATE 10 MG/ML IJ SOLN
INTRAMUSCULAR | Status: AC
  Filled 2020-12-05: qty 1

## 2020-12-05 MED ORDER — HEPARIN SOD (PORK) LOCK FLUSH 100 UNIT/ML IV SOLN
INTRAVENOUS | Status: AC
  Filled 2020-12-05: qty 5

## 2020-12-05 SURGICAL SUPPLY — 43 items
ADH SKN CLS APL DERMABOND .7 (GAUZE/BANDAGES/DRESSINGS) ×1
APL PRP STRL LF DISP 70% ISPRP (MISCELLANEOUS) ×1
BAG DECANTER FOR FLEXI CONT (MISCELLANEOUS) ×2 IMPLANT
BLADE SURG 15 STRL LF DISP TIS (BLADE) ×1 IMPLANT
BLADE SURG 15 STRL SS (BLADE) ×2
CANISTER SUCT 1200ML W/VALVE (MISCELLANEOUS) IMPLANT
CHLORAPREP W/TINT 26 (MISCELLANEOUS) ×2 IMPLANT
CLEANER CAUTERY TIP 5X5 PAD (MISCELLANEOUS) ×1 IMPLANT
COVER BACK TABLE 60X90IN (DRAPES) ×2 IMPLANT
COVER MAYO STAND STRL (DRAPES) ×2 IMPLANT
COVER WAND RF STERILE (DRAPES) IMPLANT
DERMABOND ADVANCED (GAUZE/BANDAGES/DRESSINGS) ×1
DERMABOND ADVANCED .7 DNX12 (GAUZE/BANDAGES/DRESSINGS) ×1 IMPLANT
DRAPE C-ARM 42X72 X-RAY (DRAPES) ×2 IMPLANT
DRAPE LAPAROSCOPIC ABDOMINAL (DRAPES) ×2 IMPLANT
DRAPE UTILITY XL STRL (DRAPES) ×2 IMPLANT
ELECT REM PT RETURN 9FT ADLT (ELECTROSURGICAL) ×2
ELECTRODE REM PT RTRN 9FT ADLT (ELECTROSURGICAL) ×1 IMPLANT
GOWN STRL REUS W/ TWL LRG LVL3 (GOWN DISPOSABLE) ×2 IMPLANT
GOWN STRL REUS W/TWL LRG LVL3 (GOWN DISPOSABLE) ×4
IV KIT MINILOC 20X1 SAFETY (NEEDLE) IMPLANT
KIT PORT POWER 8FR ISP CVUE (Port) ×2 IMPLANT
NDL SAFETY ECLIPSE 18X1.5 (NEEDLE) IMPLANT
NEEDLE HYPO 18GX1.5 SHARP (NEEDLE)
NEEDLE HYPO 22GX1.5 SAFETY (NEEDLE) IMPLANT
NEEDLE HYPO 25X1 1.5 SAFETY (NEEDLE) ×2 IMPLANT
NEEDLE SPNL 22GX3.5 QUINCKE BK (NEEDLE) IMPLANT
PACK BASIN DAY SURGERY FS (CUSTOM PROCEDURE TRAY) ×2 IMPLANT
PAD CLEANER CAUTERY TIP 5X5 (MISCELLANEOUS) ×1
PENCIL SMOKE EVACUATOR (MISCELLANEOUS) ×2 IMPLANT
SLEEVE SCD COMPRESS KNEE MED (STOCKING) ×2 IMPLANT
SUT MON AB 4-0 PC3 18 (SUTURE) ×2 IMPLANT
SUT PROLENE 2 0 SH DA (SUTURE) ×2 IMPLANT
SUT SILK 2 0 TIES 17X18 (SUTURE)
SUT SILK 2-0 18XBRD TIE BLK (SUTURE) IMPLANT
SUT VIC AB 3-0 SH 27 (SUTURE) ×2
SUT VIC AB 3-0 SH 27X BRD (SUTURE) ×1 IMPLANT
SYR 10ML LL (SYRINGE) IMPLANT
SYR 5ML LL (SYRINGE) ×2 IMPLANT
SYR CONTROL 10ML LL (SYRINGE) ×4 IMPLANT
TOWEL GREEN STERILE FF (TOWEL DISPOSABLE) ×4 IMPLANT
TUBE CONNECTING 20X1/4 (TUBING) IMPLANT
YANKAUER SUCT BULB TIP NO VENT (SUCTIONS) IMPLANT

## 2020-12-05 NOTE — Anesthesia Procedure Notes (Signed)
Procedure Name: LMA Insertion Date/Time: 12/05/2020 11:19 AM Performed by: Gwyndolyn Saxon, CRNA Pre-anesthesia Checklist: Patient identified, Emergency Drugs available, Suction available and Patient being monitored Patient Re-evaluated:Patient Re-evaluated prior to induction Oxygen Delivery Method: Circle system utilized Preoxygenation: Pre-oxygenation with 100% oxygen Induction Type: IV induction LMA: LMA inserted LMA Size: 4.0 Number of attempts: 1 Placement Confirmation: positive ETCO2 and breath sounds checked- equal and bilateral Tube secured with: Tape Dental Injury: Teeth and Oropharynx as per pre-operative assessment  Comments: Teeth in same condition as before LMA insertion

## 2020-12-05 NOTE — H&P (Signed)
Lydia Collins  Location: [facility] Patient #: [id number] DOB: 08/16/1966 Single / Language: Cleophus Molt / Race: White Female   History of Present Illness  The patient is a 55 year old female who presents for a Follow-up for Breast cancer.The patient is a 55 year old white female who is about 2 weeks s/p reexcision of left breast lumpectomy and axillary node dissection for a T1cN2 left breast cancer. her drain is putting out minimal amounts of fluid    Review of Systems General Not Present- Appetite Loss, Chills, Fatigue, Fever, Night Sweats, Weight Gain and Weight Loss. Note: All other systems negative (unless as noted in HPI & included Review of Systems) Skin Not Present- Change in Wart/Mole, Dryness, Hives, Jaundice, New Lesions, Non-Healing Wounds, Rash and Ulcer. HEENT Not Present- Earache, Hearing Loss, Hoarseness, Nose Bleed, Oral Ulcers, Ringing in the Ears, Seasonal Allergies, Sinus Pain, Sore Throat, Visual Disturbances, Wears glasses/contact lenses and Yellow Eyes. Respiratory Not Present- Bloody sputum, Chronic Cough, Difficulty Breathing, Snoring and Wheezing. Breast Not Present- Breast Mass, Breast Pain, Nipple Discharge and Skin Changes. Cardiovascular Not Present- Chest Pain, Difficulty Breathing Lying Down, Leg Cramps, Palpitations, Rapid Heart Rate, Shortness of Breath and Swelling of Extremities. Gastrointestinal Not Present- Abdominal Pain, Bloating, Bloody Stool, Change in Bowel Habits, Chronic diarrhea, Constipation, Difficulty Swallowing, Excessive gas, Gets full quickly at meals, Hemorrhoids, Indigestion, Nausea, Rectal Pain and Vomiting. Female Genitourinary Not Present- Frequency, Nocturia, Painful Urination, Pelvic Pain and Urgency. Musculoskeletal Not Present- Back Pain, Joint Pain, Joint Stiffness, Muscle Pain, Muscle Weakness and Swelling of Extremities. Neurological Not Present- Decreased Memory, Fainting, Headaches, Numbness, Seizures, Tingling,  Tremor, Trouble walking and Weakness. Psychiatric Present- Anxiety. Not Present- Bipolar, Change in Sleep Pattern, Depression, Fearful and Frequent crying. Endocrine Not Present- Cold Intolerance, Excessive Hunger, Hair Changes, Heat Intolerance, Hot flashes and New Diabetes. Hematology Not Present- Easy Bruising, Excessive bleeding, Gland problems, HIV and Persistent Infections.   Physical Exam General Mental Status-Alert. General Appearance-Consistent with stated age. Hydration-Well hydrated. Voice-Normal.  Head and Neck Head-normocephalic, atraumatic with no lesions or palpable masses. Trachea-midline. Thyroid Gland Characteristics - normal size and consistency.  Eye Eyeball - Bilateral-Extraocular movements intact. Sclera/Conjunctiva - Bilateral-No scleral icterus.  Chest and Lung Exam Chest and lung exam reveals -quiet, even and easy respiratory effort with no use of accessory muscles and on auscultation, normal breath sounds, no adventitious sounds and normal vocal resonance. Inspection Chest Wall - Normal. Back - normal.  Breast Note: the left breast and axillary incisions are healing well with no sign of infection or seroma. the axillary drain was removed without difficulty   Cardiovascular Cardiovascular examination reveals -normal heart sounds, regular rate and rhythm with no murmurs and normal pedal pulses bilaterally.  Abdomen Inspection Inspection of the abdomen reveals - No Hernias. Skin - Scar - no surgical scars. Palpation/Percussion Palpation and Percussion of the abdomen reveal - Soft, Non Tender, No Rebound tenderness, No Rigidity (guarding) and No hepatosplenomegaly. Auscultation Auscultation of the abdomen reveals - Bowel sounds normal.  Neurologic Neurologic evaluation reveals -alert and oriented x 3 with no impairment of recent or remote memory. Mental Status-Normal.  Musculoskeletal Normal Exam - Left-Upper Extremity  Strength Normal and Lower Extremity Strength Normal. Normal Exam - Right-Upper Extremity Strength Normal and Lower Extremity Strength Normal.  Lymphatic Head & Neck  General Head & Neck Lymphatics: Bilateral - Description - Normal. Axillary  General Axillary Region: Bilateral - Description - Normal. Tenderness - Non Tender. Femoral & Inguinal  Generalized Femoral &  Inguinal Lymphatics: Bilateral - Description - Normal. Tenderness - Non Tender.    Assessment & Plan MALIGNANT NEOPLASM OF LOWER-OUTER QUADRANT OF LEFT FEMALE BREAST, UNSPECIFIED ESTROGEN RECEPTOR STATUS (C50.512) Impression: The patient is about 2 weeks status post left breast reexcision lumpectomy and axillary lymph node dissection. Her drain in the axilla was removed today without difficulty. She tolerated this well. Because of the number of positive nodes she will likely need chemotherapy and a port. I have discussed with her in detail the risks and benefits of the operation as well as some the technical aspects and she understands and wishes to proceed. We will try to get this placed for if possible before the start of chemotherapy and then she will continue to follow-up with oncology for adjuvant therapy. I will plan to see her back in several months. Current Plans Follow up with [place] in the office in 6 MONTHS.   Call us sooner as needed.

## 2020-12-05 NOTE — Transfer of Care (Signed)
Immediate Anesthesia Transfer of Care Note  Patient: NEELA ZECCA  Procedure(s) Performed: INSERTION PORT-A-CATH (N/A Breast)  Patient Location: PACU  Anesthesia Type:General  Level of Consciousness: drowsy  Airway & Oxygen Therapy: Patient Spontanous Breathing and Patient connected to face mask oxygen  Post-op Assessment: Report given to RN and Post -op Vital signs reviewed and stable  Post vital signs: Reviewed and stable  Last Vitals:  Vitals Value Taken Time  BP 99/61 12/05/20 1208  Temp 37 C 12/05/20 1208  Pulse 91 12/05/20 1212  Resp 14 12/05/20 1212  SpO2 99 % 12/05/20 1212  Vitals shown include unvalidated device data.  Last Pain:  Vitals:   12/05/20 1208  TempSrc:   PainSc: Asleep         Complications: No complications documented.

## 2020-12-05 NOTE — Progress Notes (Signed)
Patient Care Team: Patient, [name] as PCP - General (General Practice) [name], RN as Oncology Nurse Navigator [name], RN as Oncology Nurse Navigator  DIAGNOSIS:    ICD-10-CM   1. Malignant neoplasm of lower-outer quadrant of left breast of female, estrogen receptor positive ([place])  C50.512    Z17.0     SUMMARY OF ONCOLOGIC HISTORY: Oncology History  Malignant neoplasm of lower-outer quadrant of left breast of female, estrogen receptor positive ([place])  07/23/2020 Initial Diagnosis   Screening mammogram showed a left breast distortion. Diagnostic mammogram showed a mass at the 4 o'clock position in the left breast, 0.8cm, and 2 adjacent masses in the left breast at the 2 o'clock position, 0.7cm and 0.6cm, and no left axillary lymphadenopathy. Biopsy showed no evidence of malignancy at the 2 o'clock position, and IDC at the 4 o'clock position, grade 1, HER-2 negative (1+), ER/PR+ 95%, Ki67 5%.    09/13/2020 Surgery   Left lumpectomy [name]): invasive lobular carcinoma, 1.5cm, 2 left axillary lymph nodes positive for metastatic carcinoma.   11/08/2020 Surgery   Re-excision and left axillary lymph node dissection [name]): no residual carcinoma in the left breast, 25/26 lymph nodes positive for carcinoma   12/06/2020 -  Chemotherapy    Patient is on Treatment Plan: BREAST ADJUVANT DOSE DENSE AC Q14D / PACLITAXEL Q7D        CHIEF COMPLIANT: Cycle 1 Adriamycin and Cytoxan   INTERVAL HISTORY: Lydia Collins is a 55 y.o. with above-mentioned history of left breast cancerwhounderwent a left lumpectomyfollowed by re-excision and left axillary lymph node dissection and is currently on adjuvant chemotherapy with dose dense Adriamycin and Cytoxan. Echo on 12/03/20 showed an ejection fraction of 50-55%. Her port was placed on 12/05/20 by Dr. [name]. She presents to the clinic today for cycle 1.   ALLERGIES:  is allergic to ibuprofen and naproxen.  MEDICATIONS:   Current Outpatient Medications  Medication Sig Dispense Refill  . chlorthalidone (HYGROTON) 25 MG tablet Take 25 mg by mouth every morning.    . clonazePAM (KLONOPIN) 0.5 MG tablet Take 0.5 mg by mouth 2 (two) times daily as needed for anxiety.    Marland Kitchen dexamethasone (DECADRON) 4 MG tablet Take 1 tablet (4 mg total) by mouth daily. Take 1 tablet day after chemo and 1 tablet 2 days after chemo with food 8 tablet 0  . gabapentin (NEURONTIN) 100 MG capsule Take 3 capsules (300 mg total) by mouth at bedtime. 90 capsule 3  . HYDROcodone-acetaminophen (NORCO/VICODIN) 5-325 MG tablet Take 1 tablet by mouth every 6 (six) hours as needed for moderate pain or severe pain. 15 tablet 0  . lidocaine-prilocaine (EMLA) cream Apply to affected area once 30 g 3  . ondansetron (ZOFRAN) 8 MG tablet Take 1 tablet (8 mg total) by mouth 2 (two) times daily as needed. Start on the third day after chemotherapy. 30 tablet 1  . prochlorperazine (COMPAZINE) 10 MG tablet Take 1 tablet (10 mg total) by mouth every 6 (six) hours as needed (Nausea or vomiting). 30 tablet 1  . traMADol (ULTRAM) 50 MG tablet Take 1-2 tablets (50-100 mg total) by mouth every 6 (six) hours as needed. 10 tablet 0   No current facility-administered medications for this visit.    PHYSICAL EXAMINATION: ECOG PERFORMANCE STATUS: 1 - Symptomatic but completely ambulatory  There were no vitals filed for this visit. There were no vitals filed for this visit.   LABORATORY DATA:  I have reviewed  the data as listed CMP Latest Ref Rng & Units 12/05/2020 11/05/2020 08/20/2020  Glucose 70 - 99 mg/dL 118(H) 103(H) 98  BUN 6 - 20 mg/dL '11 9 10  ' Creatinine 0.44 - 1.00 mg/dL 0.84 0.72 1.00  Sodium 135 - 145 mmol/L 137 140 140  Potassium 3.5 - 5.1 mmol/L 3.2(L) 3.7 3.8  Chloride 98 - 111 mmol/L 99 104 101  CO2 22 - 32 mmol/L '27 24 29  ' Calcium 8.9 - 10.3 mg/dL 9.5 9.4 9.2  Total Protein - - - -  Total Bilirubin - - - -  Alkaline Phos - - - -  AST - - - -   ALT - - - -    Lab Results  Component Value Date   WBC 15.9 (H) 05/03/2012   HGB 14.8 05/03/2012   HCT 43.0 05/03/2012   MCV 91.3 05/03/2012   PLT 283 05/03/2012   NEUTROABS 12.0 (H) 05/03/2012    ASSESSMENT & PLAN:  Malignant neoplasm of lower-outer quadrant of left breast of female, estrogen receptor positive ([place]) 09/13/2020:Left lumpectomy [name]): Grade 1 invasive lobular carcinoma, 1.5cm, 2/3 left axillary lymph nodes positive for metastatic carcinoma. ER/PR 95%, Ki-67 5%, HER2 negative 1+ by IHC positive lateral margin  Treatment plan: 1. lateral margin: Benign, AXLND: 25/26 LN Positive 2. Adj chemo with DD AC foll by taxol. 3. Adjuvant radiation therapy 4. Follow-up adjuvant antiestrogen therapy URCC nausea study CT CAP: 11/26/2020: Degenerative changes no metastatic disease Bone scan: 11/26/2020: Emphysema, no evidence of metastatic disease URCC nausea study and [place] blood draw study ------------------------------------------------------------------------------------------------------------------------ Current treatment: Dose dense Adriamycin and Cytoxan cycle 1 day 1 Echocardiogram 12/03/2020: EF 50 to 55% low normal function We will plan another echocardiogram after 3 cycles of Adriamycin Labs reviewed, chemo consent obtained, chemo education completed, antiemetics reviewed Severe neck pain related to port placement yesterday: Ultram is not working so we will send her a few tablets of Vicodin.  Return to clinic in 1 week for toxicity evaluation     No orders of the defined types were placed in this encounter.  The patient has a good understanding of the overall plan. she agrees with it. she will call with any problems that may develop before the next visit here.  Total time spent: 30 mins including face to face time and time spent for planning, charting and coordination of care  Rulon Eisenmenger, MD, MPH 12/06/2020  I, Molly Dorshimer, am acting as scribe for  Dr. Nicholas Lose.  I have reviewed the above documentation for accuracy and completeness, and I agree with the above.

## 2020-12-05 NOTE — Anesthesia Postprocedure Evaluation (Signed)
Anesthesia Post Note  Patient: Lydia Collins  Procedure(s) Performed: INSERTION PORT-A-CATH (N/A Breast)     Patient location during evaluation: PACU Anesthesia Type: General Level of consciousness: awake and alert Pain management: pain level controlled Vital Signs Assessment: post-procedure vital signs reviewed and stable Respiratory status: spontaneous breathing, nonlabored ventilation, respiratory function stable and patient connected to nasal cannula oxygen Cardiovascular status: blood pressure returned to baseline and stable Postop Assessment: no apparent nausea or vomiting Anesthetic complications: no   No complications documented.  Last Vitals:  Vitals:   12/05/20 1245 12/05/20 1300  BP: 114/81 112/77  Pulse: 86 83  Resp: 17 15  Temp:    SpO2: 96% 98%    Last Pain:  Vitals:   12/05/20 1329  TempSrc:   PainSc: 4                  Hadlee Burback S

## 2020-12-05 NOTE — Discharge Instructions (Signed)
May take Tylenol after 2:20pm, if needed.    Post Anesthesia Home Care Instructions  Activity: Get plenty of rest for the remainder of the day. A responsible individual must stay with you for 24 hours following the procedure.  For the next 24 hours, DO NOT: -Drive a car -Paediatric nurse -Drink alcoholic beverages -Take any medication unless instructed by your physician -Make any legal decisions or sign important papers.  Meals: Start with liquid foods such as gelatin or soup. Progress to regular foods as tolerated. Avoid greasy, spicy, heavy foods. If nausea and/or vomiting occur, drink only clear liquids until the nausea and/or vomiting subsides. Call your physician if vomiting continues.  Special Instructions/Symptoms: Your throat may feel dry or sore from the anesthesia or the breathing tube placed in your throat during surgery. If this causes discomfort, gargle with warm salt water. The discomfort should disappear within 24 hours.  If you had a scopolamine patch placed behind your ear for the management of post- operative nausea and/or vomiting:  1. The medication in the patch is effective for 72 hours, after which it should be removed.  Wrap patch in a tissue and discard in the trash. Wash hands thoroughly with soap and water. 2. You may remove the patch earlier than 72 hours if you experience unpleasant side effects which may include dry mouth, dizziness or visual disturbances. 3. Avoid touching the patch. Wash your hands with soap and water after contact with the patch.

## 2020-12-05 NOTE — Op Note (Signed)
12/05/2020  12:02 PM  PATIENT:  Lydia Collins  55 y.o. female  PRE-OPERATIVE DIAGNOSIS:  LEFT BREAST CANCER  POST-OPERATIVE DIAGNOSIS:  LEFT BREAST CANCER  PROCEDURE:  Procedure(s): INSERTION PORT-A-CATH (N/A)  SURGEON:  Surgeon(s) and Role:    * Jovita Kussmaul, MD - Primary  PHYSICIAN ASSISTANT:   ASSISTANTS: none   ANESTHESIA:   local and general  EBL:  minimal   BLOOD ADMINISTERED:none  DRAINS: none   LOCAL MEDICATIONS USED:  MARCAINE     SPECIMEN:  No Specimen  DISPOSITION OF SPECIMEN:  N/A  COUNTS:  YES  TOURNIQUET:  * No tourniquets in log *  DICTATION: .Dragon Dictation   After informed consent was obtained the patient was brought to the operating room and placed in the supine position on the operating table.  After adequate induction of general anesthesia a roll was placed between the patient's shoulders to extend the shoulder slightly.  The right chest and neck area were then prepped with ChloraPrep, allowed to dry, and draped in usual sterile manner.  An appropriate timeout was performed.  The patient was placed in Trendelenburg position.  The ultrasound machine was used to identify the right internal jugular vein.  The large bore needle from the Port-A-Cath kit was used to access the right internal jugular vein under direct vision using the ultrasound machine.  I was then able to feed the wire through the needle using the Seldinger technique without difficulty.  The wire was confirmed in the central venous system using real-time fluoroscopy.  Next the right chest wall area was infiltrated with quarter percent Marcaine.  A small incision was made lateral to the bend of the clavicle with a 15 blade knife.  The incision was carried through the skin and subcutaneous tissue sharply with the electrocautery.  The subcutaneous pocket was created by blunt finger dissection inferior to the incision.  Next a small incision was made at the wire entry site in the right neck.   The 2 incisions were connected by blunt dissection.  A tendon passer was placed across the tunnel.  The tendon passer was then used to bring the tubing through the tunnel.  The tubing was then placed on the reservoir.  The reservoir was placed in the pocket and the length of the tubing was estimated using real-time fluoroscopy.  The tubing was cut to the appropriate length.  Next a sheath and dilator were fed over the wire using the Seldinger technique without difficulty.  The dilator and wire were removed from the patient.  The tubing was fed through the sheath as far as it would go and then held in place while the sheath was gently cracked and separated.  Another real-time fluoroscopy image showed the tip of the catheter to be in the distal superior vena cava.  The tubing was then permanently anchored to the reservoir.  The reservoir was anchored in the pocket with two 2-0 Prolene stitches.  The port was then aspirated and it aspirated blood easily.  The port was then flushed initially with a dilute heparin solution and then with a more concentrated heparin solution.  The incision at the neck was closed with an interrupted 4-0 Monocryl subcuticular stitch.  The subcutaneous tissue was closed over the port with interrupted 3-0 Vicryl stitches.  The skin was then closed with a running 4-0 Monocryl subcuticular stitch.  Dermabond dressings were applied.  The patient tolerated the procedure well.  At the end of the case all needle  sponge and instrument counts were correct.  The patient was then awakened and taken to recovery in stable condition.  PLAN OF CARE: Discharge to home after PACU  PATIENT DISPOSITION:  PACU - hemodynamically stable.   Delay start of Pharmacological VTE agent (>24hrs) due to surgical blood loss or risk of bleeding: not applicable

## 2020-12-05 NOTE — Anesthesia Preprocedure Evaluation (Signed)
Anesthesia Evaluation  Patient identified by MRN, date of birth, ID band Patient awake    Reviewed: Allergy & Precautions, H&P , NPO status , Patient's Chart, lab work & pertinent test results  Airway Mallampati: II   Neck ROM: full    Dental   Pulmonary Current Smoker,    breath sounds clear to auscultation       Cardiovascular hypertension,  Rhythm:regular Rate:Normal     Neuro/Psych PSYCHIATRIC DISORDERS Anxiety    GI/Hepatic   Endo/Other    Renal/GU      Musculoskeletal   Abdominal   Peds  Hematology   Anesthesia Other Findings   Reproductive/Obstetrics Breast CA                             Anesthesia Physical Anesthesia Plan  ASA: II  Anesthesia Plan: General   Post-op Pain Management:    Induction: Intravenous  PONV Risk Score and Plan: 2 and Ondansetron, Dexamethasone, Midazolam and Treatment may vary due to age or medical condition  Airway Management Planned: LMA  Additional Equipment:   Intra-op Plan:   Post-operative Plan: Extubation in OR  Informed Consent: I have reviewed the patients History and Physical, chart, labs and discussed the procedure including the risks, benefits and alternatives for the proposed anesthesia with the patient or authorized representative who has indicated his/her understanding and acceptance.     Dental advisory given  Plan Discussed with: CRNA, Anesthesiologist and Surgeon  Anesthesia Plan Comments:         Anesthesia Quick Evaluation

## 2020-12-05 NOTE — Interval H&P Note (Signed)
History and Physical Interval Note:  12/05/2020 9:40 AM  Lydia Collins  has presented today for surgery, with the diagnosis of LEFT BREAST CANCER.  The various methods of treatment have been discussed with the patient and family. After consideration of risks, benefits and other options for treatment, the patient has consented to  Procedure(s): INSERTION PORT-A-CATH (N/A) as a surgical intervention.  The patient's history has been reviewed, patient examined, no change in status, stable for surgery.  I have reviewed the patient's chart and labs.  Questions were answered to the patient's satisfaction.     Autumn Messing III

## 2020-12-06 ENCOUNTER — Encounter: Payer: Self-pay | Admitting: Emergency Medicine

## 2020-12-06 ENCOUNTER — Inpatient Hospital Stay: Payer: Medicaid Other

## 2020-12-06 ENCOUNTER — Telehealth: Payer: Self-pay | Admitting: Hematology and Oncology

## 2020-12-06 ENCOUNTER — Inpatient Hospital Stay (HOSPITAL_BASED_OUTPATIENT_CLINIC_OR_DEPARTMENT_OTHER): Payer: Medicaid Other | Admitting: Hematology and Oncology

## 2020-12-06 ENCOUNTER — Other Ambulatory Visit: Payer: Self-pay | Admitting: Emergency Medicine

## 2020-12-06 ENCOUNTER — Inpatient Hospital Stay: Payer: Medicaid Other | Attending: Hematology and Oncology

## 2020-12-06 ENCOUNTER — Encounter: Payer: Self-pay | Admitting: *Deleted

## 2020-12-06 DIAGNOSIS — Z5111 Encounter for antineoplastic chemotherapy: Secondary | ICD-10-CM | POA: Insufficient documentation

## 2020-12-06 DIAGNOSIS — C50512 Malignant neoplasm of lower-outer quadrant of left female breast: Secondary | ICD-10-CM

## 2020-12-06 DIAGNOSIS — Z170 Estrogen receptor positive status [ER+]: Secondary | ICD-10-CM

## 2020-12-06 DIAGNOSIS — C773 Secondary and unspecified malignant neoplasm of axilla and upper limb lymph nodes: Secondary | ICD-10-CM | POA: Insufficient documentation

## 2020-12-06 DIAGNOSIS — Z5189 Encounter for other specified aftercare: Secondary | ICD-10-CM | POA: Diagnosis not present

## 2020-12-06 LAB — CBC WITH DIFFERENTIAL (CANCER CENTER ONLY)
Abs Immature Granulocytes: 0.06 10*3/uL (ref 0.00–0.07)
Basophils Absolute: 0 10*3/uL (ref 0.0–0.1)
Basophils Relative: 0 %
Eosinophils Absolute: 0 10*3/uL (ref 0.0–0.5)
Eosinophils Relative: 0 %
HCT: 39.1 % (ref 36.0–46.0)
Hemoglobin: 13.4 g/dL (ref 12.0–15.0)
Immature Granulocytes: 0 %
Lymphocytes Relative: 17 %
Lymphs Abs: 2.7 10*3/uL (ref 0.7–4.0)
MCH: 29.6 pg (ref 26.0–34.0)
MCHC: 34.3 g/dL (ref 30.0–36.0)
MCV: 86.3 fL (ref 80.0–100.0)
Monocytes Absolute: 0.8 10*3/uL (ref 0.1–1.0)
Monocytes Relative: 5 %
Neutro Abs: 12.7 10*3/uL — ABNORMAL HIGH (ref 1.7–7.7)
Neutrophils Relative %: 78 %
Platelet Count: 304 10*3/uL (ref 150–400)
RBC: 4.53 MIL/uL (ref 3.87–5.11)
RDW: 12.7 % (ref 11.5–15.5)
WBC Count: 16.3 10*3/uL — ABNORMAL HIGH (ref 4.0–10.5)
nRBC: 0 % (ref 0.0–0.2)

## 2020-12-06 LAB — CMP (CANCER CENTER ONLY)
ALT: 14 U/L (ref 0–44)
AST: 13 U/L — ABNORMAL LOW (ref 15–41)
Albumin: 3.7 g/dL (ref 3.5–5.0)
Alkaline Phosphatase: 92 U/L (ref 38–126)
Anion gap: 12 (ref 5–15)
BUN: 12 mg/dL (ref 6–20)
CO2: 24 mmol/L (ref 22–32)
Calcium: 8.8 mg/dL — ABNORMAL LOW (ref 8.9–10.3)
Chloride: 106 mmol/L (ref 98–111)
Creatinine: 0.71 mg/dL (ref 0.44–1.00)
GFR, Estimated: >60 mL/min (ref >60)
Glucose, Bld: 112 mg/dL — ABNORMAL HIGH (ref 70–99)
Potassium: 3.4 mmol/L — ABNORMAL LOW (ref 3.5–5.1)
Sodium: 142 mmol/L (ref 135–145)
Total Bilirubin: 0.3 mg/dL (ref 0.3–1.2)
Total Protein: 7.2 g/dL (ref 6.5–8.1)

## 2020-12-06 MED ORDER — DOXORUBICIN HCL CHEMO IV INJECTION 2 MG/ML
60.0000 mg/m2 | Freq: Once | INTRAVENOUS | Status: AC
  Administered 2020-12-06: 112 mg via INTRAVENOUS
  Filled 2020-12-06: qty 56

## 2020-12-06 MED ORDER — SODIUM CHLORIDE 0.9 % IV SOLN
150.0000 mg | Freq: Once | INTRAVENOUS | Status: AC
  Administered 2020-12-06: 150 mg via INTRAVENOUS
  Filled 2020-12-06: qty 150

## 2020-12-06 MED ORDER — SODIUM CHLORIDE 0.9% FLUSH
10.0000 mL | INTRAVENOUS | Status: DC | PRN
  Filled 2020-12-06: qty 10

## 2020-12-06 MED ORDER — PALONOSETRON HCL INJECTION 0.25 MG/5ML
INTRAVENOUS | Status: AC
  Filled 2020-12-06: qty 5

## 2020-12-06 MED ORDER — SODIUM CHLORIDE 0.9 % IV SOLN
Freq: Once | INTRAVENOUS | Status: AC
  Filled 2020-12-06: qty 250

## 2020-12-06 MED ORDER — HEPARIN SOD (PORK) LOCK FLUSH 100 UNIT/ML IV SOLN
500.0000 [IU] | Freq: Once | INTRAVENOUS | Status: DC | PRN
  Filled 2020-12-06: qty 5

## 2020-12-06 MED ORDER — SODIUM CHLORIDE 0.9 % IV SOLN
600.0000 mg/m2 | Freq: Once | INTRAVENOUS | Status: AC
  Administered 2020-12-06: 1120 mg via INTRAVENOUS
  Filled 2020-12-06: qty 56

## 2020-12-06 MED ORDER — HYDROCODONE-ACETAMINOPHEN 5-325 MG PO TABS: 1.0000 | ORAL_TABLET | Freq: Four times a day (QID) | ORAL | 0 refills | Status: DC | PRN

## 2020-12-06 MED ORDER — PALONOSETRON HCL INJECTION 0.25 MG/5ML
0.2500 mg | Freq: Once | INTRAVENOUS | Status: AC
  Administered 2020-12-06: 0.25 mg via INTRAVENOUS

## 2020-12-06 MED ORDER — DEXAMETHASONE SODIUM PHOSPHATE 100 MG/10ML IJ SOLN
10.0000 mg | Freq: Once | INTRAMUSCULAR | Status: AC
  Administered 2020-12-06: 10 mg via INTRAVENOUS
  Filled 2020-12-06: qty 10

## 2020-12-06 NOTE — Research (Signed)
DCP - DCP-001 Use of a Clinical Trial Screening Tool to Address Cancer Health Disparities in the [place] [facility] ([facility])  12/06/20  Patient Lydia Collins was identified by Dr. Lindi Adie as a potential candidate for the above listed study.  This Clinical Research Coordinator met with Lydia Collins, [id number], on 12/06/20 in a manner and location that ensures patient privacy to discuss participation in the above listed research study.  Patient is Unaccompanied.  A copy of the informed consent document and separate HIPAA Authorization was provided to the patient.  Patient reads, speaks, and understands [place].    Patient was provided with the business card of this Coordinator and encouraged to contact the research team with any questions.  Patient was provided the option of taking informed consent documents home to review and was encouraged to review at their convenience with their support network, including other care providers. Patient is comfortable with making a decision regarding study participation today.  As outlined in the informed consent form, this Coordinator and Lydia Collins discussed the purpose of the research study, the investigational nature of the study, study procedures and requirements for study participation, potential risks and benefits of study participation, as well as alternatives to participation. The patient understands participation is voluntary and they may withdraw from study participation at any time. Patient understands enrollment is pending full eligibility review.   Confidentiality and how the patient's information will be used as part of study participation were discussed.  Patient was informed there is not reimbursement provided for their time and effort spent on trial participation.  The patient is encouraged to discuss research study participation with their insurance provider to determine what costs they may incur as part of study  participation, including research related injury.    All questions were answered to patient's satisfaction.  The informed consent and separate HIPAA Authorization was reviewed page by page.  The patient's mental and emotional status is appropriate to provide informed consent, and the patient verbalizes an understanding of study participation.  Patient has agreed to participate in the above listed research study and has voluntarily signed the informed consent version date 02/28/19 and separate HIPAA Authorization, version 5 on 12/06/20 at 1100 AM.  The patient was provided with a copy of the signed informed consent form and separate HIPAA Authorization for their reference.  No study specific procedures were obtained prior to the signing of the informed consent document.  Approximately 10 minutes were spent with the patient reviewing the informed consent documents.  Clabe Seal Clinical Research Coordinator I  12/06/20 5:02 PM

## 2020-12-06 NOTE — Patient Instructions (Signed)

## 2020-12-06 NOTE — Telephone Encounter (Signed)
Scheduled appts per 4/1 los. Updated calendar printed for pt per RN Lexine Baton.

## 2020-12-06 NOTE — Progress Notes (Signed)
Patient had port a cath placed on 12/05/20. Patient is complaining of pain in her neck where an incision was made during the port placement.  Patient denies taking any medications for pain, stating "I was told Dr. Lindi Adie will be giving me something for pain management". Upon assessment, minimal swelling was observed in the area. Patient denies pain at site of port a cath. Port a cath was access and flushed without any issues.  Patient is to see Dr. Lindi Adie today. Dr. Lindi Adie made aware.

## 2020-12-06 NOTE — Research (Signed)
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  Patient will continue with enrollment.  Wells Guiles 'Lav[place], RN, BSN Clinical Research Nurse 12/06/2020 10:41

## 2020-12-06 NOTE — Assessment & Plan Note (Signed)
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 ER/PR 95%, Ki-67 5%, HER2 negative 1+ by IHC positive lateral margin  Treatment plan: 1. lateral margin: Benign, AXLND: 25/26 LN Positive 2. Adj chemo with DD AC foll by taxol. 3. Adjuvant radiation therapy 4. Follow-up adjuvant antiestrogen therapy [facility] nausea study CT CAP: 11/26/2020: Degenerative changes no metastatic disease Bone scan: 11/26/2020: Emphysema, no evidence of metastatic disease [facility] nausea study ------------------------------------------------------------------------------------------------------------------------ Current treatment: Dose dense Adriamycin and Cytoxan cycle 1 day 1 Echocardiogram 12/03/2020: EF 50 to 55% low normal function We will plan another echocardiogram after 3 cycles of Adriamycin Labs reviewed, chemo consent obtained, chemo education completed, antiemetics reviewed  Return to clinic in 1 week for toxicity evaluation

## 2020-12-06 NOTE — Research (Signed)
[facility] - Customer service manager for the Discovery and Validation of Biomarkers for the Prediction, Diagnosis, and Management of Disease   Consent: Patient Lydia Collins was identified by Dr. Lindi Adie as a potential candidate for the above listed study.  This Clinical Research Coordinator met with STEPHANNIE BRONER, MRN [id number], on 12/06/20 in a manner and location that ensures patient privacy to discuss participation in the above listed research study.  Patient is Unaccompanied.  A copy of the informed consent document and separate HIPAA Authorization was provided to the patient.  Patient reads, speaks, and understands [place].    Patient was provided with the business card of this Coordinator and encouraged to contact the research team with any questions.  Patient was provided the option of taking informed consent documents home to review and was encouraged to review at their convenience with their support network, including other care providers. Patient is comfortable with making a decision regarding study participation today.  As outlined in the informed consent form, this Coordinator and CATTLEYA DOBRATZ discussed the purpose of the research study, the investigational nature of the study, study procedures and requirements for study participation, potential risks and benefits of study participation, as well as alternatives to participation. The patient understands participation is voluntary and they may withdraw from study participation at any time.   Patient understands enrollment is pending full eligibility review.   Confidentiality and how the patient's information will be used as part of study participation were discussed.  Patient was informed there is reimbursement provided for their time and effort spent on trial participation.  The patient is encouraged to discuss research study participation with their insurance provider to determine what costs they may incur as part of study  participation, including research related injury.    All questions were answered to patient's satisfaction.  The informed consent and separate HIPAA Authorization was reviewed page by page.  The patient's mental and emotional status is appropriate to provide informed consent, and the patient verbalizes an understanding of study participation.  Patient has agreed to participate in the above listed research study and has voluntarily signed the informed consent version 2 and separate HIPAA Authorization, version 5 on 12/06/20 at 1010 AM.  The patient was provided with a copy of the signed informed consent form and separate HIPAA Authorization for their reference.  No study specific procedures were obtained prior to the signing of the informed consent document.  Approximately 15 minutes were spent with the patient reviewing the informed consent documents.    Eligibility:  This Coordinator has reviewed this patient's inclusion and exclusion criteria and confirmed MIOSHA BEHE is eligible for study participation.  Patient will continue with enrollment.  Specimen Collection:  Blood specimen was collected by Dow Adolph, RN at [phone].  Gift Card:  Patient was given gift card for participation in the study after blood collection.  Patient was thanked for her time and participation.  Clabe Seal Clinical Research Coordinator I  12/06/20 11:07 AM

## 2020-12-06 NOTE — Patient Instructions (Addendum)
[facility] Discharge Instructions for Patients Receiving Chemotherapy  Today you received the following chemotherapy agents: Adriamycin/Cytoxan.  To help prevent nausea and vomiting after your treatment, we encourage you to take your nausea medication as directed.   If you develop nausea and vomiting that is not controlled by your nausea medication, call the clinic.   BELOW ARE SYMPTOMS THAT SHOULD BE REPORTED IMMEDIATELY:  *FEVER GREATER THAN 100.5 F  *CHILLS WITH OR WITHOUT FEVER  NAUSEA AND VOMITING THAT IS NOT CONTROLLED WITH YOUR NAUSEA MEDICATION  *UNUSUAL SHORTNESS OF BREATH  *UNUSUAL BRUISING OR BLEEDING  TENDERNESS IN MOUTH AND THROAT WITH OR WITHOUT PRESENCE OF ULCERS  *URINARY PROBLEMS  *BOWEL PROBLEMS  UNUSUAL RASH Items with * indicate a potential emergency and should be followed up as soon as possible.  Feel free to call the clinic should you have any questions or concerns. The clinic phone number is [phone].  Please show the [facility] at check-in to the Emergency Department and triage nurse.  Doxorubicin injection What is this medicine? DOXORUBICIN (dox oh ROO bi sin) is a chemotherapy drug. It is used to treat many kinds of cancer like leukemia, lymphoma, neuroblastoma, sarcoma, and Wilms' tumor. It is also used to treat bladder cancer, breast cancer, lung cancer, ovarian cancer, stomach cancer, and thyroid cancer. This medicine may be used for other purposes; ask your health care provider or pharmacist if you have questions. COMMON BRAND NAME(S): Adriamycin, Adriamycin PFS, Adriamycin RDF, Rubex What should I tell my health care provider before I take this medicine? They need to know if you have any of these conditions:  heart disease  history of low blood counts caused by a medicine  liver disease  recent or ongoing radiation therapy  an unusual or allergic reaction to doxorubicin, other chemotherapy agents, other  medicines, foods, dyes, or preservatives  pregnant or trying to get pregnant  breast-feeding How should I use this medicine? This drug is given as an infusion into a vein. It is administered in a hospital or clinic by a specially trained health care professional. If you have pain, swelling, burning or any unusual feeling around the site of your injection, tell your health care professional right away. Talk to your pediatrician regarding the use of this medicine in children. Special care may be needed. Overdosage: If you think you have taken too much of this medicine contact a poison control center or emergency room at once. NOTE: This medicine is only for you. Do not share this medicine with others. What if I miss a dose? It is important not to miss your dose. Call your doctor or health care professional if you are unable to keep an appointment. What may interact with this medicine? This medicine may interact with the following medications:  6-mercaptopurine  paclitaxel  phenytoin  St. John's Wort  trastuzumab  verapamil This list may not describe all possible interactions. Give your health care provider a list of all the medicines, herbs, non-prescription drugs, or dietary supplements you use. Also tell them if you smoke, drink alcohol, or use illegal drugs. Some items may interact with your medicine. What should I watch for while using this medicine? This drug may make you feel generally unwell. This is not uncommon, as chemotherapy can affect healthy cells as well as cancer cells. Report any side effects. Continue your course of treatment even though you feel ill unless your doctor tells you to stop. There is a maximum amount of this  medicine you should receive throughout your life. The amount depends on the medical condition being treated and your overall health. Your doctor will watch how much of this medicine you receive in your lifetime. Tell your doctor if you have taken this  medicine before. You may need blood work done while you are taking this medicine. Your urine may turn red for a few days after your dose. This is not blood. If your urine is dark or brown, call your doctor. In some cases, you may be given additional medicines to help with side effects. Follow all directions for their use. Call your doctor or health care professional for advice if you get a fever, chills or sore throat, or other symptoms of a cold or flu. Do not treat yourself. This drug decreases your body's ability to fight infections. Try to avoid being around people who are sick. This medicine may increase your risk to bruise or bleed. Call your doctor or health care professional if you notice any unusual bleeding. Talk to your doctor about your risk of cancer. You may be more at risk for certain types of cancers if you take this medicine. Do not become pregnant while taking this medicine or for 6 months after stopping it. Women should inform their doctor if they wish to become pregnant or think they might be pregnant. Men should not father a child while taking this medicine and for 6 months after stopping it. There is a potential for serious side effects to an unborn child. Talk to your health care professional or pharmacist for more information. Do not breast-feed an infant while taking this medicine. This medicine has caused ovarian failure in some women and reduced sperm counts in some men This medicine may interfere with the ability to have a child. Talk with your doctor or health care professional if you are concerned about your fertility. This medicine may cause a decrease in Co-Enzyme Q-10. You should make sure that you get enough Co-Enzyme Q-10 while you are taking this medicine. Discuss the foods you eat and the vitamins you take with your health care professional. What side effects may I notice from receiving this medicine? Side effects that you should report to your doctor or health care  professional as soon as possible:  allergic reactions like skin rash, itching or hives, swelling of the face, lips, or tongue  breathing problems  chest pain  fast or irregular heartbeat  low blood counts - this medicine may decrease the number of white blood cells, red blood cells and platelets. You may be at increased risk for infections and bleeding.  pain, redness, or irritation at site where injected  signs of infection - fever or chills, cough, sore throat, pain or difficulty passing urine  signs of decreased platelets or bleeding - bruising, pinpoint red spots on the skin, black, tarry stools, blood in the urine  swelling of the ankles, feet, hands  tiredness  weakness Side effects that usually do not require medical attention (report to your doctor or health care professional if they continue or are bothersome):  diarrhea  hair loss  mouth sores  nail discoloration or damage  nausea  red colored urine  vomiting This list may not describe all possible side effects. Call your doctor for medical advice about side effects. You may report side effects to FDA at 1-800-FDA-1088. Where should I keep my medicine? This drug is given in a hospital or clinic and will not be stored at home. NOTE: This  sheet is a summary. It may not cover all possible information. If you have questions about this medicine, talk to your doctor, pharmacist, or health care provider.  2021 Elsevier/Gold Standard (2017-04-07 11:01:26)  Cyclophosphamide Injection What is this medicine? CYCLOPHOSPHAMIDE (sye kloe FOSS fa mide) is a chemotherapy drug. It slows the growth of cancer cells. This medicine is used to treat many types of cancer like lymphoma, myeloma, leukemia, breast cancer, and ovarian cancer, to name a few. This medicine may be used for other purposes; ask your health care provider or pharmacist if you have questions. COMMON BRAND NAME(S): Cytoxan, Neosar What should I tell my health  care provider before I take this medicine? They need to know if you have any of these conditions:  heart disease  history of irregular heartbeat  infection  kidney disease  liver disease  low blood counts, like white cells, platelets, or red blood cells  on hemodialysis  recent or ongoing radiation therapy  scarring or thickening of the lungs  trouble passing urine  an unusual or allergic reaction to cyclophosphamide, other medicines, foods, dyes, or preservatives  pregnant or trying to get pregnant  breast-feeding How should I use this medicine? This drug is usually given as an injection into a vein or muscle or by infusion into a vein. It is administered in a hospital or clinic by a specially trained health care professional. Talk to your pediatrician regarding the use of this medicine in children. Special care may be needed. Overdosage: If you think you have taken too much of this medicine contact a poison control center or emergency room at once. NOTE: This medicine is only for you. Do not share this medicine with others. What if I miss a dose? It is important not to miss your dose. Call your doctor or health care professional if you are unable to keep an appointment. What may interact with this medicine?  amphotericin B  azathioprine  certain antivirals for HIV or hepatitis  certain medicines for blood pressure, heart disease, irregular heart beat  certain medicines that treat or prevent blood clots like warfarin  certain other medicines for cancer  cyclosporine  etanercept  indomethacin  medicines that relax muscles for surgery  medicines to increase blood counts  metronidazole This list may not describe all possible interactions. Give your health care provider a list of all the medicines, herbs, non-prescription drugs, or dietary supplements you use. Also tell them if you smoke, drink alcohol, or use illegal drugs. Some items may interact with your  medicine. What should I watch for while using this medicine? Your condition will be monitored carefully while you are receiving this medicine. You may need blood work done while you are taking this medicine. Drink water or other fluids as directed. Urinate often, even at night. Some products may contain alcohol. Ask your health care professional if this medicine contains alcohol. Be sure to tell all health care professionals you are taking this medicine. Certain medicines, like metronidazole and disulfiram, can cause an unpleasant reaction when taken with alcohol. The reaction includes flushing, headache, nausea, vomiting, sweating, and increased thirst. The reaction can last from 30 minutes to several hours. Do not become pregnant while taking this medicine or for 1 year after stopping it. Women should inform their health care professional if they wish to become pregnant or think they might be pregnant. Men should not father a child while taking this medicine and for 4 months after stopping it. There is potential for  serious side effects to an unborn child. Talk to your health care professional for more information. Do not breast-feed an infant while taking this medicine or for 1 week after stopping it. This medicine has caused ovarian failure in some women. This medicine may make it more difficult to get pregnant. Talk to your health care professional if you are concerned about your fertility. This medicine has caused decreased sperm counts in some men. This may make it more difficult to father a child. Talk to your health care professional if you are concerned about your fertility. Call your health care professional for advice if you get a fever, chills, or sore throat, or other symptoms of a cold or flu. Do not treat yourself. This medicine decreases your body's ability to fight infections. Try to avoid being around people who are sick. Avoid taking medicines that contain aspirin, acetaminophen,  ibuprofen, naproxen, or ketoprofen unless instructed by your health care professional. These medicines may hide a fever. Talk to your health care professional about your risk of cancer. You may be more at risk for certain types of cancer if you take this medicine. If you are going to need surgery or other procedure, tell your health care professional that you are using this medicine. Be careful brushing or flossing your teeth or using a toothpick because you may get an infection or bleed more easily. If you have any dental work done, tell your dentist you are receiving this medicine. What side effects may I notice from receiving this medicine? Side effects that you should report to your doctor or health care professional as soon as possible:  allergic reactions like skin rash, itching or hives, swelling of the face, lips, or tongue  breathing problems  nausea, vomiting  signs and symptoms of bleeding such as bloody or black, tarry stools; red or dark brown urine; spitting up blood or brown material that looks like coffee grounds; red spots on the skin; unusual bruising or bleeding from the eyes, gums, or nose  signs and symptoms of heart failure like fast, irregular heartbeat, sudden weight gain; swelling of the ankles, feet, hands  signs and symptoms of infection like fever; chills; cough; sore throat; pain or trouble passing urine  signs and symptoms of kidney injury like trouble passing urine or change in the amount of urine  signs and symptoms of liver injury like dark yellow or brown urine; general ill feeling or flu-like symptoms; light-colored stools; loss of appetite; nausea; right upper belly pain; unusually weak or tired; yellowing of the eyes or skin Side effects that usually do not require medical attention (report to your doctor or health care professional if they continue or are bothersome):  confusion  decreased hearing  diarrhea  facial flushing  hair  loss  headache  loss of appetite  missed menstrual periods  signs and symptoms of low red blood cells or anemia such as unusually weak or tired; feeling faint or lightheaded; falls  skin discoloration This list may not describe all possible side effects. Call your doctor for medical advice about side effects. You may report side effects to FDA at 1-800-FDA-1088. Where should I keep my medicine? This drug is given in a hospital or clinic and will not be stored at home. NOTE: This sheet is a summary. It may not cover all possible information. If you have questions about this medicine, talk to your doctor, pharmacist, or health care provider.  2021 Elsevier/Gold Standard (2019-05-29 09:53:29)

## 2020-12-09 ENCOUNTER — Inpatient Hospital Stay: Payer: Medicaid Other

## 2020-12-09 ENCOUNTER — Other Ambulatory Visit: Payer: Self-pay | Admitting: Hematology and Oncology

## 2020-12-09 ENCOUNTER — Telehealth: Payer: Self-pay | Admitting: *Deleted

## 2020-12-09 ENCOUNTER — Encounter: Payer: Self-pay | Admitting: *Deleted

## 2020-12-09 ENCOUNTER — Other Ambulatory Visit: Payer: Self-pay

## 2020-12-09 VITALS — BP 129/89 | HR 92 | Temp 98.2°F | Resp 18

## 2020-12-09 DIAGNOSIS — C50512 Malignant neoplasm of lower-outer quadrant of left female breast: Secondary | ICD-10-CM

## 2020-12-09 DIAGNOSIS — Z5111 Encounter for antineoplastic chemotherapy: Secondary | ICD-10-CM | POA: Diagnosis not present

## 2020-12-09 DIAGNOSIS — Z170 Estrogen receptor positive status [ER+]: Secondary | ICD-10-CM

## 2020-12-09 MED ORDER — HYDROCODONE-ACETAMINOPHEN 5-325 MG PO TABS: 1.0000 | ORAL_TABLET | Freq: Four times a day (QID) | ORAL | 0 refills | Status: DC | PRN

## 2020-12-09 MED ORDER — PEGFILGRASTIM-JMDB 6 MG/0.6ML ~~LOC~~ SOSY
PREFILLED_SYRINGE | SUBCUTANEOUS | Status: AC
  Filled 2020-12-09: qty 0.6

## 2020-12-09 MED ORDER — PEGFILGRASTIM-JMDB 6 MG/0.6ML ~~LOC~~ SOSY
6.0000 mg | PREFILLED_SYRINGE | Freq: Once | SUBCUTANEOUS | Status: AC
  Administered 2020-12-09: 6 mg via SUBCUTANEOUS

## 2020-12-09 NOTE — Telephone Encounter (Signed)
-----   Message from Wylene Men, RN sent at 12/06/2020  1:37 PM EDT ----- Regarding: [place] A/[facility] Patient received 1st time Adriamycin/Cytoxan.  Tolerated well.  No s/s or c/o distress or discomfort.

## 2020-12-09 NOTE — Patient Instructions (Signed)
Pegfilgrastim injection What is this medicine? PEGFILGRASTIM (PEG fil gra stim) is a long-acting granulocyte colony-stimulating factor that stimulates the growth of neutrophils, a type of white blood cell important in the body's fight against infection. It is used to reduce the incidence of fever and infection in patients with certain types of cancer who are receiving chemotherapy that affects the bone marrow, and to increase survival after being exposed to high doses of radiation. This medicine may be used for other purposes; ask your health care provider or pharmacist if you have questions. COMMON BRAND NAME(S): Fulphila, Neulasta, Nyvepria, UDENYCA, Ziextenzo What should I tell my health care provider before I take this medicine? They need to know if you have any of these conditions:  kidney disease  latex allergy  ongoing radiation therapy  sickle cell disease  skin reactions to acrylic adhesives (On-Body Injector only)  an unusual or allergic reaction to pegfilgrastim, filgrastim, other medicines, foods, dyes, or preservatives  pregnant or trying to get pregnant  breast-feeding How should I use this medicine? This medicine is for injection under the skin. If you get this medicine at home, you will be taught how to prepare and give the pre-filled syringe or how to use the On-body Injector. Refer to the patient Instructions for Use for detailed instructions. Use exactly as directed. Tell your healthcare provider immediately if you suspect that the On-body Injector may not have performed as intended or if you suspect the use of the On-body Injector resulted in a missed or partial dose. It is important that you put your used needles and syringes in a special sharps container. Do not put them in a trash can. If you do not have a sharps container, call your pharmacist or healthcare provider to get one. Talk to your pediatrician regarding the use of this medicine in children. While this drug  may be prescribed for selected conditions, precautions do apply. Overdosage: If you think you have taken too much of this medicine contact a poison control center or emergency room at once. NOTE: This medicine is only for you. Do not share this medicine with others. What if I miss a dose? It is important not to miss your dose. Call your doctor or health care professional if you miss your dose. If you miss a dose due to an On-body Injector failure or leakage, a new dose should be administered as soon as possible using a single prefilled syringe for manual use. What may interact with this medicine? Interactions have not been studied. This list may not describe all possible interactions. Give your health care provider a list of all the medicines, herbs, non-prescription drugs, or dietary supplements you use. Also tell them if you smoke, drink alcohol, or use illegal drugs. Some items may interact with your medicine. What should I watch for while using this medicine? Your condition will be monitored carefully while you are receiving this medicine. You may need blood work done while you are taking this medicine. Talk to your health care provider about your risk of cancer. You may be more at risk for certain types of cancer if you take this medicine. If you are going to need a MRI, CT scan, or other procedure, tell your doctor that you are using this medicine (On-Body Injector only). What side effects may I notice from receiving this medicine? Side effects that you should report to your doctor or health care professional as soon as possible:  allergic reactions (skin rash, itching or hives, swelling of   the face, lips, or tongue)  back pain  dizziness  fever  pain, redness, or irritation at site where injected  pinpoint red spots on the skin  red or dark-brown urine  shortness of breath or breathing problems  stomach or side pain, or pain at the shoulder  swelling  tiredness  trouble  passing urine or change in the amount of urine  unusual bruising or bleeding Side effects that usually do not require medical attention (report to your doctor or health care professional if they continue or are bothersome):  bone pain  muscle pain This list may not describe all possible side effects. Call your doctor for medical advice about side effects. You may report side effects to FDA at 1-800-FDA-1088. Where should I keep my medicine? Keep out of the reach of children. If you are using this medicine at home, you will be instructed on how to store it. Throw away any unused medicine after the expiration date on the label. NOTE: This sheet is a summary. It may not cover all possible information. If you have questions about this medicine, talk to your doctor, pharmacist, or health care provider.  2021 Elsevier/Gold Standard (2019-09-15 13:20:51)  

## 2020-12-09 NOTE — Telephone Encounter (Signed)
Attempted to call pt on mobile & home # listed to see how she did with her treatment last week.  Unable to leave message on home # & left message on mobile # to call back [facility] RN.

## 2020-12-11 ENCOUNTER — Encounter (HOSPITAL_BASED_OUTPATIENT_CLINIC_OR_DEPARTMENT_OTHER): Payer: Self-pay | Admitting: General Surgery

## 2020-12-12 ENCOUNTER — Encounter: Payer: Self-pay | Admitting: Hematology and Oncology

## 2020-12-12 NOTE — Progress Notes (Signed)
Patient Care Team: Patient, [name] as PCP - General (General Practice) Lydia Kaufmann, RN as Oncology Nurse Navigator Lydia Germany, RN as Oncology Nurse Navigator  DIAGNOSIS:    ICD-10-CM   1. Malignant neoplasm of lower-outer quadrant of left breast of female, estrogen receptor positive ([place])  C50.512    Z17.0     SUMMARY OF ONCOLOGIC HISTORY: Oncology History  Malignant neoplasm of lower-outer quadrant of left breast of female, estrogen receptor positive ([place])  07/23/2020 Initial Diagnosis   Screening mammogram showed a left breast distortion. Diagnostic mammogram showed a mass at the 4 o'clock position in the left breast, 0.8cm, and 2 adjacent masses in the left breast at the 2 o'clock position, 0.7cm and 0.6cm, and no left axillary lymphadenopathy. Biopsy showed no evidence of malignancy at the 2 o'clock position, and IDC at the 4 o'clock position, grade 1, HER-2 negative (1+), ER/PR+ 95%, Ki67 5%.    09/13/2020 Surgery   Left lumpectomy Lydia Collins): invasive lobular carcinoma, 1.5cm, 2 left axillary lymph nodes positive for metastatic carcinoma.   11/08/2020 Surgery   Re-excision and left axillary lymph node dissection Lydia Collins): no residual carcinoma in the left breast, 25/26 lymph nodes positive for carcinoma   12/06/2020 -  Chemotherapy    Patient is on Treatment Plan: BREAST ADJUVANT DOSE DENSE AC Q14D / PACLITAXEL Q7D        CHIEF COMPLIANT: Cycle 1 Day 8 Adriamycin and Cytoxan   INTERVAL HISTORY: Lydia Collins is a 55 y.o. with above-mentioned history of left breast cancerwhounderwent a left lumpectomyfollowed by re-excision and left axillary lymph node dissection and is currently on adjuvant chemotherapy with dose dense Adriamycin and Cytoxan. She presents to the clinic today for a toxicity check following cycle 1.   She tolerated chemo reasonably well.  She did have fatigue after chemo but also nausea.  She did not have emesis.  She was having recurrent  headaches and was taking narcotic pain medication but I instructed her to stop those at this time.  ALLERGIES:  is allergic to ibuprofen and naproxen.  MEDICATIONS:  Current Outpatient Medications  Medication Sig Dispense Refill  . chlorthalidone (HYGROTON) 25 MG tablet Take 25 mg by mouth every morning.    . clonazePAM (KLONOPIN) 0.5 MG tablet Take 0.5 mg by mouth 2 (two) times daily as needed for anxiety.    Marland Kitchen dexamethasone (DECADRON) 4 MG tablet Take 1 tablet (4 mg total) by mouth daily. Take 1 tablet day after chemo and 1 tablet 2 days after chemo with food 8 tablet 0  . gabapentin (NEURONTIN) 100 MG capsule Take 3 capsules (300 mg total) by mouth at bedtime. 90 capsule 3  . HYDROcodone-acetaminophen (NORCO/VICODIN) 5-325 MG tablet Take 1 tablet by mouth every 6 (six) hours as needed for moderate pain or severe pain. 10 tablet 0  . lidocaine-prilocaine (EMLA) cream Apply to affected area once 30 g 3  . ondansetron (ZOFRAN) 8 MG tablet Take 1 tablet (8 mg total) by mouth 2 (two) times daily as needed. Start on the third day after chemotherapy. 30 tablet 1  . prochlorperazine (COMPAZINE) 10 MG tablet Take 1 tablet (10 mg total) by mouth every 6 (six) hours as needed (Nausea or vomiting). 30 tablet 1  . traMADol (ULTRAM) 50 MG tablet Take 1-2 tablets (50-100 mg total) by mouth every 6 (six) hours as needed. 10 tablet 0   No current facility-administered medications for this visit.    PHYSICAL EXAMINATION: ECOG PERFORMANCE  STATUS: 1 - Symptomatic but completely ambulatory  Vitals:   12/13/20 1245  BP: 115/83  Pulse: (!) 119  Resp: 17  Temp: 97.7 F (36.5 C)  SpO2: 96%     LABORATORY DATA:  I have reviewed the data as listed CMP Latest Ref Rng & Units 12/06/2020 12/05/2020 11/05/2020  Glucose 70 - 99 mg/dL 112(H) 118(H) 103(H)  BUN 6 - 20 mg/dL _0 Creatinine 0.44 - 1.00 mg/dL 0.71 0.84 0.72  Sodium 135 - 145 mmol/L 142 137 140  Potassium 3.5 - 5.1 mmol/L 3.4(L) 3.2(L) 3.7   Chloride 98 - 111 mmol/L 106 99 104  CO2 22 - 32 mmol/L _1 Calcium 8.9 - 10.3 mg/dL 8.8(L) 9.5 9.4  Total Protein 6.5 - 8.1 g/dL 7.2 - -  Total Bilirubin 0.3 - 1.2 mg/dL 0.3 - -  Alkaline Phos 38 - 126 U/L 92 - -  AST 15 - 41 U/L 13(L) - -  ALT 0 - 44 U/L 14 - -    Lab Results  Component Value Date   WBC 2.6 (L) 12/13/2020   HGB 13.0 12/13/2020   HCT 38.7 12/13/2020   MCV 86.2 12/13/2020   PLT 84 (L) 12/13/2020   NEUTROABS PENDING 12/13/2020    ASSESSMENT & PLAN:  Malignant neoplasm of lower-outer quadrant of left breast of female, estrogen receptor positive ([place]) 09/13/2020:Left lumpectomy Lydia Collins): Grade 1 invasive lobular carcinoma, 1.5cm, 2/3 left axillary lymph nodes positive for metastatic carcinoma. ER/PR 95%, Ki-67 5%, HER2 negative 1+ by IHC positive lateral margin  Treatment plan: 1. lateral margin: Benign, AXLND: 25/26 LN Positive 2. Adj chemo with DD AC foll by taxol. 3. Adjuvant radiation therapy 4. Follow-up adjuvant antiestrogen therapy [facility] nausea study CT CAP: 11/26/2020: Degenerative changes no metastatic disease Bone scan: 11/26/2020: Emphysema, no evidence of metastatic disease [facility] nausea study and [facility] blood draw study ------------------------------------------------------------------------------------------------------------------------ Current treatment: Dose dense Adriamycin and Cytoxan cycle 1 day 8 Echocardiogram 12/03/2020: EF 50 to 55% low normal function We will plan another echocardiogram after 3 cycles of Adriamycin  Chemo toxicities: Mild nausea Fatigue   Overall she tolerated chemo extremely well. However given the thrombocytopenia, I will reduce the dosage of cycle 2 Return to clinic in 1 week for cycle 2    No orders of the defined types were placed in this encounter.  The patient has a good understanding of the overall plan. she agrees with it. she will call with any problems that may develop before the next visit  here.  Total time spent: 30 mins including face to face time and time spent for planning, charting and coordination of care  Lydia Eisenmenger, MD, MPH 12/13/2020  I, Molly Dorshimer, am acting as scribe for Dr. Nicholas Lose.  I have reviewed the above documentation for accuracy and completeness, and I agree with the above.

## 2020-12-12 NOTE — Progress Notes (Signed)
Called pt to introduce myself as her Arboriculturist and to discuss the [employer].  Pt's ins should pay for her treatment at 100% so copay assistance shouldn't be needed.  I left a msg requesting she return my call if she's interested in applying for the grant.

## 2020-12-13 ENCOUNTER — Inpatient Hospital Stay: Payer: Medicaid Other

## 2020-12-13 ENCOUNTER — Other Ambulatory Visit: Payer: Self-pay

## 2020-12-13 ENCOUNTER — Inpatient Hospital Stay (HOSPITAL_BASED_OUTPATIENT_CLINIC_OR_DEPARTMENT_OTHER): Payer: Medicaid Other | Admitting: Hematology and Oncology

## 2020-12-13 DIAGNOSIS — Z170 Estrogen receptor positive status [ER+]: Secondary | ICD-10-CM

## 2020-12-13 DIAGNOSIS — Z5111 Encounter for antineoplastic chemotherapy: Secondary | ICD-10-CM | POA: Diagnosis not present

## 2020-12-13 DIAGNOSIS — C50512 Malignant neoplasm of lower-outer quadrant of left female breast: Secondary | ICD-10-CM

## 2020-12-13 DIAGNOSIS — Z95828 Presence of other vascular implants and grafts: Secondary | ICD-10-CM | POA: Insufficient documentation

## 2020-12-13 LAB — CMP (CANCER CENTER ONLY)
ALT: 13 U/L (ref 0–44)
AST: 12 U/L — ABNORMAL LOW (ref 15–41)
Albumin: 3.5 g/dL (ref 3.5–5.0)
Alkaline Phosphatase: 98 U/L (ref 38–126)
Anion gap: 15 (ref 5–15)
BUN: 16 mg/dL (ref 6–20)
CO2: 26 mmol/L (ref 22–32)
Calcium: 9.1 mg/dL (ref 8.9–10.3)
Chloride: 98 mmol/L (ref 98–111)
Creatinine: 0.75 mg/dL (ref 0.44–1.00)
GFR, Estimated: >60 mL/min (ref >60)
Glucose, Bld: 113 mg/dL — ABNORMAL HIGH (ref 70–99)
Potassium: 3.4 mmol/L — ABNORMAL LOW (ref 3.5–5.1)
Sodium: 139 mmol/L (ref 135–145)
Total Bilirubin: 0.2 mg/dL — ABNORMAL LOW (ref 0.3–1.2)
Total Protein: 6.7 g/dL (ref 6.5–8.1)

## 2020-12-13 LAB — CBC WITH DIFFERENTIAL (CANCER CENTER ONLY)
Abs Immature Granulocytes: 0 10*3/uL (ref 0.00–0.07)
Basophils Absolute: 0.1 10*3/uL (ref 0.0–0.1)
Basophils Relative: 2 %
Eosinophils Absolute: 0.1 10*3/uL (ref 0.0–0.5)
Eosinophils Relative: 5 %
HCT: 38.7 % (ref 36.0–46.0)
Hemoglobin: 13 g/dL (ref 12.0–15.0)
Immature Granulocytes: 0 %
Lymphocytes Relative: 64 %
Lymphs Abs: 1.7 10*3/uL (ref 0.7–4.0)
MCH: 29 pg (ref 26.0–34.0)
MCHC: 33.6 g/dL (ref 30.0–36.0)
MCV: 86.2 fL (ref 80.0–100.0)
Monocytes Absolute: 0.2 10*3/uL (ref 0.1–1.0)
Monocytes Relative: 7 %
Neutro Abs: 0.6 10*3/uL — ABNORMAL LOW (ref 1.7–7.7)
Neutrophils Relative %: 22 %
Platelet Count: 84 10*3/uL — ABNORMAL LOW (ref 150–400)
RBC: 4.49 MIL/uL (ref 3.87–5.11)
RDW: 12.4 % (ref 11.5–15.5)
WBC Count: 2.6 10*3/uL — ABNORMAL LOW (ref 4.0–10.5)
nRBC: 0 % (ref 0.0–0.2)

## 2020-12-13 MED ORDER — SODIUM CHLORIDE 0.9% FLUSH
10.0000 mL | Freq: Once | INTRAVENOUS | Status: AC
  Administered 2020-12-13: 10 mL
  Filled 2020-12-13: qty 10

## 2020-12-13 MED ORDER — HEPARIN SOD (PORK) LOCK FLUSH 100 UNIT/ML IV SOLN
500.0000 [IU] | Freq: Once | INTRAVENOUS | Status: AC
  Administered 2020-12-13: 500 [IU]
  Filled 2020-12-13: qty 5

## 2020-12-13 NOTE — Assessment & Plan Note (Signed)
09/13/2020:Left lumpectomy ([place]): Grade 1 invasive lobular carcinoma, 1.5cm, 2/3 left axillary lymph nodes positive for metastatic carcinoma. ER/PR 95%, Ki-67 5%, HER2 negative 1+ by IHC positive lateral margin  Treatment plan: 1. lateral margin: Benign, AXLND: 25/26 LN Positive 2. Adj chemo with DD AC foll by taxol. 3. Adjuvant radiation therapy 4. Follow-up adjuvant antiestrogen therapy [facility] nausea study CT CAP: 11/26/2020: Degenerative changes no metastatic disease Bone scan: 11/26/2020: Emphysema, no evidence of metastatic disease [facility] nausea study and [facility] blood draw study ------------------------------------------------------------------------------------------------------------------------ Current treatment: Dose dense Adriamycin and Cytoxan cycle 1 day 8 Echocardiogram 12/03/2020: EF 50 to 55% low normal function We will plan another echocardiogram after 3 cycles of Adriamycin  Chemo toxicities:  Return to clinic in 1 week for cycle 2 

## 2020-12-16 ENCOUNTER — Encounter: Payer: Self-pay | Admitting: *Deleted

## 2020-12-17 ENCOUNTER — Telehealth: Payer: Self-pay | Admitting: Hematology and Oncology

## 2020-12-17 ENCOUNTER — Encounter: Payer: Self-pay | Admitting: *Deleted

## 2020-12-17 NOTE — Telephone Encounter (Signed)
Scheduled appts per 4/11 sch msg. Will have updated calendar printed for pt at next visit.

## 2020-12-19 MED FILL — Dexamethasone Sodium Phosphate Inj 100 MG/10ML: INTRAMUSCULAR | Qty: 1 | Status: AC

## 2020-12-19 MED FILL — Fosaprepitant Dimeglumine For IV Infusion 150 MG (Base Eq): INTRAVENOUS | Qty: 5 | Status: AC

## 2020-12-20 ENCOUNTER — Inpatient Hospital Stay: Payer: Medicaid Other

## 2020-12-20 ENCOUNTER — Other Ambulatory Visit: Payer: Medicaid Other

## 2020-12-23 ENCOUNTER — Other Ambulatory Visit: Payer: Self-pay | Admitting: Hematology and Oncology

## 2020-12-23 ENCOUNTER — Inpatient Hospital Stay: Payer: Medicaid Other

## 2020-12-23 ENCOUNTER — Telehealth: Payer: Self-pay | Admitting: Hematology and Oncology

## 2020-12-23 NOTE — Telephone Encounter (Signed)
R/s appts per 4/15 sch msg. Called pt, no answer. Left msg with appts date and times. Informed pt appts would be in [place]. Left phone number for pt to call back to confirm.

## 2020-12-24 ENCOUNTER — Telehealth: Payer: Self-pay

## 2020-12-24 NOTE — Telephone Encounter (Signed)
RN returned call to patient with number provided [phone].  No answer, no option to leave voicemail.

## 2020-12-27 ENCOUNTER — Inpatient Hospital Stay: Payer: Medicaid Other

## 2020-12-27 ENCOUNTER — Other Ambulatory Visit: Payer: Self-pay | Admitting: Hematology and Oncology

## 2020-12-27 ENCOUNTER — Other Ambulatory Visit: Payer: Self-pay

## 2020-12-27 VITALS — BP 123/89 | HR 99 | Temp 97.8°F | Resp 16

## 2020-12-27 DIAGNOSIS — Z5111 Encounter for antineoplastic chemotherapy: Secondary | ICD-10-CM | POA: Diagnosis not present

## 2020-12-27 DIAGNOSIS — C50512 Malignant neoplasm of lower-outer quadrant of left female breast: Secondary | ICD-10-CM

## 2020-12-27 DIAGNOSIS — E876 Hypokalemia: Secondary | ICD-10-CM

## 2020-12-27 LAB — CMP (CANCER CENTER ONLY)
ALT: 13 U/L (ref 0–44)
AST: 13 U/L — ABNORMAL LOW (ref 15–41)
Albumin: 4.1 g/dL (ref 3.5–5.0)
Alkaline Phosphatase: 89 U/L (ref 38–126)
Anion gap: 10 (ref 5–15)
BUN: 13 mg/dL (ref 6–20)
CO2: 29 mmol/L (ref 22–32)
Calcium: 9.8 mg/dL (ref 8.9–10.3)
Chloride: 99 mmol/L (ref 98–111)
Creatinine: 0.7 mg/dL (ref 0.44–1.00)
GFR, Estimated: >60 mL/min (ref >60)
Glucose, Bld: 109 mg/dL — ABNORMAL HIGH (ref 70–99)
Potassium: 3.2 mmol/L — ABNORMAL LOW (ref 3.5–5.1)
Sodium: 138 mmol/L (ref 135–145)
Total Bilirubin: 0.2 mg/dL — ABNORMAL LOW (ref 0.3–1.2)
Total Protein: 6.9 g/dL (ref 6.5–8.1)

## 2020-12-27 LAB — CBC WITH DIFFERENTIAL (CANCER CENTER ONLY)
Abs Immature Granulocytes: 0.07 10*3/uL (ref 0.00–0.07)
Basophils Absolute: 0.1 10*3/uL (ref 0.0–0.1)
Basophils Relative: 1 %
Eosinophils Absolute: 0.1 10*3/uL (ref 0.0–0.5)
Eosinophils Relative: 0 %
HCT: 37.3 % (ref 36.0–46.0)
Hemoglobin: 12.6 g/dL (ref 12.0–15.0)
Immature Granulocytes: 1 %
Lymphocytes Relative: 21 %
Lymphs Abs: 2.4 10*3/uL (ref 0.7–4.0)
MCH: 29.2 pg (ref 26.0–34.0)
MCHC: 33.8 g/dL (ref 30.0–36.0)
MCV: 86.5 fL (ref 80.0–100.0)
Monocytes Absolute: 0.8 10*3/uL (ref 0.1–1.0)
Monocytes Relative: 7 %
Neutro Abs: 8.1 10*3/uL — ABNORMAL HIGH (ref 1.7–7.7)
Neutrophils Relative %: 70 %
Platelet Count: 369 10*3/uL (ref 150–400)
RBC: 4.31 MIL/uL (ref 3.87–5.11)
RDW: 13.3 % (ref 11.5–15.5)
WBC Count: 11.5 10*3/uL — ABNORMAL HIGH (ref 4.0–10.5)
nRBC: 0 % (ref 0.0–0.2)

## 2020-12-27 MED ORDER — PALONOSETRON HCL INJECTION 0.25 MG/5ML
INTRAVENOUS | Status: AC
  Filled 2020-12-27: qty 5

## 2020-12-27 MED ORDER — SODIUM CHLORIDE 0.9 % IV SOLN
600.0000 mg/m2 | Freq: Once | INTRAVENOUS | Status: AC
  Administered 2020-12-27: 1120 mg via INTRAVENOUS
  Filled 2020-12-27: qty 56

## 2020-12-27 MED ORDER — DOXORUBICIN HCL CHEMO IV INJECTION 2 MG/ML
60.0000 mg/m2 | Freq: Once | INTRAVENOUS | Status: AC
  Administered 2020-12-27: 112 mg via INTRAVENOUS
  Filled 2020-12-27: qty 56

## 2020-12-27 MED ORDER — SODIUM CHLORIDE 0.9 % IV SOLN
10.0000 mg | Freq: Once | INTRAVENOUS | Status: AC
  Administered 2020-12-27: 10 mg via INTRAVENOUS
  Filled 2020-12-27: qty 10

## 2020-12-27 MED ORDER — POTASSIUM CHLORIDE 10 MEQ/100ML IV SOLN
10.0000 meq | INTRAVENOUS | Status: AC
Start: 2020-12-27 — End: 2020-12-27

## 2020-12-27 MED ORDER — SODIUM CHLORIDE 0.9% FLUSH
10.0000 mL | INTRAVENOUS | Status: AC | PRN
  Administered 2020-12-27: 10 mL
  Filled 2020-12-27: qty 10

## 2020-12-27 MED ORDER — HEPARIN SOD (PORK) LOCK FLUSH 100 UNIT/ML IV SOLN
500.0000 [IU] | Freq: Once | INTRAVENOUS | Status: AC | PRN
  Administered 2020-12-27: 500 [IU]
  Filled 2020-12-27: qty 5

## 2020-12-27 MED ORDER — HYDROCODONE-ACETAMINOPHEN 5-325 MG PO TABS: 1.0000 | ORAL_TABLET | Freq: Four times a day (QID) | ORAL | 0 refills | Status: DC | PRN

## 2020-12-27 MED ORDER — SODIUM CHLORIDE 0.9 % IV SOLN
150.0000 mg | Freq: Once | INTRAVENOUS | Status: AC
  Administered 2020-12-27: 150 mg via INTRAVENOUS
  Filled 2020-12-27: qty 150

## 2020-12-27 MED ORDER — SODIUM CHLORIDE 0.9 % IV SOLN
Freq: Once | INTRAVENOUS | Status: AC
Start: 2020-12-27 — End: 2020-12-27
  Filled 2020-12-27: qty 250

## 2020-12-27 MED ORDER — PALONOSETRON HCL INJECTION 0.25 MG/5ML
0.2500 mg | Freq: Once | INTRAVENOUS | Status: AC
  Administered 2020-12-27: 0.25 mg via INTRAVENOUS

## 2020-12-27 MED ORDER — SODIUM CHLORIDE 0.9 % IV SOLN
Freq: Once | INTRAVENOUS | Status: AC
  Filled 2020-12-27: qty 250

## 2020-12-27 NOTE — Patient Instructions (Signed)

## 2020-12-27 NOTE — Progress Notes (Signed)
Okay to proceed with full dose chemotherapy per Dr. Lorenso Courier (on call MD) after conferring with breast pod MD team. G-CSF scheduled for Monday.

## 2020-12-27 NOTE — Patient Instructions (Signed)
Cyclophosphamide Injection What is this medicine? CYCLOPHOSPHAMIDE (sye kloe FOSS fa mide) is a chemotherapy drug. It slows the growth of cancer cells. This medicine is used to treat many types of cancer like lymphoma, myeloma, leukemia, breast cancer, and ovarian cancer, to name a few. This medicine may be used for other purposes; ask your health care provider or pharmacist if you have questions. COMMON BRAND NAME(S): Cytoxan, Neosar What should I tell my health care provider before I take this medicine? They need to know if you have any of these conditions:  heart disease  history of irregular heartbeat  infection  kidney disease  liver disease  low blood counts, like white cells, platelets, or red blood cells  on hemodialysis  recent or ongoing radiation therapy  scarring or thickening of the lungs  trouble passing urine  an unusual or allergic reaction to cyclophosphamide, other medicines, foods, dyes, or preservatives  pregnant or trying to get pregnant  breast-feeding How should I use this medicine? This drug is usually given as an injection into a vein or muscle or by infusion into a vein. It is administered in a hospital or clinic by a specially trained health care professional. Talk to your pediatrician regarding the use of this medicine in children. Special care may be needed. Overdosage: If you think you have taken too much of this medicine contact a poison control center or emergency room at once. NOTE: This medicine is only for you. Do not share this medicine with others. What if I miss a dose? It is important not to miss your dose. Call your doctor or health care professional if you are unable to keep an appointment. What may interact with this medicine?  amphotericin B  azathioprine  certain antivirals for HIV or hepatitis  certain medicines for blood pressure, heart disease, irregular heart beat  certain medicines that treat or prevent blood clots  like warfarin  certain other medicines for cancer  cyclosporine  etanercept  indomethacin  medicines that relax muscles for surgery  medicines to increase blood counts  metronidazole This list may not describe all possible interactions. Give your health care provider a list of all the medicines, herbs, non-prescription drugs, or dietary supplements you use. Also tell them if you smoke, drink alcohol, or use illegal drugs. Some items may interact with your medicine. What should I watch for while using this medicine? Your condition will be monitored carefully while you are receiving this medicine. You may need blood work done while you are taking this medicine. Drink water or other fluids as directed. Urinate often, even at night. Some products may contain alcohol. Ask your health care professional if this medicine contains alcohol. Be sure to tell all health care professionals you are taking this medicine. Certain medicines, like metronidazole and disulfiram, can cause an unpleasant reaction when taken with alcohol. The reaction includes flushing, headache, nausea, vomiting, sweating, and increased thirst. The reaction can last from 30 minutes to several hours. Do not become pregnant while taking this medicine or for 1 year after stopping it. Women should inform their health care professional if they wish to become pregnant or think they might be pregnant. Men should not father a child while taking this medicine and for 4 months after stopping it. There is potential for serious side effects to an unborn child. Talk to your health care professional for more information. Do not breast-feed an infant while taking this medicine or for 1 week after stopping it. This medicine has  caused ovarian failure in some women. This medicine may make it more difficult to get pregnant. Talk to your health care professional if you are concerned about your fertility. This medicine has caused decreased sperm  counts in some men. This may make it more difficult to father a child. Talk to your health care professional if you are concerned about your fertility. Call your health care professional for advice if you get a fever, chills, or sore throat, or other symptoms of a cold or flu. Do not treat yourself. This medicine decreases your body's ability to fight infections. Try to avoid being around people who are sick. Avoid taking medicines that contain aspirin, acetaminophen, ibuprofen, naproxen, or ketoprofen unless instructed by your health care professional. These medicines may hide a fever. Talk to your health care professional about your risk of cancer. You may be more at risk for certain types of cancer if you take this medicine. If you are going to need surgery or other procedure, tell your health care professional that you are using this medicine. Be careful brushing or flossing your teeth or using a toothpick because you may get an infection or bleed more easily. If you have any dental work done, tell your dentist you are receiving this medicine. What side effects may I notice from receiving this medicine? Side effects that you should report to your doctor or health care professional as soon as possible:  allergic reactions like skin rash, itching or hives, swelling of the face, lips, or tongue  breathing problems  nausea, vomiting  signs and symptoms of bleeding such as bloody or black, tarry stools; red or dark brown urine; spitting up blood or brown material that looks like coffee grounds; red spots on the skin; unusual bruising or bleeding from the eyes, gums, or nose  signs and symptoms of heart failure like fast, irregular heartbeat, sudden weight gain; swelling of the ankles, feet, hands  signs and symptoms of infection like fever; chills; cough; sore throat; pain or trouble passing urine  signs and symptoms of kidney injury like trouble passing urine or change in the amount of  urine  signs and symptoms of liver injury like dark yellow or brown urine; general ill feeling or flu-like symptoms; light-colored stools; loss of appetite; nausea; right upper belly pain; unusually weak or tired; yellowing of the eyes or skin Side effects that usually do not require medical attention (report to your doctor or health care professional if they continue or are bothersome):  confusion  decreased hearing  diarrhea  facial flushing  hair loss  headache  loss of appetite  missed menstrual periods  signs and symptoms of low red blood cells or anemia such as unusually weak or tired; feeling faint or lightheaded; falls  skin discoloration This list may not describe all possible side effects. Call your doctor for medical advice about side effects. You may report side effects to FDA at 1-800-FDA-1088. Where should I keep my medicine? This drug is given in a hospital or clinic and will not be stored at home. NOTE: This sheet is a summary. It may not cover all possible information. If you have questions about this medicine, talk to your doctor, pharmacist, or health care provider.  2021 Elsevier/Gold Standard (2019-05-29 09:53:29) Doxorubicin injection What is this medicine? DOXORUBICIN (dox oh ROO bi sin) is a chemotherapy drug. It is used to treat many kinds of cancer like leukemia, lymphoma, neuroblastoma, sarcoma, and Wilms' tumor. It is also used to treat bladder  cancer, breast cancer, lung cancer, ovarian cancer, stomach cancer, and thyroid cancer. This medicine may be used for other purposes; ask your health care provider or pharmacist if you have questions. COMMON BRAND NAME(S): Adriamycin, Adriamycin PFS, Adriamycin RDF, Rubex What should I tell my health care provider before I take this medicine? They need to know if you have any of these conditions:  heart disease  history of low blood counts caused by a medicine  liver disease  recent or ongoing radiation  therapy  an unusual or allergic reaction to doxorubicin, other chemotherapy agents, other medicines, foods, dyes, or preservatives  pregnant or trying to get pregnant  breast-feeding How should I use this medicine? This drug is given as an infusion into a vein. It is administered in a hospital or clinic by a specially trained health care professional. If you have pain, swelling, burning or any unusual feeling around the site of your injection, tell your health care professional right away. Talk to your pediatrician regarding the use of this medicine in children. Special care may be needed. Overdosage: If you think you have taken too much of this medicine contact a poison control center or emergency room at once. NOTE: This medicine is only for you. Do not share this medicine with others. What if I miss a dose? It is important not to miss your dose. Call your doctor or health care professional if you are unable to keep an appointment. What may interact with this medicine? This medicine may interact with the following medications:  6-mercaptopurine  paclitaxel  phenytoin  St. John's Wort  trastuzumab  verapamil This list may not describe all possible interactions. Give your health care provider a list of all the medicines, herbs, non-prescription drugs, or dietary supplements you use. Also tell them if you smoke, drink alcohol, or use illegal drugs. Some items may interact with your medicine. What should I watch for while using this medicine? This drug may make you feel generally unwell. This is not uncommon, as chemotherapy can affect healthy cells as well as cancer cells. Report any side effects. Continue your course of treatment even though you feel ill unless your doctor tells you to stop. There is a maximum amount of this medicine you should receive throughout your life. The amount depends on the medical condition being treated and your overall health. Your doctor will watch how  much of this medicine you receive in your lifetime. Tell your doctor if you have taken this medicine before. You may need blood work done while you are taking this medicine. Your urine may turn red for a few days after your dose. This is not blood. If your urine is dark or brown, call your doctor. In some cases, you may be given additional medicines to help with side effects. Follow all directions for their use. Call your doctor or health care professional for advice if you get a fever, chills or sore throat, or other symptoms of a cold or flu. Do not treat yourself. This drug decreases your body's ability to fight infections. Try to avoid being around people who are sick. This medicine may increase your risk to bruise or bleed. Call your doctor or health care professional if you notice any unusual bleeding. Talk to your doctor about your risk of cancer. You may be more at risk for certain types of cancers if you take this medicine. Do not become pregnant while taking this medicine or for 6 months after stopping it. Women should inform   their doctor if they wish to become pregnant or think they might be pregnant. Men should not father a child while taking this medicine and for 6 months after stopping it. There is a potential for serious side effects to an unborn child. Talk to your health care professional or pharmacist for more information. Do not breast-feed an infant while taking this medicine. This medicine has caused ovarian failure in some women and reduced sperm counts in some men This medicine may interfere with the ability to have a child. Talk with your doctor or health care professional if you are concerned about your fertility. This medicine may cause a decrease in Co-Enzyme Q-10. You should make sure that you get enough Co-Enzyme Q-10 while you are taking this medicine. Discuss the foods you eat and the vitamins you take with your health care professional. What side effects may I notice from  receiving this medicine? Side effects that you should report to your doctor or health care professional as soon as possible:  allergic reactions like skin rash, itching or hives, swelling of the face, lips, or tongue  breathing problems  chest pain  fast or irregular heartbeat  low blood counts - this medicine may decrease the number of white blood cells, red blood cells and platelets. You may be at increased risk for infections and bleeding.  pain, redness, or irritation at site where injected  signs of infection - fever or chills, cough, sore throat, pain or difficulty passing urine  signs of decreased platelets or bleeding - bruising, pinpoint red spots on the skin, black, tarry stools, blood in the urine  swelling of the ankles, feet, hands  tiredness  weakness Side effects that usually do not require medical attention (report to your doctor or health care professional if they continue or are bothersome):  diarrhea  hair loss  mouth sores  nail discoloration or damage  nausea  red colored urine  vomiting This list may not describe all possible side effects. Call your doctor for medical advice about side effects. You may report side effects to FDA at 1-800-FDA-1088. Where should I keep my medicine? This drug is given in a hospital or clinic and will not be stored at home. NOTE: This sheet is a summary. It may not cover all possible information. If you have questions about this medicine, talk to your doctor, pharmacist, or health care provider.  2021 Elsevier/Gold Standard (2017-04-07 11:01:26) Palonosetron Injection What is this medicine? PALONOSETRON (pal oh NOE se tron) is used to prevent nausea and vomiting caused by chemotherapy. It also helps prevent delayed nausea and vomiting that may occur a few days after your treatment. This medicine may be used for other purposes; ask your health care provider or pharmacist if you have questions. COMMON BRAND NAME(S):  Aloxi What should I tell my health care provider before I take this medicine? They need to know if you have any of these conditions:  an unusual or allergic reaction to palonosetron, dolasetron, granisetron, ondansetron, other medicines, foods, dyes, or preservatives  pregnant or trying to get pregnant  breast-feeding How should I use this medicine? This medicine is for infusion into a vein. It is given by a health care professional in a hospital or clinic setting. Talk to your pediatrician regarding the use of this medicine in children. While this drug may be prescribed for children as young as 1 month for selected conditions, precautions do apply. Overdosage: If you think you have taken too much of this medicine  contact a poison control center or emergency room at once. NOTE: This medicine is only for you. Do not share this medicine with others. What if I miss a dose? This does not apply. What may interact with this medicine?  certain medicines for depression, anxiety, or psychotic disturbances  fentanyl  linezolid  MAOIs like Carbex, Eldepryl, Marplan, Nardil, and Parnate  methylene blue (injected into a vein)  tramadol This list may not describe all possible interactions. Give your health care provider a list of all the medicines, herbs, non-prescription drugs, or dietary supplements you use. Also tell them if you smoke, drink alcohol, or use illegal drugs. Some items may interact with your medicine. What should I watch for while using this medicine? Your condition will be monitored carefully while you are receiving this medicine. What side effects may I notice from receiving this medicine? Side effects that you should report to your doctor or health care professional as soon as possible:  allergic reactions like skin rash, itching or hives, swelling of the face, lips, or tongue  breathing problems  confusion  dizziness  fast, irregular heartbeat  fever and  chills  loss of balance or coordination  seizures  sweating  swelling of the hands and feet  tremors  unusually weak or tired Side effects that usually do not require medical attention (report to your doctor or health care professional if they continue or are bothersome):  constipation or diarrhea  headache This list may not describe all possible side effects. Call your doctor for medical advice about side effects. You may report side effects to FDA at 1-800-FDA-1088. Where should I keep my medicine? This drug is given in a hospital or clinic and will not be stored at home. NOTE: This sheet is a summary. It may not cover all possible information. If you have questions about this medicine, talk to your doctor, pharmacist, or health care provider.  2021 Elsevier/Gold Standard (2013-06-30 10:38:36)

## 2020-12-30 ENCOUNTER — Telehealth: Payer: Self-pay

## 2020-12-30 ENCOUNTER — Ambulatory Visit: Payer: Medicaid Other

## 2020-12-30 ENCOUNTER — Encounter: Payer: Self-pay | Admitting: *Deleted

## 2020-12-30 ENCOUNTER — Telehealth: Payer: Self-pay | Admitting: *Deleted

## 2020-12-30 NOTE — Telephone Encounter (Signed)
RN attempt to call pt on her personal cell phone, significant others cell phone, and hotel room regarding missed injection apt today.  No answer, LVM for pt to call the office back today.

## 2020-12-30 NOTE — Telephone Encounter (Signed)
Pt called and stated she didn't show this morning for her appt due to a conflict and we r/s her appt to thurs   Lydia Collins

## 2020-12-31 ENCOUNTER — Telehealth: Payer: Self-pay | Admitting: *Deleted

## 2020-12-31 ENCOUNTER — Telehealth: Payer: Self-pay | Admitting: Hematology and Oncology

## 2020-12-31 NOTE — Telephone Encounter (Signed)
Cancelled appts per 4/26 sch msg. Called pt, no answer. Left msg with appts date and times.

## 2020-12-31 NOTE — Telephone Encounter (Signed)
RN second attempt to contact pt on all numbers in the chart regarding missed injection apt yesterday.  No answer.  LVM to return call to the office.  Notified MD who verbalized understanding. No new orders received at this time.

## 2021-01-02 ENCOUNTER — Other Ambulatory Visit: Payer: Self-pay | Admitting: Pharmacist

## 2021-01-02 ENCOUNTER — Inpatient Hospital Stay: Payer: Medicaid Other

## 2021-01-02 ENCOUNTER — Other Ambulatory Visit: Payer: Self-pay | Admitting: Hematology and Oncology

## 2021-01-02 DIAGNOSIS — C50512 Malignant neoplasm of lower-outer quadrant of left female breast: Secondary | ICD-10-CM

## 2021-01-03 ENCOUNTER — Ambulatory Visit: Payer: Medicaid Other

## 2021-01-03 ENCOUNTER — Other Ambulatory Visit: Payer: Medicaid Other

## 2021-01-03 ENCOUNTER — Telehealth: Payer: Self-pay | Admitting: *Deleted

## 2021-01-03 ENCOUNTER — Ambulatory Visit: Payer: Medicaid Other | Admitting: Adult Health

## 2021-01-06 ENCOUNTER — Ambulatory Visit: Payer: Medicaid Other

## 2021-01-06 ENCOUNTER — Telehealth: Payer: Self-pay | Admitting: *Deleted

## 2021-01-06 NOTE — Telephone Encounter (Signed)
Received call from pt with complaint of constipation x3 days. Per MD pt to take Miralax daily and insert glycerin suppository to alleviate constipation.  If pt does not have a bowl movement within the next 24 hours after taking Miralax and glycerin suppository, pt educated to take Milk of Magnesium as directed on the bottle. Pt verbalized understanding and appreciative of advice.

## 2021-01-07 ENCOUNTER — Other Ambulatory Visit: Payer: Self-pay | Admitting: Hematology and Oncology

## 2021-01-07 ENCOUNTER — Telehealth: Payer: Self-pay

## 2021-01-07 NOTE — Telephone Encounter (Signed)
Patient called to request refill on Norco.  Patient also updating that she has had significant improvement with her bowel movements.   RN reviewed with MD - MD will no longer refill Norco.  RN notified patient, patient verbalized understanding.  No further needs at this time.

## 2021-01-07 NOTE — Progress Notes (Signed)
I do not recommend any further narcotic pain medications for her. She can take over-the-counter Tylenol/Advil for pain.

## 2021-01-14 ENCOUNTER — Telehealth: Payer: Self-pay | Admitting: *Deleted

## 2021-01-14 NOTE — Telephone Encounter (Signed)
Received call from pt stating she has a hx of opoid abuse and noticed she was starting to abuse opoid's again.  Pt states she has obtained help with the Methadone clinic and is currently on 20 mg p.o Methadone daily.  Pt wanting to alert MD and requesting advice if okay to proceed with Methadone tx.  Per MD pt able to take Methadone while receiving tx.  Pt appreciative of advice.

## 2021-01-15 ENCOUNTER — Encounter: Payer: Self-pay | Admitting: *Deleted

## 2021-01-15 NOTE — Progress Notes (Signed)
Received fax from [facility] requesting MD signature on prescription form and recent office notes to provide pt with incontinence supplies.  Form signed by MD and RN successfully faxed with recent office notes to [phone]).

## 2021-01-16 NOTE — Assessment & Plan Note (Signed)
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 ER/PR 95%, Ki-67 5%, HER2 negative 1+ by IHC positive lateral margin  Treatment plan: 1. lateral margin: Benign, AXLND: 25/26 LN Positive 2. Adj chemo with DD AC foll by taxol. 3. Adjuvant radiation therapy 4. Follow-up adjuvant antiestrogen therapy [facility] nausea study CT CAP: 11/26/2020: Degenerative changes no metastatic disease Bone scan: 11/26/2020: Emphysema, no evidence of metastatic disease [facility]nausea studyand [facility] blood draw study ------------------------------------------------------------------------------------------------------------------------ Current treatment:Dose dense Adriamycin and Cytoxan cycle 2 Echocardiogram3/29/2022: EF 50 to 55% low normal function We will plan another echocardiogram after 3 cycles of Adriamycin  Chemo toxicities: Mild nausea Fatigue  Overall she tolerated chemo extremely well. However given the thrombocytopenia, I will reduce the dosage of cycle 2 Return to clinic in 2 weeks for cycle 3

## 2021-01-16 NOTE — Progress Notes (Signed)
Patient Care Team: Patient, [name] as PCP - General (General Practice) [name], RN as Oncology Nurse Navigator [name], RN as Oncology Nurse Navigator  DIAGNOSIS:    ICD-10-CM   1. Malignant neoplasm of lower-outer quadrant of left breast of female, estrogen receptor positive ([place])  C50.512    Z17.0     SUMMARY OF ONCOLOGIC HISTORY: Oncology History  Malignant neoplasm of lower-outer quadrant of left breast of female, estrogen receptor positive ([place])  07/23/2020 Initial Diagnosis   Screening mammogram showed a left breast distortion. Diagnostic mammogram showed a mass at the 4 o'clock position in the left breast, 0.8cm, and 2 adjacent masses in the left breast at the 2 o'clock position, 0.7cm and 0.6cm, and no left axillary lymphadenopathy. Biopsy showed no evidence of malignancy at the 2 o'clock position, and IDC at the 4 o'clock position, grade 1, HER-2 negative (1+), ER/PR+ 95%, Ki67 5%.    09/13/2020 Surgery   Left lumpectomy [name]): invasive lobular carcinoma, 1.5cm, 2 left axillary lymph nodes positive for metastatic carcinoma.   11/08/2020 Surgery   Re-excision and left axillary lymph node dissection [name]): no residual carcinoma in the left breast, 25/26 lymph nodes positive for carcinoma   12/06/2020 -  Chemotherapy    Patient is on Treatment Plan: BREAST ADJUVANT DOSE DENSE AC Q14D / PACLITAXEL Q7D        CHIEF COMPLIANT: Cycle 3 Adriamycin and Cytoxan  INTERVAL HISTORY: Lydia Collins is a 55 y.o. with above-mentioned history of left breast cancerwhounderwent a left lumpectomyfollowed by re-excision and left axillary lymph node dissectionand is currently on adjuvant chemotherapy with dose dense Adriamycin and Cytoxan.She presents to the clinic todayfor a toxicity check and cycle 3.   She tolerated last cycle of chemotherapy extremely well without any nausea or vomiting or any other major side effects.  She lost her hair.   ALLERGIES:   is allergic to ibuprofen and naproxen.  MEDICATIONS:  Current Outpatient Medications  Medication Sig Dispense Refill  . cetirizine (ZYRTEC) 10 MG tablet Take 10 mg by mouth daily.    . chlorthalidone (HYGROTON) 25 MG tablet Take 25 mg by mouth every morning.    . clonazePAM (KLONOPIN) 0.5 MG tablet Take 0.5 mg by mouth 2 (two) times daily as needed for anxiety.    Marland Kitchen dexamethasone (DECADRON) 4 MG tablet Take 1 tablet (4 mg total) by mouth daily. Take 1 tablet day after chemo and 1 tablet 2 days after chemo with food 8 tablet 0  . DULoxetine (CYMBALTA) 30 MG capsule Take 30 mg by mouth daily.    Marland Kitchen gabapentin (NEURONTIN) 100 MG capsule Take 3 capsules (300 mg total) by mouth at bedtime. 90 capsule 3  . lidocaine-prilocaine (EMLA) cream Apply to affected area once 30 g 3  . methadone (DOLOPHINE) 10 MG tablet Take 20 mg by mouth every 12 (twelve) hours.    . ondansetron (ZOFRAN) 8 MG tablet TAKE 1 TABLET (8 MG TOTAL) BY MOUTH 2 (TWO) TIMES DAILY AS NEEDED. START ON THE THIRD DAY AFTER CHEMOTHERAPY. 30 tablet 1  . prochlorperazine (COMPAZINE) 10 MG tablet Take 1 tablet (10 mg total) by mouth every 6 (six) hours as needed (Nausea or vomiting). 30 tablet 1  . traMADol (ULTRAM) 50 MG tablet Take 1-2 tablets (50-100 mg total) by mouth every 6 (six) hours as needed. 10 tablet 0   No current facility-administered medications for this visit.   Facility-Administered Medications Ordered in Other Visits  Medication  Dose Route Frequency Provider Last Rate Last Admin  . sodium chloride flush (NS) 0.9 % injection 10 mL  10 mL Intracatheter PRN Nicholas Lose, MD   10 mL at 12/27/20 1640    PHYSICAL EXAMINATION: ECOG PERFORMANCE STATUS: 1 - Symptomatic but completely ambulatory  There were no vitals filed for this visit. There were no vitals filed for this visit.  LABORATORY DATA:  I have reviewed the data as listed CMP Latest Ref Rng & Units 12/27/2020 12/13/2020 12/06/2020  Glucose 70 - 99 mg/dL 109(H)  113(H) 112(H)  BUN 6 - 20 mg/dL _0 Creatinine 0.44 - 1.00 mg/dL 0.70 0.75 0.71  Sodium 135 - 145 mmol/L 138 139 142  Potassium 3.5 - 5.1 mmol/L 3.2(L) 3.4(L) 3.4(L)  Chloride 98 - 111 mmol/L 99 98 106  CO2 22 - 32 mmol/L _1 Calcium 8.9 - 10.3 mg/dL 9.8 9.1 8.8(L)  Total Protein 6.5 - 8.1 g/dL 6.9 6.7 7.2  Total Bilirubin 0.3 - 1.2 mg/dL 0.2(L) 0.2(L) 0.3  Alkaline Phos 38 - 126 U/L 89 98 92  AST 15 - 41 U/L 13(L) 12(L) 13(L)  ALT 0 - 44 U/L _2 Lab Results  Component Value Date   WBC 11.5 (H) 12/27/2020   HGB 12.6 12/27/2020   HCT 37.3 12/27/2020   MCV 86.5 12/27/2020   PLT 369 12/27/2020   NEUTROABS 8.1 (H) 12/27/2020    ASSESSMENT & PLAN:  Malignant neoplasm of lower-outer quadrant of left breast of female, estrogen receptor positive ([place]) 09/13/2020:Left lumpectomy [name]): Grade 1 invasive lobular carcinoma, 1.5cm, 2/3 left axillary lymph nodes positive for metastatic carcinoma. ER/PR 95%, Ki-67 5%, HER2 negative 1+ by IHC positive lateral margin  Treatment plan: 1. lateral margin: Benign, AXLND: 25/26 LN Positive 2. Adj chemo with DD AC foll by taxol. 3. Adjuvant radiation therapy 4. Follow-up adjuvant antiestrogen therapy URCC nausea study CT CAP: 11/26/2020: Degenerative changes no metastatic disease Bone scan: 11/26/2020: Emphysema, no evidence of metastatic disease [facility]RCCnausea studyand [facility] blood draw study ------------------------------------------------------------------------------------------------------------------------ Current treatment:Dose dense Adriamycin and Cytoxan cycle 2 Echocardiogram3/29/2022: EF 50 to 55% low normal function We will plan another echocardiogram after 3 cycles of Adriamycin  Chemo toxicities: Mild nausea Fatigue Hypokalemia: On potassium supplement  Monitoring closely for toxicities We discontinue narcotics.  She is currently on methadone.  Overall she tolerated chemo extremely well. we reduced  the dosage of cycle 2 Return to clinic in 2 weeks for cycle 3    No orders of the defined types were placed in this encounter.  The patient has a good understanding of the overall plan. she agrees with it. she will call with any problems that may develop before the next visit here.  Total time spent: 30 mins including face to face time and time spent for planning, charting and coordination of care  Rulon Eisenmenger, MD, MPH 01/17/2021  I, Molly Dorshimer, am acting as scribe for Dr. Nicholas Lose.  I have reviewed the above documentation for accuracy and completeness, and I agree with the above.

## 2021-01-17 ENCOUNTER — Other Ambulatory Visit: Payer: Self-pay | Admitting: Hematology and Oncology

## 2021-01-17 ENCOUNTER — Other Ambulatory Visit: Payer: Self-pay

## 2021-01-17 ENCOUNTER — Inpatient Hospital Stay: Payer: Medicaid Other | Attending: Hematology and Oncology

## 2021-01-17 ENCOUNTER — Other Ambulatory Visit: Payer: Medicaid Other

## 2021-01-17 ENCOUNTER — Inpatient Hospital Stay: Payer: Medicaid Other

## 2021-01-17 ENCOUNTER — Inpatient Hospital Stay (HOSPITAL_BASED_OUTPATIENT_CLINIC_OR_DEPARTMENT_OTHER): Payer: Medicaid Other | Admitting: Hematology and Oncology

## 2021-01-17 DIAGNOSIS — C773 Secondary and unspecified malignant neoplasm of axilla and upper limb lymph nodes: Secondary | ICD-10-CM | POA: Diagnosis present

## 2021-01-17 DIAGNOSIS — C50512 Malignant neoplasm of lower-outer quadrant of left female breast: Secondary | ICD-10-CM

## 2021-01-17 DIAGNOSIS — Z5111 Encounter for antineoplastic chemotherapy: Secondary | ICD-10-CM | POA: Insufficient documentation

## 2021-01-17 DIAGNOSIS — Z170 Estrogen receptor positive status [ER+]: Secondary | ICD-10-CM | POA: Diagnosis not present

## 2021-01-17 DIAGNOSIS — Z95828 Presence of other vascular implants and grafts: Secondary | ICD-10-CM

## 2021-01-17 LAB — CBC WITH DIFFERENTIAL (CANCER CENTER ONLY)
Abs Immature Granulocytes: 0.06 10*3/uL (ref 0.00–0.07)
Basophils Absolute: 0.1 10*3/uL (ref 0.0–0.1)
Basophils Relative: 1 %
Eosinophils Absolute: 0.1 10*3/uL (ref 0.0–0.5)
Eosinophils Relative: 1 %
HCT: 35.9 % — ABNORMAL LOW (ref 36.0–46.0)
Hemoglobin: 12.1 g/dL (ref 12.0–15.0)
Immature Granulocytes: 1 %
Lymphocytes Relative: 24 %
Lymphs Abs: 1.8 10*3/uL (ref 0.7–4.0)
MCH: 29.9 pg (ref 26.0–34.0)
MCHC: 33.7 g/dL (ref 30.0–36.0)
MCV: 88.6 fL (ref 80.0–100.0)
Monocytes Absolute: 0.8 10*3/uL (ref 0.1–1.0)
Monocytes Relative: 11 %
Neutro Abs: 4.8 10*3/uL (ref 1.7–7.7)
Neutrophils Relative %: 62 %
Platelet Count: 384 10*3/uL (ref 150–400)
RBC: 4.05 MIL/uL (ref 3.87–5.11)
RDW: 14.4 % (ref 11.5–15.5)
WBC Count: 7.7 10*3/uL (ref 4.0–10.5)
nRBC: 0.3 % — ABNORMAL HIGH (ref 0.0–0.2)

## 2021-01-17 LAB — CMP (CANCER CENTER ONLY)
ALT: 10 U/L (ref 0–44)
AST: 18 U/L (ref 15–41)
Albumin: 3.6 g/dL (ref 3.5–5.0)
Alkaline Phosphatase: 95 U/L (ref 38–126)
Anion gap: 9 (ref 5–15)
BUN: 9 mg/dL (ref 6–20)
CO2: 30 mmol/L (ref 22–32)
Calcium: 9.4 mg/dL (ref 8.9–10.3)
Chloride: 99 mmol/L (ref 98–111)
Creatinine: 0.78 mg/dL (ref 0.44–1.00)
GFR, Estimated: >60 mL/min (ref >60)
Glucose, Bld: 113 mg/dL — ABNORMAL HIGH (ref 70–99)
Potassium: 3.6 mmol/L (ref 3.5–5.1)
Sodium: 138 mmol/L (ref 135–145)
Total Bilirubin: <0.2 mg/dL — ABNORMAL LOW (ref 0.3–1.2)
Total Protein: 7.1 g/dL (ref 6.5–8.1)

## 2021-01-17 MED ORDER — SODIUM CHLORIDE 0.9 % IV SOLN
10.0000 mg | Freq: Once | INTRAVENOUS | Status: AC
  Administered 2021-01-17: 10 mg via INTRAVENOUS
  Filled 2021-01-17: qty 10

## 2021-01-17 MED ORDER — SODIUM CHLORIDE 0.9 % IV SOLN
Freq: Once | INTRAVENOUS | Status: AC
  Filled 2021-01-17: qty 250

## 2021-01-17 MED ORDER — HEPARIN SOD (PORK) LOCK FLUSH 100 UNIT/ML IV SOLN
500.0000 [IU] | Freq: Once | INTRAVENOUS | Status: AC | PRN
  Administered 2021-01-17: 500 [IU]
  Filled 2021-01-17: qty 5

## 2021-01-17 MED ORDER — PALONOSETRON HCL INJECTION 0.25 MG/5ML
0.2500 mg | Freq: Once | INTRAVENOUS | Status: AC
  Administered 2021-01-17: 0.25 mg via INTRAVENOUS

## 2021-01-17 MED ORDER — PALONOSETRON HCL INJECTION 0.25 MG/5ML
INTRAVENOUS | Status: AC
  Filled 2021-01-17: qty 5

## 2021-01-17 MED ORDER — SODIUM CHLORIDE 0.9% FLUSH
10.0000 mL | INTRAVENOUS | Status: DC | PRN
  Administered 2021-01-17: 10 mL
  Filled 2021-01-17: qty 10

## 2021-01-17 MED ORDER — CYCLOPHOSPHAMIDE CHEMO INJECTION 1 GM
600.0000 mg/m2 | Freq: Once | INTRAMUSCULAR | Status: AC
  Administered 2021-01-17: 1120 mg via INTRAVENOUS
  Filled 2021-01-17: qty 56

## 2021-01-17 MED ORDER — SODIUM CHLORIDE 0.9 % IV SOLN
150.0000 mg | Freq: Once | INTRAVENOUS | Status: AC
  Administered 2021-01-17: 150 mg via INTRAVENOUS
  Filled 2021-01-17: qty 150

## 2021-01-17 MED ORDER — DOXORUBICIN HCL CHEMO IV INJECTION 2 MG/ML
60.0000 mg/m2 | Freq: Once | INTRAVENOUS | Status: AC
  Administered 2021-01-17: 112 mg via INTRAVENOUS
  Filled 2021-01-17: qty 56

## 2021-01-17 MED ORDER — SODIUM CHLORIDE 0.9% FLUSH
10.0000 mL | Freq: Once | INTRAVENOUS | Status: AC
  Administered 2021-01-17: 10 mL
  Filled 2021-01-17: qty 10

## 2021-01-17 NOTE — Patient Instructions (Addendum)
[facility]COL[facility]  Discharge Instructions: Thank you for choosing [facility] to provide your oncology and hematology care.   If you have a lab appointment with the [facility], please go directly to the [facility] and check in at the registration area.   Wear comfortable clothing and clothing appropriate for easy access to any Portacath or PICC line.   We strive to give you quality time with your provider. You may need to reschedule your appointment if you arrive late (15 or more minutes).  Arriving late affects you and other patients whose appointments are after yours.  Also, if you miss three or more appointments without notifying the office, you may be dismissed from the clinic at the provider's discretion.      For prescription refill requests, have your pharmacy contact our office and allow 72 hours for refills to be completed.    Today you received the following chemotherapy and/or immunotherapy agents Cytoxan/Doxorubicin Doxorubicin injection What is this medicine? DOXORUBICIN (dox oh ROO bi sin) is a chemotherapy drug. It is used to treat many kinds of cancer like leukemia, lymphoma, neuroblastoma, sarcoma, and Wilms' tumor. It is also used to treat bladder cancer, breast cancer, lung cancer, ovarian cancer, stomach cancer, and thyroid cancer. This medicine may be used for other purposes; ask your health care provider or pharmacist if you have questions. COMMON BRAND NAME(S): Adriamycin, Adriamycin PFS, Adriamycin RDF, Rubex What should I tell my health care provider before I take this medicine? They need to know if you have any of these conditions:  heart disease  history of low blood counts caused by a medicine  liver disease  recent or ongoing radiation therapy  an unusual or allergic reaction to doxorubicin, other chemotherapy agents, other medicines, foods, dyes, or preservatives  pregnant or trying to get  pregnant  breast-feeding How should I use this medicine? This drug is given as an infusion into a vein. It is administered in a hospital or clinic by a specially trained health care professional. If you have pain, swelling, burning or any unusual feeling around the site of your injection, tell your health care professional right away. Talk to your pediatrician regarding the use of this medicine in children. Special care may be needed. Overdosage: If you think you have taken too much of this medicine contact a poison control center or emergency room at once. NOTE: This medicine is only for you. Do not share this medicine with others. What if I miss a dose? It is important not to miss your dose. Call your doctor or health care professional if you are unable to keep an appointment. What may interact with this medicine? This medicine may interact with the following medications:  6-mercaptopurine  paclitaxel  phenytoin  St. John's Wort  trastuzumab  verapamil This list may not describe all possible interactions. Give your health care provider a list of all the medicines, herbs, non-prescription drugs, or dietary supplements you use. Also tell them if you smoke, drink alcohol, or use illegal drugs. Some items may interact with your medicine. What should I watch for while using this medicine? This drug may make you feel generally unwell. This is not uncommon, as chemotherapy can affect healthy cells as well as cancer cells. Report any side effects. Continue your course of treatment even though you feel ill unless your doctor tells you to stop. There is a maximum amount of this medicine you should receive throughout your life. The amount  depends on the medical condition being treated and your overall health. Your doctor will watch how much of this medicine you receive in your lifetime. Tell your doctor if you have taken this medicine before. You may need blood work done while you are taking this  medicine. Your urine may turn red for a few days after your dose. This is not blood. If your urine is dark or brown, call your doctor. In some cases, you may be given additional medicines to help with side effects. Follow all directions for their use. Call your doctor or health care professional for advice if you get a fever, chills or sore throat, or other symptoms of a cold or flu. Do not treat yourself. This drug decreases your body's ability to fight infections. Try to avoid being around people who are sick. This medicine may increase your risk to bruise or bleed. Call your doctor or health care professional if you notice any unusual bleeding. Talk to your doctor about your risk of cancer. You may be more at risk for certain types of cancers if you take this medicine. Do not become pregnant while taking this medicine or for 6 months after stopping it. Women should inform their doctor if they wish to become pregnant or think they might be pregnant. Men should not father a child while taking this medicine and for 6 months after stopping it. There is a potential for serious side effects to an unborn child. Talk to your health care professional or pharmacist for more information. Do not breast-feed an infant while taking this medicine. This medicine has caused ovarian failure in some women and reduced sperm counts in some men This medicine may interfere with the ability to have a child. Talk with your doctor or health care professional if you are concerned about your fertility. This medicine may cause a decrease in Co-Enzyme Q-10. You should make sure that you get enough Co-Enzyme Q-10 while you are taking this medicine. Discuss the foods you eat and the vitamins you take with your health care professional. What side effects may I notice from receiving this medicine? Side effects that you should report to your doctor or health care professional as soon as possible:  allergic reactions like skin rash,  itching or hives, swelling of the face, lips, or tongue  breathing problems  chest pain  fast or irregular heartbeat  low blood counts - this medicine may decrease the number of white blood cells, red blood cells and platelets. You may be at increased risk for infections and bleeding.  pain, redness, or irritation at site where injected  signs of infection - fever or chills, cough, sore throat, pain or difficulty passing urine  signs of decreased platelets or bleeding - bruising, pinpoint red spots on the skin, black, tarry stools, blood in the urine  swelling of the ankles, feet, hands  tiredness  weakness Side effects that usually do not require medical attention (report to your doctor or health care professional if they continue or are bothersome):  diarrhea  hair loss  mouth sores  nail discoloration or damage  nausea  red colored urine  vomiting This list may not describe all possible side effects. Call your doctor for medical advice about side effects. You may report side effects to FDA at 1-800-FDA-1088. Where should I keep my medicine? This drug is given in a hospital or clinic and will not be stored at home. NOTE: This sheet is a summary. It may not cover all  possible information. If you have questions about this medicine, talk to your doctor, pharmacist, or health care provider.  2021 Elsevier/Gold Standard (2017-04-07 11:01:26)  Cyclophosphamide Injection What is this medicine? CYCLOPHOSPHAMIDE (sye kloe FOSS fa mide) is a chemotherapy drug. It slows the growth of cancer cells. This medicine is used to treat many types of cancer like lymphoma, myeloma, leukemia, breast cancer, and ovarian cancer, to name a few. This medicine may be used for other purposes; ask your health care provider or pharmacist if you have questions. COMMON BRAND NAME(S): Cytoxan, Neosar What should I tell my health care provider before I take this medicine? They need to know if you  have any of these conditions:  heart disease  history of irregular heartbeat  infection  kidney disease  liver disease  low blood counts, like white cells, platelets, or red blood cells  on hemodialysis  recent or ongoing radiation therapy  scarring or thickening of the lungs  trouble passing urine  an unusual or allergic reaction to cyclophosphamide, other medicines, foods, dyes, or preservatives  pregnant or trying to get pregnant  breast-feeding How should I use this medicine? This drug is usually given as an injection into a vein or muscle or by infusion into a vein. It is administered in a hospital or clinic by a specially trained health care professional. Talk to your pediatrician regarding the use of this medicine in children. Special care may be needed. Overdosage: If you think you have taken too much of this medicine contact a poison control center or emergency room at once. NOTE: This medicine is only for you. Do not share this medicine with others. What if I miss a dose? It is important not to miss your dose. Call your doctor or health care professional if you are unable to keep an appointment. What may interact with this medicine?  amphotericin B  azathioprine  certain antivirals for HIV or hepatitis  certain medicines for blood pressure, heart disease, irregular heart beat  certain medicines that treat or prevent blood clots like warfarin  certain other medicines for cancer  cyclosporine  etanercept  indomethacin  medicines that relax muscles for surgery  medicines to increase blood counts  metronidazole This list may not describe all possible interactions. Give your health care provider a list of all the medicines, herbs, non-prescription drugs, or dietary supplements you use. Also tell them if you smoke, drink alcohol, or use illegal drugs. Some items may interact with your medicine. What should I watch for while using this medicine? Your  condition will be monitored carefully while you are receiving this medicine. You may need blood work done while you are taking this medicine. Drink water or other fluids as directed. Urinate often, even at night. Some products may contain alcohol. Ask your health care professional if this medicine contains alcohol. Be sure to tell all health care professionals you are taking this medicine. Certain medicines, like metronidazole and disulfiram, can cause an unpleasant reaction when taken with alcohol. The reaction includes flushing, headache, nausea, vomiting, sweating, and increased thirst. The reaction can last from 30 minutes to several hours. Do not become pregnant while taking this medicine or for 1 year after stopping it. Women should inform their health care professional if they wish to become pregnant or think they might be pregnant. Men should not father a child while taking this medicine and for 4 months after stopping it. There is potential for serious side effects to an unborn child. Talk to  your health care professional for more information. Do not breast-feed an infant while taking this medicine or for 1 week after stopping it. This medicine has caused ovarian failure in some women. This medicine may make it more difficult to get pregnant. Talk to your health care professional if you are concerned about your fertility. This medicine has caused decreased sperm counts in some men. This may make it more difficult to father a child. Talk to your health care professional if you are concerned about your fertility. Call your health care professional for advice if you get a fever, chills, or sore throat, or other symptoms of a cold or flu. Do not treat yourself. This medicine decreases your body's ability to fight infections. Try to avoid being around people who are sick. Avoid taking medicines that contain aspirin, acetaminophen, ibuprofen, naproxen, or ketoprofen unless instructed by your health care  professional. These medicines may hide a fever. Talk to your health care professional about your risk of cancer. You may be more at risk for certain types of cancer if you take this medicine. If you are going to need surgery or other procedure, tell your health care professional that you are using this medicine. Be careful brushing or flossing your teeth or using a toothpick because you may get an infection or bleed more easily. If you have any dental work done, tell your dentist you are receiving this medicine. What side effects may I notice from receiving this medicine? Side effects that you should report to your doctor or health care professional as soon as possible:  allergic reactions like skin rash, itching or hives, swelling of the face, lips, or tongue  breathing problems  nausea, vomiting  signs and symptoms of bleeding such as bloody or black, tarry stools; red or dark brown urine; spitting up blood or brown material that looks like coffee grounds; red spots on the skin; unusual bruising or bleeding from the eyes, gums, or nose  signs and symptoms of heart failure like fast, irregular heartbeat, sudden weight gain; swelling of the ankles, feet, hands  signs and symptoms of infection like fever; chills; cough; sore throat; pain or trouble passing urine  signs and symptoms of kidney injury like trouble passing urine or change in the amount of urine  signs and symptoms of liver injury like dark yellow or brown urine; general ill feeling or flu-like symptoms; light-colored stools; loss of appetite; nausea; right upper belly pain; unusually weak or tired; yellowing of the eyes or skin Side effects that usually do not require medical attention (report to your doctor or health care professional if they continue or are bothersome):  confusion  decreased hearing  diarrhea  facial flushing  hair loss  headache  loss of appetite  missed menstrual periods  signs and symptoms of  low red blood cells or anemia such as unusually weak or tired; feeling faint or lightheaded; falls  skin discoloration This list may not describe all possible side effects. Call your doctor for medical advice about side effects. You may report side effects to FDA at 1-800-FDA-1088. Where should I keep my medicine? This drug is given in a hospital or clinic and will not be stored at home. NOTE: This sheet is a summary. It may not cover all possible information. If you have questions about this medicine, talk to your doctor, pharmacist, or health care provider.  2021 Elsevier/Gold Standard (2019-05-29 09:53:29)    To help prevent nausea and vomiting after your treatment, we encourage  you to take your nausea medication as directed.  BELOW ARE SYMPTOMS THAT SHOULD BE REPORTED IMMEDIATELY: . *FEVER GREATER THAN 100.4 F (38 C) OR HIGHER . *CHILLS OR SWEATING . *NAUSEA AND VOMITING THAT IS NOT CONTROLLED WITH YOUR NAUSEA MEDICATION . *UNUSUAL SHORTNESS OF BREATH . *UNUSUAL BRUISING OR BLEEDING . *URINARY PROBLEMS (pain or burning when urinating, or frequent urination) . *BOWEL PROBLEMS (unusual diarrhea, constipation, pain near the anus) . TENDERNESS IN MOUTH AND THROAT WITH OR WITHOUT PRESENCE OF ULCERS (sore throat, sores in mouth, or a toothache) . UNUSUAL RASH, SWELLING OR PAIN  . UNUSUAL VAGINAL DISCHARGE OR ITCHING   Items with * indicate a potential emergency and should be followed up as soon as possible or go to the Emergency Department if any problems should occur.  Please show the CHEMOTHERAPY ALERT CARD or IMMUNOTHERAPY ALERT CARD at check-in to the Emergency Department and triage nurse.  Should you have questions after your visit or need to cancel or reschedule your appointment, please contact [facility]  Dept: [phone]  and follow the prompts.  Office hours are 8:00 a.m. to 4:30 p.m. Monday - Friday. Please note that voicemails left after 4:00  p.m. may not be returned until the following business day.  We are closed weekends and major holidays. You have access to a nurse at all times for urgent questions. Please call the main number to the clinic Dept: [phone] and follow the prompts.   For any non-urgent questions, you may also contact your provider using MyChart. We now offer e-Visits for anyone 87 and older to request care online for non-urgent symptoms. For details visit [email or web address].   Also download the MyChart app! Go to the app store, search "MyChart", open the app, select [facility], and log in with your MyChart username and password.  Due to Covid, a mask is required upon entering the hospital/clinic. If you do not have a mask, one will be given to you upon arrival. For doctor visits, patients may have 1 support person aged 46 or older with them. For treatment visits, patients cannot have anyone with them due to current Covid guidelines and our immunocompromised population.

## 2021-01-20 ENCOUNTER — Ambulatory Visit: Payer: Medicaid Other

## 2021-01-22 ENCOUNTER — Other Ambulatory Visit: Payer: Self-pay | Admitting: *Deleted

## 2021-01-22 ENCOUNTER — Telehealth: Payer: Self-pay | Admitting: Hematology and Oncology

## 2021-01-22 DIAGNOSIS — Z170 Estrogen receptor positive status [ER+]: Secondary | ICD-10-CM

## 2021-01-22 DIAGNOSIS — C50512 Malignant neoplasm of lower-outer quadrant of left female breast: Secondary | ICD-10-CM

## 2021-01-22 NOTE — Progress Notes (Signed)
Received call from pt with complaint of nausea and decreased appetitive x 4 days.  Pt states she is taking Zofran and Compazine at the same time and has not alleviated nausea.  RN reviewed medication administration with alternating between Zofran and Compazine.  RN also educated pt on eating a bland diet and increase p.o fluid intake.  Per MD pt needing referral to nutrition for further intervention.  Orders placed and pt educated to call the office if she is unable to tolerate p.o fluids and feels dehydration.

## 2021-01-22 NOTE — Telephone Encounter (Signed)
Scheduled appt per 5/18 sch msg. Called pt, no answer. Left msg with appt date and time.

## 2021-01-31 ENCOUNTER — Other Ambulatory Visit: Payer: Medicaid Other

## 2021-01-31 ENCOUNTER — Telehealth: Payer: Self-pay

## 2021-01-31 ENCOUNTER — Other Ambulatory Visit: Payer: Self-pay

## 2021-01-31 ENCOUNTER — Inpatient Hospital Stay: Payer: Medicaid Other

## 2021-01-31 ENCOUNTER — Encounter: Payer: Self-pay | Admitting: *Deleted

## 2021-01-31 ENCOUNTER — Inpatient Hospital Stay: Payer: Medicaid Other | Admitting: Dietician

## 2021-01-31 ENCOUNTER — Inpatient Hospital Stay (HOSPITAL_BASED_OUTPATIENT_CLINIC_OR_DEPARTMENT_OTHER): Payer: Medicaid Other | Admitting: Hematology and Oncology

## 2021-01-31 DIAGNOSIS — Z5111 Encounter for antineoplastic chemotherapy: Secondary | ICD-10-CM | POA: Diagnosis not present

## 2021-01-31 DIAGNOSIS — C50512 Malignant neoplasm of lower-outer quadrant of left female breast: Secondary | ICD-10-CM

## 2021-01-31 DIAGNOSIS — Z95828 Presence of other vascular implants and grafts: Secondary | ICD-10-CM

## 2021-01-31 DIAGNOSIS — Z170 Estrogen receptor positive status [ER+]: Secondary | ICD-10-CM | POA: Diagnosis not present

## 2021-01-31 LAB — CBC WITH DIFFERENTIAL (CANCER CENTER ONLY)
Abs Immature Granulocytes: 0.01 10*3/uL (ref 0.00–0.07)
Basophils Absolute: 0 10*3/uL (ref 0.0–0.1)
Basophils Relative: 2 %
Eosinophils Absolute: 0.1 10*3/uL (ref 0.0–0.5)
Eosinophils Relative: 2 %
HCT: 34 % — ABNORMAL LOW (ref 36.0–46.0)
Hemoglobin: 11.7 g/dL — ABNORMAL LOW (ref 12.0–15.0)
Immature Granulocytes: 1 %
Lymphocytes Relative: 56 %
Lymphs Abs: 1.2 10*3/uL (ref 0.7–4.0)
MCH: 30.2 pg (ref 26.0–34.0)
MCHC: 34.4 g/dL (ref 30.0–36.0)
MCV: 87.9 fL (ref 80.0–100.0)
Monocytes Absolute: 0.4 10*3/uL (ref 0.1–1.0)
Monocytes Relative: 19 %
Neutro Abs: 0.4 10*3/uL — CL (ref 1.7–7.7)
Neutrophils Relative %: 20 %
Platelet Count: 268 10*3/uL (ref 150–400)
RBC: 3.87 MIL/uL (ref 3.87–5.11)
RDW: 15.6 % — ABNORMAL HIGH (ref 11.5–15.5)
WBC Count: 2.1 10*3/uL — ABNORMAL LOW (ref 4.0–10.5)
nRBC: 0 % (ref 0.0–0.2)

## 2021-01-31 LAB — CMP (CANCER CENTER ONLY)
ALT: 8 U/L (ref 0–44)
AST: 12 U/L — ABNORMAL LOW (ref 15–41)
Albumin: 3.6 g/dL (ref 3.5–5.0)
Alkaline Phosphatase: 78 U/L (ref 38–126)
Anion gap: 11 (ref 5–15)
BUN: 9 mg/dL (ref 6–20)
CO2: 28 mmol/L (ref 22–32)
Calcium: 9.6 mg/dL (ref 8.9–10.3)
Chloride: 102 mmol/L (ref 98–111)
Creatinine: 0.71 mg/dL (ref 0.44–1.00)
GFR, Estimated: >60 mL/min (ref >60)
Glucose, Bld: 107 mg/dL — ABNORMAL HIGH (ref 70–99)
Potassium: 3.6 mmol/L (ref 3.5–5.1)
Sodium: 141 mmol/L (ref 135–145)
Total Bilirubin: <0.2 mg/dL — ABNORMAL LOW (ref 0.3–1.2)
Total Protein: 7 g/dL (ref 6.5–8.1)

## 2021-01-31 MED ORDER — SODIUM CHLORIDE 0.9% FLUSH
10.0000 mL | Freq: Once | INTRAVENOUS | Status: AC
  Administered 2021-01-31: 10 mL
  Filled 2021-01-31: qty 10

## 2021-01-31 NOTE — Telephone Encounter (Signed)
CRITICAL VALUE STICKER  CRITICAL VALUE: ANC 0.4  RECEIVER (on-site recipient of call): Evette Georges, [place] NOTIFIED: 11:53 am  MESSENGER (representative from lab): Ulice Dash  MD NOTIFIED: Dr. Lindi Adie  TIME OF NOTIFICATION: 11:55 am  RESPONSE: Critical lab given to Katheren Puller, RN to give to Dr. Lindi Adie.

## 2021-01-31 NOTE — Progress Notes (Signed)
Nutrition  Patient infusion cancelled today, unable to see patient. Nutrition appointment rescheduled for Friday, June 17. Will plan to see patient in infusion.

## 2021-01-31 NOTE — Telephone Encounter (Signed)
RN placed call to [facility] [phone] to inquire about Methadone dosage and process per MD request. Clinic closes at 11am.    MD inquiring to see if we can take over her Methadone.   RN left voicemail updating patient on status of clinic being closed.

## 2021-01-31 NOTE — Assessment & Plan Note (Signed)
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 ER/PR 95%, Ki-67 5%, HER2 negative 1+ by IHC positive lateral margin  Treatment plan: 1. lateral margin: Benign, AXLND: 25/26 LN Positive 2. Adj chemo with DD AC foll by taxol. 3. Adjuvant radiation therapy 4. Follow-up adjuvant antiestrogen therapy URCC nausea study CT CAP: 11/26/2020: Degenerative changes no metastatic disease Bone scan: 11/26/2020: Emphysema, no evidence of metastatic disease URCCnausea studyand [facility] blood draw study ------------------------------------------------------------------------------------------------------------------------ Current treatment:Dose dense Adriamycin and Cytoxan cycle 3 Echocardiogram3/29/2022: EF 50 to 55% low normal function We will plan another echocardiogram after 3 cycles of Adriamycin  Chemo toxicities: Mild nausea Fatigue Hypokalemia: On potassium supplement  Monitoring closely for toxicities We discontinue narcotics.  She is currently on methadone.  Overall she tolerated chemo extremely well. we reduced the dosage of cycle 2 Return to clinic in 2 weeks for cycle 4

## 2021-01-31 NOTE — Progress Notes (Signed)
Patient Care Team: Patient, [name] as PCP - General (General Practice) [name], RN as Oncology Nurse Navigator [name], RN as Oncology Nurse Navigator  DIAGNOSIS:  Encounter Diagnosis  Name Primary?  . Malignant neoplasm of lower-outer quadrant of left breast of female, estrogen receptor positive ([place])     SUMMARY OF ONCOLOGIC HISTORY: Oncology History  Malignant neoplasm of lower-outer quadrant of left breast of female, estrogen receptor positive ([place])  07/23/2020 Initial Diagnosis   Screening mammogram showed a left breast distortion. Diagnostic mammogram showed a mass at the 4 o'clock position in the left breast, 0.8cm, and 2 adjacent masses in the left breast at the 2 o'clock position, 0.7cm and 0.6cm, and no left axillary lymphadenopathy. Biopsy showed no evidence of malignancy at the 2 o'clock position, and IDC at the 4 o'clock position, grade 1, HER-2 negative (1+), ER/PR+ 95%, Ki67 5%.    09/13/2020 Surgery   Left lumpectomy [name]): invasive lobular carcinoma, 1.5cm, 2 left axillary lymph nodes positive for metastatic carcinoma.   11/08/2020 Surgery   Re-excision and left axillary lymph node dissection [name]): no residual carcinoma in the left breast, 25/26 lymph nodes positive for carcinoma   12/06/2020 -  Chemotherapy    Patient is on Treatment Plan: BREAST ADJUVANT DOSE DENSE AC Q14D / PACLITAXEL Q7D        CHIEF COMPLIANT: Cycle 4 of Adriamycin and Cytoxan (being held because of cytopenias)  INTERVAL HISTORY: Lydia Collins is a 55 year old above-mentioned history of left breast cancer who is currently on adjuvant chemotherapy and today supposedly cycle 4 of chemo.  Unfortunately after cycle 3 she missed her injection appointment and therefore today her [place] is 0.4 and therefore she cannot receive chemotherapy. She is currently on methadone treatment through the recovery program at [facility] and she is asking if we can give her methadone  instead.  We are not sure about the logistics of all of that so we will have to find out more from the recovery program.   ALLERGIES:  is allergic to ibuprofen and naproxen.  MEDICATIONS:  Current Outpatient Medications  Medication Sig Dispense Refill  . cetirizine (ZYRTEC) 10 MG tablet Take 10 mg by mouth daily.    . chlorthalidone (HYGROTON) 25 MG tablet Take 25 mg by mouth every morning.    . clonazePAM (KLONOPIN) 0.5 MG tablet Take 0.5 mg by mouth 2 (two) times daily as needed for anxiety.    Marland Kitchen dexamethasone (DECADRON) 4 MG tablet Take 1 tablet (4 mg total) by mouth daily. Take 1 tablet day after chemo and 1 tablet 2 days after chemo with food 8 tablet 0  . DULoxetine (CYMBALTA) 30 MG capsule Take 30 mg by mouth daily.    Marland Kitchen gabapentin (NEURONTIN) 100 MG capsule Take 3 capsules (300 mg total) by mouth at bedtime. 90 capsule 3  . lidocaine-prilocaine (EMLA) cream Apply to affected area once 30 g 3  . methadone (DOLOPHINE) 10 MG tablet Take 20 mg by mouth every 12 (twelve) hours.    . ondansetron (ZOFRAN) 8 MG tablet TAKE 1 TABLET (8 MG TOTAL) BY MOUTH 2 (TWO) TIMES DAILY AS NEEDED. START ON THE THIRD DAY AFTER CHEMOTHERAPY. 30 tablet 1  . prochlorperazine (COMPAZINE) 10 MG tablet Take 1 tablet (10 mg total) by mouth every 6 (six) hours as needed (Nausea or vomiting). 30 tablet 1  . traMADol (ULTRAM) 50 MG tablet Take 1-2 tablets (50-100 mg total) by mouth every 6 (six) hours as  needed. 10 tablet 0   No current facility-administered medications for this visit.   Facility-Administered Medications Ordered in Other Visits  Medication Dose Route Frequency Provider Last Rate Last Admin  . sodium chloride flush (NS) 0.9 % injection 10 mL  10 mL Intracatheter PRN Nicholas Lose, MD   10 mL at 12/27/20 1640    PHYSICAL EXAMINATION: ECOG PERFORMANCE STATUS: 1 - Symptomatic but completely ambulatory  Vitals:   01/31/21 1143  BP: 126/79  Pulse: 87  Resp: 19  Temp: 97.7 F (36.5 C)  SpO2:  100%   Filed Weights   01/31/21 1143  Weight: 166 lb 6.4 oz (75.5 kg)       LABORATORY DATA:  I have reviewed the data as listed CMP Latest Ref Rng & Units 01/31/2021 01/17/2021 12/27/2020  Glucose 70 - 99 mg/dL 107(H) 113(H) 109(H)  BUN 6 - 20 mg/dL '9 9 13  ' Creatinine 0.44 - 1.00 mg/dL 0.71 0.78 0.70  Sodium 135 - 145 mmol/L 141 138 138  Potassium 3.5 - 5.1 mmol/L 3.6 3.6 3.2(L)  Chloride 98 - 111 mmol/L 102 99 99  CO2 22 - 32 mmol/L '28 30 29  ' Calcium 8.9 - 10.3 mg/dL 9.6 9.4 9.8  Total Protein 6.5 - 8.1 g/dL 7.0 7.1 6.9  Total Bilirubin 0.3 - 1.2 mg/dL <0.2(L) <0.2(L) 0.2(L)  Alkaline Phos 38 - 126 U/L 78 95 89  AST 15 - 41 U/L 12(L) 18 13(L)  ALT 0 - 44 U/L '8 10 13    ' Lab Results  Component Value Date   WBC 2.1 (L) 01/31/2021   HGB 11.7 (L) 01/31/2021   HCT 34.0 (L) 01/31/2021   MCV 87.9 01/31/2021   PLT 268 01/31/2021   NEUTROABS 0.4 (LL) 01/31/2021    ASSESSMENT & PLAN:  Malignant neoplasm of lower-outer quadrant of left breast of female, estrogen receptor positive ([place]) 09/13/2020:Left lumpectomy [name]): Grade 1 invasive lobular carcinoma, 1.5cm, 2/3 left axillary lymph nodes positive for metastatic carcinoma. ER/PR 95%, Ki-67 5%, HER2 negative 1+ by IHC positive lateral margin  Treatment plan: 1. lateral margin: Benign, AXLND: 25/26 LN Positive 2. Adj chemo with DD AC foll by taxol. 3. Adjuvant radiation therapy 4. Follow-up adjuvant antiestrogen therapy URCC nausea study CT CAP: 11/26/2020: Degenerative changes no metastatic disease Bone scan: 11/26/2020: Emphysema, no evidence of metastatic disease URCCnausea studyand [facility] blood draw study ------------------------------------------------------------------------------------------------------------------------ Current treatment:Dose dense Adriamycin and Cytoxan cycle 4 Echocardiogram3/29/2022: EF 50 to 55% low normal function    Chemo toxicities: Mild nausea Fatigue Hypokalemia: On potassium  supplement  Monitoring closely for toxicities We discontinue narcotics.  She is currently on methadone.  She missed her injection appointment after cycle 3 of Adriamycin and Cytoxan.  Today her [place] is 0.4 and therefore we cannot do treatment today.  We will have her come back in 1 week for lab check and see if we can do her fourth cycle of chemo.    No orders of the defined types were placed in this encounter.  The patient has a good understanding of the overall plan. she agrees with it. she will call with any problems that may develop before the next visit here. Total time spent: 30 mins including face to face time and time spent for planning, charting and co-ordination of care   Harriette Ohara, MD 01/31/21

## 2021-01-31 NOTE — Progress Notes (Signed)
CRITICAL VALUE STICKER  CRITICAL VALUE:  ANC 0.4  MD NOTIFIED: Dr. Lindi Adie   RESPONSE: Per MD holding A/C treatment on 5/27

## 2021-02-04 ENCOUNTER — Telehealth: Payer: Self-pay

## 2021-02-04 ENCOUNTER — Inpatient Hospital Stay: Payer: Medicaid Other

## 2021-02-04 NOTE — Telephone Encounter (Signed)
This LPN attempted to call pt to inform her Dr Lindi Adie would be unable to take over care of her Methadone as she is under contract at the Methadone clinic. Pt did not answer. VM box full.

## 2021-02-05 ENCOUNTER — Other Ambulatory Visit: Payer: Self-pay | Admitting: Hematology and Oncology

## 2021-02-05 ENCOUNTER — Telehealth: Payer: Self-pay | Admitting: Hematology and Oncology

## 2021-02-05 ENCOUNTER — Telehealth: Payer: Self-pay | Admitting: *Deleted

## 2021-02-05 MED ORDER — METHADONE HCL 10 MG PO TABS: 20.0000 mg | ORAL_TABLET | Freq: Two times a day (BID) | ORAL | 0 refills | Status: DC

## 2021-02-05 NOTE — Telephone Encounter (Signed)
Scheduled per 5/27 los. Called pt and left a msg

## 2021-02-05 NOTE — Telephone Encounter (Signed)
Received call from pt with complaint of generalized body aches and withdrawal symptoms from Methadone.  Pt states due to transportation issues, she is not able to go to the Methadone clinic every day for her prescription.  MD notified and states our office will fill Methadone prescription every 2 weeks while pt is undergoing active treatment to prevent withdrawal.  Pt will transfer back to the Methadone clinic once treatment is complete.  Pt educated and verbalized understanding.

## 2021-02-07 ENCOUNTER — Inpatient Hospital Stay: Payer: Medicaid Other | Admitting: Hematology and Oncology

## 2021-02-07 ENCOUNTER — Inpatient Hospital Stay: Payer: Medicaid Other

## 2021-02-07 ENCOUNTER — Encounter: Payer: Self-pay | Admitting: *Deleted

## 2021-02-07 NOTE — Assessment & Plan Note (Deleted)
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 ER/PR 95%, Ki-67 5%, HER2 negative 1+ by IHC positive lateral margin  Treatment plan: 1. lateral margin: Benign, AXLND: 25/26 LN Positive 2. Adj chemo with DD AC foll by taxol. 3. Adjuvant radiation therapy 4. Follow-up adjuvant antiestrogen therapy URCC nausea study CT CAP: 11/26/2020: Degenerative changes no metastatic disease Bone scan: 11/26/2020: Emphysema, no evidence of metastatic disease URCCnausea studyand [facility] blood draw study ------------------------------------------------------------------------------------------------------------------------ Current treatment:Dose dense Adriamycin and Cytoxan cycle4 Echocardiogram3/29/2022: EF 50 to 55% low normal function    Chemo toxicities: Mild nausea Fatigue Hypokalemia: On potassium supplement Neutropenia: Because patient missed her Neulasta injection booster appointment.  Monitoring closely for toxicities  Return to clinic in 2 weeks for Taxol

## 2021-02-08 ENCOUNTER — Inpatient Hospital Stay: Payer: Medicaid Other

## 2021-02-10 ENCOUNTER — Telehealth: Payer: Self-pay | Admitting: Hematology and Oncology

## 2021-02-10 NOTE — Progress Notes (Signed)
Patient Care Team: Patient, [name] as PCP - General (General Practice) Pershing Proud, RN as Oncology Nurse Navigator Donnelly Angelica, RN as Oncology Nurse Navigator  DIAGNOSIS:    ICD-10-CM   1. Malignant neoplasm of lower-outer quadrant of left breast of female, estrogen receptor positive ([facility])  C50.512    Z17.0     SUMMARY OF ONCOLOGIC HISTORY: Oncology History  Malignant neoplasm of lower-outer quadrant of left breast of female, estrogen receptor positive ([facility])  07/23/2020 Initial Diagnosis   Screening mammogram showed a left breast distortion. Diagnostic mammogram showed a mass at the 4 o'clock position in the left breast, 0.8cm, and 2 adjacent masses in the left breast at the 2 o'clock position, 0.7cm and 0.6cm, and no left axillary lymphadenopathy. Biopsy showed no evidence of malignancy at the 2 o'clock position, and IDC at the 4 o'clock position, grade 1, HER-2 negative (1+), ER/PR+ 95%, Ki67 5%.    09/13/2020 Surgery   Left lumpectomy Carolynne Edouard): invasive lobular carcinoma, 1.5cm, 2 left axillary lymph nodes positive for metastatic carcinoma.   11/08/2020 Surgery   Re-excision and left axillary lymph node dissection Carolynne Edouard): no residual carcinoma in the left breast, 25/26 lymph nodes positive for carcinoma   12/06/2020 -  Chemotherapy    Patient is on Treatment Plan: BREAST ADJUVANT DOSE DENSE AC Q14D / PACLITAXEL Q7D        CHIEF COMPLIANT: Cycle 4 of Adriamycin and Cytoxan   INTERVAL HISTORY: Lydia Collins is a 55 y.o. with above-mentioned history of left breast cancer who is currently on adjuvant chemotherapy. Cycle 3 was held due to and ANC of 0.4. She presents to the clinic today for cycle 4.  She informed me that she feels fatigued as well as occasional dizziness and lightheadedness.  She does not have any diarrhea but has loose stools intermittently and her potassium today is 2.9.  ALLERGIES:  is allergic to ibuprofen and naproxen.  MEDICATIONS:   Current Outpatient Medications  Medication Sig Dispense Refill  . cetirizine (ZYRTEC) 10 MG tablet Take 10 mg by mouth daily.    . chlorthalidone (HYGROTON) 25 MG tablet Take 25 mg by mouth every morning.    . clonazePAM (KLONOPIN) 0.5 MG tablet Take 0.5 mg by mouth 2 (two) times daily as needed for anxiety.    Marland Kitchen dexamethasone (DECADRON) 4 MG tablet Take 1 tablet (4 mg total) by mouth daily. Take 1 tablet day after chemo and 1 tablet 2 days after chemo with food 8 tablet 0  . DULoxetine (CYMBALTA) 30 MG capsule Take 30 mg by mouth daily.    Marland Kitchen gabapentin (NEURONTIN) 100 MG capsule Take 3 capsules (300 mg total) by mouth at bedtime. 90 capsule 3  . lidocaine-prilocaine (EMLA) cream Apply to affected area once 30 g 3  . methadone (DOLOPHINE) 10 MG tablet Take 2 tablets (20 mg total) by mouth every 12 (twelve) hours. 60 tablet 0  . ondansetron (ZOFRAN) 8 MG tablet TAKE 1 TABLET (8 MG TOTAL) BY MOUTH 2 (TWO) TIMES DAILY AS NEEDED. START ON THE THIRD DAY AFTER CHEMOTHERAPY. 30 tablet 1  . prochlorperazine (COMPAZINE) 10 MG tablet Take 1 tablet (10 mg total) by mouth every 6 (six) hours as needed (Nausea or vomiting). 30 tablet 1  . traMADol (ULTRAM) 50 MG tablet Take 1-2 tablets (50-100 mg total) by mouth every 6 (six) hours as needed. 10 tablet 0   No current facility-administered medications for this visit.   Facility-Administered Medications Ordered in  Other Visits  Medication Dose Route Frequency Provider Last Rate Last Admin  . sodium chloride flush (NS) 0.9 % injection 10 mL  10 mL Intracatheter PRN Nicholas Lose, MD   10 mL at 12/27/20 1640    PHYSICAL EXAMINATION: ECOG PERFORMANCE STATUS: 1 - Symptomatic but completely ambulatory  Vitals:   02/11/21 1222  BP: 130/87  Pulse: (!) 114  Resp: 16  Temp: 97.9 F (36.6 C)  SpO2: 96%   Filed Weights   02/11/21 1222  Weight: 160 lb 1.6 oz (72.6 kg)    LABORATORY DATA:  I have reviewed the data as listed CMP Latest Ref Rng & Units  02/11/2021 01/31/2021 01/17/2021  Glucose 70 - 99 mg/dL 127(H) 107(H) 113(H)  BUN 6 - 20 mg/dL _0 Creatinine 0.44 - 1.00 mg/dL 0.73 0.71 0.78  Sodium 135 - 145 mmol/L 140 141 138  Potassium 3.5 - 5.1 mmol/L 2.9(L) 3.6 3.6  Chloride 98 - 111 mmol/L 100 102 99  CO2 22 - 32 mmol/L _1 Calcium 8.9 - 10.3 mg/dL 9.6 9.6 9.4  Total Protein 6.5 - 8.1 g/dL 7.1 7.0 7.1  Total Bilirubin 0.3 - 1.2 mg/dL 0.2(L) <0.2(L) <0.2(L)  Alkaline Phos 38 - 126 U/L 94 78 95  AST 15 - 41 U/L 20 12(L) 18  ALT 0 - 44 U/L _2 Lab Results  Component Value Date   WBC 11.2 (H) 02/11/2021   HGB 12.8 02/11/2021   HCT 38.1 02/11/2021   MCV 89.2 02/11/2021   PLT 242 02/11/2021   NEUTROABS 8.5 (H) 02/11/2021    ASSESSMENT & PLAN:  Malignant neoplasm of lower-outer quadrant of left breast of female, estrogen receptor positive ([place]) 09/13/2020:Left lumpectomy [name]): Grade 1 invasive lobular carcinoma, 1.5cm, 2/3 left axillary lymph nodes positive for metastatic carcinoma. ER/PR 95%, Ki-67 5%, HER2 negative 1+ by IHC positive lateral margin  Treatment plan: 1. lateral margin: Benign, AXLND: 25/26 LN Positive 2. Adj chemo with DD AC foll by taxol. 3. Adjuvant radiation therapy 4. Follow-up adjuvant antiestrogen therapy URCC nausea study CT CAP: 11/26/2020: Degenerative changes no metastatic disease Bone scan: 11/26/2020: Emphysema, no evidence of metastatic disease URCCnausea studyand [facility] blood draw study ------------------------------------------------------------------------------------------------------------------------ Current treatment:completed 4 cycles of Dose dense Adriamycin and Cytoxan Today is cycle 1 Taxol Echocardiogram3/29/2022: EF 50 to 55% low normal function    Chemo toxicities: Mild nausea Fatigue Hypokalemia: Send potassium replacement therapy 20 mEq daily.  We will give her 20 mEq of potassium chloride in the infusion.  Monitoring closely for toxicities Pain  regimen: She is currently on methadone. We will arrange for Neulasta injection on day 3.   Return to clinic in 2 weeks to start Taxol    No orders of the defined types were placed in this encounter.  The patient has a good understanding of the overall plan. she agrees with it. she will call with any problems that may develop before the next visit here.  Total time spent: 30 mins including face to face time and time spent for planning, charting and coordination of care  Rulon Eisenmenger, MD, MPH 02/11/2021  I, Cloyde Reams Dorshimer, am acting as scribe for Dr. Nicholas Lose.  I have reviewed the above documentation for accuracy and completeness, and I agree with the above.

## 2021-02-10 NOTE — Assessment & Plan Note (Signed)
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 ER/PR 95%, Ki-67 5%, HER2 negative 1+ by IHC positive lateral margin  Treatment plan: 1. lateral margin: Benign, AXLND: 25/26 LN Positive 2. Adj chemo with DD AC foll by taxol. 3. Adjuvant radiation therapy 4. Follow-up adjuvant antiestrogen therapy URCC nausea study CT CAP: 11/26/2020: Degenerative changes no metastatic disease Bone scan: 11/26/2020: Emphysema, no evidence of metastatic disease URCCnausea studyand [facility] blood draw study ------------------------------------------------------------------------------------------------------------------------ Current treatment:completed 4 cycles of Dose dense Adriamycin and Cytoxan Today is cycle 1 Taxol Echocardiogram3/29/2022: EF 50 to 55% low normal function    Chemo toxicities: Mild nausea Fatigue Hypokalemia: On potassium supplement  Monitoring closely for toxicities We discontinue narcotics. She is currently on methadone.  She missed her injection appointment after cycle 3 of Adriamycin and Cytoxan.  Today her [place] is 0.4 and therefore we cannot do treatment today.   We will have her come back in 1 week for lab check and see if we can do her fourth cycle of chemo.

## 2021-02-10 NOTE — Telephone Encounter (Signed)
Scheduled appointment per 06/06 sch msg. Patient is aware. 

## 2021-02-11 ENCOUNTER — Encounter: Payer: Self-pay | Admitting: *Deleted

## 2021-02-11 ENCOUNTER — Inpatient Hospital Stay: Payer: Medicaid Other

## 2021-02-11 ENCOUNTER — Other Ambulatory Visit: Payer: Self-pay

## 2021-02-11 ENCOUNTER — Inpatient Hospital Stay: Payer: Medicaid Other | Attending: Hematology and Oncology

## 2021-02-11 ENCOUNTER — Inpatient Hospital Stay (HOSPITAL_BASED_OUTPATIENT_CLINIC_OR_DEPARTMENT_OTHER): Payer: Medicaid Other | Admitting: Hematology and Oncology

## 2021-02-11 VITALS — HR 100

## 2021-02-11 DIAGNOSIS — Z87891 Personal history of nicotine dependence: Secondary | ICD-10-CM | POA: Diagnosis not present

## 2021-02-11 DIAGNOSIS — I82C11 Acute embolism and thrombosis of right internal jugular vein: Secondary | ICD-10-CM | POA: Diagnosis not present

## 2021-02-11 DIAGNOSIS — C50512 Malignant neoplasm of lower-outer quadrant of left female breast: Secondary | ICD-10-CM | POA: Insufficient documentation

## 2021-02-11 DIAGNOSIS — Z170 Estrogen receptor positive status [ER+]: Secondary | ICD-10-CM

## 2021-02-11 DIAGNOSIS — Z95828 Presence of other vascular implants and grafts: Secondary | ICD-10-CM

## 2021-02-11 DIAGNOSIS — N6321 Unspecified lump in the left breast, upper outer quadrant: Secondary | ICD-10-CM | POA: Insufficient documentation

## 2021-02-11 DIAGNOSIS — Z5189 Encounter for other specified aftercare: Secondary | ICD-10-CM | POA: Insufficient documentation

## 2021-02-11 DIAGNOSIS — F17210 Nicotine dependence, cigarettes, uncomplicated: Secondary | ICD-10-CM | POA: Insufficient documentation

## 2021-02-11 DIAGNOSIS — F419 Anxiety disorder, unspecified: Secondary | ICD-10-CM | POA: Insufficient documentation

## 2021-02-11 DIAGNOSIS — Z886 Allergy status to analgesic agent status: Secondary | ICD-10-CM | POA: Insufficient documentation

## 2021-02-11 DIAGNOSIS — C773 Secondary and unspecified malignant neoplasm of axilla and upper limb lymph nodes: Secondary | ICD-10-CM | POA: Diagnosis not present

## 2021-02-11 DIAGNOSIS — Z5111 Encounter for antineoplastic chemotherapy: Secondary | ICD-10-CM | POA: Insufficient documentation

## 2021-02-11 LAB — CBC WITH DIFFERENTIAL (CANCER CENTER ONLY)
Abs Immature Granulocytes: 0.07 10*3/uL (ref 0.00–0.07)
Basophils Absolute: 0.1 10*3/uL (ref 0.0–0.1)
Basophils Relative: 1 %
Eosinophils Absolute: 0.1 10*3/uL (ref 0.0–0.5)
Eosinophils Relative: 1 %
HCT: 38.1 % (ref 36.0–46.0)
Hemoglobin: 12.8 g/dL (ref 12.0–15.0)
Immature Granulocytes: 1 %
Lymphocytes Relative: 13 %
Lymphs Abs: 1.5 10*3/uL (ref 0.7–4.0)
MCH: 30 pg (ref 26.0–34.0)
MCHC: 33.6 g/dL (ref 30.0–36.0)
MCV: 89.2 fL (ref 80.0–100.0)
Monocytes Absolute: 1 10*3/uL (ref 0.1–1.0)
Monocytes Relative: 9 %
Neutro Abs: 8.5 10*3/uL — ABNORMAL HIGH (ref 1.7–7.7)
Neutrophils Relative %: 75 %
Platelet Count: 242 10*3/uL (ref 150–400)
RBC: 4.27 MIL/uL (ref 3.87–5.11)
RDW: 15.7 % — ABNORMAL HIGH (ref 11.5–15.5)
WBC Count: 11.2 10*3/uL — ABNORMAL HIGH (ref 4.0–10.5)
nRBC: 0 % (ref 0.0–0.2)

## 2021-02-11 LAB — CMP (CANCER CENTER ONLY)
ALT: 19 U/L (ref 0–44)
AST: 20 U/L (ref 15–41)
Albumin: 3.5 g/dL (ref 3.5–5.0)
Alkaline Phosphatase: 94 U/L (ref 38–126)
Anion gap: 12 (ref 5–15)
BUN: 7 mg/dL (ref 6–20)
CO2: 28 mmol/L (ref 22–32)
Calcium: 9.6 mg/dL (ref 8.9–10.3)
Chloride: 100 mmol/L (ref 98–111)
Creatinine: 0.73 mg/dL (ref 0.44–1.00)
GFR, Estimated: >60 mL/min (ref >60)
Glucose, Bld: 127 mg/dL — ABNORMAL HIGH (ref 70–99)
Potassium: 2.9 mmol/L — ABNORMAL LOW (ref 3.5–5.1)
Sodium: 140 mmol/L (ref 135–145)
Total Bilirubin: 0.2 mg/dL — ABNORMAL LOW (ref 0.3–1.2)
Total Protein: 7.1 g/dL (ref 6.5–8.1)

## 2021-02-11 MED ORDER — PALONOSETRON HCL INJECTION 0.25 MG/5ML
0.2500 mg | Freq: Once | INTRAVENOUS | Status: AC
  Administered 2021-02-11: 0.25 mg via INTRAVENOUS

## 2021-02-11 MED ORDER — SODIUM CHLORIDE 0.9 % IV SOLN
150.0000 mg | Freq: Once | INTRAVENOUS | Status: AC
  Administered 2021-02-11: 150 mg via INTRAVENOUS
  Filled 2021-02-11 (×2): qty 5
  Filled 2021-02-11: qty 150

## 2021-02-11 MED ORDER — POTASSIUM CHLORIDE CRYS ER 20 MEQ PO TBCR: 20.0000 meq | EXTENDED_RELEASE_TABLET | Freq: Every day | ORAL | 3 refills | Status: AC

## 2021-02-11 MED ORDER — PALONOSETRON HCL INJECTION 0.25 MG/5ML
INTRAVENOUS | Status: AC
  Filled 2021-02-11: qty 5

## 2021-02-11 MED ORDER — DEXAMETHASONE SODIUM PHOSPHATE 100 MG/10ML IJ SOLN
10.0000 mg | Freq: Once | INTRAMUSCULAR | Status: AC
  Administered 2021-02-11: 10 mg via INTRAVENOUS
  Filled 2021-02-11: qty 1
  Filled 2021-02-11: qty 10
  Filled 2021-02-11: qty 1

## 2021-02-11 MED ORDER — SODIUM CHLORIDE 0.9 % IV SOLN
Freq: Once | INTRAVENOUS | Status: AC
  Filled 2021-02-11: qty 250

## 2021-02-11 MED ORDER — SODIUM CHLORIDE 0.9 % IV SOLN
600.0000 mg/m2 | Freq: Once | INTRAVENOUS | Status: AC
  Administered 2021-02-11: 1120 mg via INTRAVENOUS
  Filled 2021-02-11: qty 56

## 2021-02-11 MED ORDER — HEPARIN SOD (PORK) LOCK FLUSH 100 UNIT/ML IV SOLN
500.0000 [IU] | Freq: Once | INTRAVENOUS | Status: AC | PRN
Start: 2021-02-11 — End: 2021-02-11
  Administered 2021-02-11: 500 [IU]
  Filled 2021-02-11: qty 5

## 2021-02-11 MED ORDER — DOXORUBICIN HCL CHEMO IV INJECTION 2 MG/ML
60.0000 mg/m2 | Freq: Once | INTRAVENOUS | Status: AC
  Administered 2021-02-11: 112 mg via INTRAVENOUS
  Filled 2021-02-11: qty 56

## 2021-02-11 MED ORDER — POTASSIUM CHLORIDE CRYS ER 20 MEQ PO TBCR
EXTENDED_RELEASE_TABLET | ORAL | Status: AC
  Filled 2021-02-11: qty 1

## 2021-02-11 MED ORDER — POTASSIUM CHLORIDE CRYS ER 20 MEQ PO TBCR
20.0000 meq | EXTENDED_RELEASE_TABLET | Freq: Once | ORAL | Status: AC
Start: 2021-02-11 — End: 2021-02-11
  Administered 2021-02-11: 20 meq via ORAL

## 2021-02-11 MED ORDER — PROCHLORPERAZINE MALEATE 10 MG PO TABS: 10.0000 mg | ORAL_TABLET | Freq: Four times a day (QID) | ORAL | 3 refills | Status: AC | PRN

## 2021-02-11 MED ORDER — SODIUM CHLORIDE 0.9% FLUSH
10.0000 mL | INTRAVENOUS | Status: DC | PRN
  Administered 2021-02-11: 10 mL
  Filled 2021-02-11: qty 10

## 2021-02-11 MED ORDER — SODIUM CHLORIDE 0.9% FLUSH
10.0000 mL | Freq: Once | INTRAVENOUS | Status: AC
  Administered 2021-02-11: 10 mL
  Filled 2021-02-11: qty 10

## 2021-02-11 NOTE — Progress Notes (Signed)
Per MD okay to tx with HR 100

## 2021-02-11 NOTE — Patient Instructions (Signed)
[facility]  Discharge Instructions: Thank you for choosing [facility] to provide your oncology and hematology care.   If you have a lab appointment with the [facility], please go directly to the [facility] and check in at the registration area.   Wear comfortable clothing and clothing appropriate for easy access to any Portacath or PICC line.   We strive to give you quality time with your provider. You may need to reschedule your appointment if you arrive late (15 or more minutes).  Arriving late affects you and other patients whose appointments are after yours.  Also, if you miss three or more appointments without notifying the office, you may be dismissed from the clinic at the provider's discretion.      For prescription refill requests, have your pharmacy contact our office and allow 72 hours for refills to be completed.    Today you received the following chemotherapy and/or immunotherapy agents doxorubicin/cytoxan      To help prevent nausea and vomiting after your treatment, we encourage you to take your nausea medication as directed.  BELOW ARE SYMPTOMS THAT SHOULD BE REPORTED IMMEDIATELY: . *FEVER GREATER THAN 100.4 F (38 C) OR HIGHER . *CHILLS OR SWEATING . *NAUSEA AND VOMITING THAT IS NOT CONTROLLED WITH YOUR NAUSEA MEDICATION . *UNUSUAL SHORTNESS OF BREATH . *UNUSUAL BRUISING OR BLEEDING . *URINARY PROBLEMS (pain or burning when urinating, or frequent urination) . *BOWEL PROBLEMS (unusual diarrhea, constipation, pain near the anus) . TENDERNESS IN MOUTH AND THROAT WITH OR WITHOUT PRESENCE OF ULCERS (sore throat, sores in mouth, or a toothache) . UNUSUAL RASH, SWELLING OR PAIN  . UNUSUAL VAGINAL DISCHARGE OR ITCHING   Items with * indicate a potential emergency and should be followed up as soon as possible or go to the Emergency Department if any problems should occur.  Please show the CHEMOTHERAPY ALERT CARD or  IMMUNOTHERAPY ALERT CARD at check-in to the Emergency Department and triage nurse.  Should you have questions after your visit or need to cancel or reschedule your appointment, please contact [facility]  Dept: [phone]  and follow the prompts.  Office hours are 8:00 a.m. to 4:30 p.m. Monday - Friday. Please note that voicemails left after 4:00 p.m. may not be returned until the following business day.  We are closed weekends and major holidays. You have access to a nurse at all times for urgent questions. Please call the main number to the clinic Dept: [phone] and follow the prompts.   For any non-urgent questions, you may also contact your provider using MyChart. We now offer e-Visits for anyone 23 and older to request care online for non-urgent symptoms. For details visit [email or web address].   Also download the MyChart app! Go to the app store, search "MyChart", open the app, select [facility], and log in with your MyChart username and password.  Due to Covid, a mask is required upon entering the hospital/clinic. If you do not have a mask, one will be given to you upon arrival. For doctor visits, patients may have 1 support person aged 23 or older with them. For treatment visits, patients cannot have anyone with them due to current Covid guidelines and our immunocompromised population.

## 2021-02-13 ENCOUNTER — Inpatient Hospital Stay: Payer: Medicaid Other

## 2021-02-13 ENCOUNTER — Other Ambulatory Visit: Payer: Self-pay

## 2021-02-13 VITALS — BP 148/95 | HR 82 | Temp 97.9°F | Resp 18

## 2021-02-13 DIAGNOSIS — Z5111 Encounter for antineoplastic chemotherapy: Secondary | ICD-10-CM | POA: Diagnosis not present

## 2021-02-13 DIAGNOSIS — C50512 Malignant neoplasm of lower-outer quadrant of left female breast: Secondary | ICD-10-CM

## 2021-02-13 MED ORDER — PEGFILGRASTIM-JMDB 6 MG/0.6ML ~~LOC~~ SOSY
PREFILLED_SYRINGE | SUBCUTANEOUS | Status: AC
  Filled 2021-02-13: qty 0.6

## 2021-02-13 MED ORDER — PEGFILGRASTIM-JMDB 6 MG/0.6ML ~~LOC~~ SOSY
6.0000 mg | PREFILLED_SYRINGE | Freq: Once | SUBCUTANEOUS | Status: AC
Start: 2021-02-13 — End: 2021-02-13
  Administered 2021-02-13: 6 mg via SUBCUTANEOUS

## 2021-02-14 ENCOUNTER — Ambulatory Visit: Payer: Medicaid Other

## 2021-02-17 ENCOUNTER — Telehealth: Payer: Self-pay | Admitting: *Deleted

## 2021-02-17 ENCOUNTER — Encounter: Payer: Self-pay | Admitting: *Deleted

## 2021-02-17 NOTE — Telephone Encounter (Signed)
Received after hours message from pt with complaint of dizziness.  RN attempt x1 to return call to pt.  No answer, LVM to return call to the office.

## 2021-02-21 ENCOUNTER — Ambulatory Visit: Payer: Medicaid Other

## 2021-02-21 ENCOUNTER — Other Ambulatory Visit: Payer: Medicaid Other

## 2021-02-21 ENCOUNTER — Ambulatory Visit: Payer: Medicaid Other | Admitting: Hematology and Oncology

## 2021-02-21 ENCOUNTER — Encounter: Payer: Medicaid Other | Admitting: Dietician

## 2021-02-22 ENCOUNTER — Emergency Department (HOSPITAL_COMMUNITY): Payer: Medicaid Other

## 2021-02-22 ENCOUNTER — Other Ambulatory Visit: Payer: Self-pay

## 2021-02-22 ENCOUNTER — Encounter (HOSPITAL_COMMUNITY): Payer: Self-pay | Admitting: *Deleted

## 2021-02-22 ENCOUNTER — Inpatient Hospital Stay (HOSPITAL_COMMUNITY)
Admission: EM | Admit: 2021-02-22 | Discharge: 2021-02-24 | DRG: 315 | Disposition: A | Discharge status: Home / Self Care | Payer: Medicaid Other | Attending: Internal Medicine | Admitting: Internal Medicine

## 2021-02-22 DIAGNOSIS — E876 Hypokalemia: Secondary | ICD-10-CM | POA: Diagnosis not present

## 2021-02-22 DIAGNOSIS — Z170 Estrogen receptor positive status [ER+]: Secondary | ICD-10-CM

## 2021-02-22 DIAGNOSIS — D6869 Other thrombophilia: Secondary | ICD-10-CM | POA: Diagnosis present

## 2021-02-22 DIAGNOSIS — Z95828 Presence of other vascular implants and grafts: Secondary | ICD-10-CM

## 2021-02-22 DIAGNOSIS — I82C11 Acute embolism and thrombosis of right internal jugular vein: Secondary | ICD-10-CM | POA: Diagnosis not present

## 2021-02-22 DIAGNOSIS — T82868A Thrombosis of vascular prosthetic devices, implants and grafts, initial encounter: Principal | ICD-10-CM | POA: Diagnosis present

## 2021-02-22 DIAGNOSIS — C50512 Malignant neoplasm of lower-outer quadrant of left female breast: Secondary | ICD-10-CM | POA: Diagnosis not present

## 2021-02-22 DIAGNOSIS — Y828 Other medical devices associated with adverse incidents: Secondary | ICD-10-CM | POA: Diagnosis present

## 2021-02-22 DIAGNOSIS — G8929 Other chronic pain: Secondary | ICD-10-CM | POA: Diagnosis present

## 2021-02-22 DIAGNOSIS — Z886 Allergy status to analgesic agent status: Secondary | ICD-10-CM

## 2021-02-22 DIAGNOSIS — F17210 Nicotine dependence, cigarettes, uncomplicated: Secondary | ICD-10-CM | POA: Diagnosis present

## 2021-02-22 DIAGNOSIS — I82409 Acute embolism and thrombosis of unspecified deep veins of unspecified lower extremity: Secondary | ICD-10-CM | POA: Diagnosis present

## 2021-02-22 DIAGNOSIS — Z20822 Contact with and (suspected) exposure to covid-19: Secondary | ICD-10-CM | POA: Diagnosis present

## 2021-02-22 DIAGNOSIS — F419 Anxiety disorder, unspecified: Secondary | ICD-10-CM | POA: Diagnosis present

## 2021-02-22 DIAGNOSIS — I8290 Acute embolism and thrombosis of unspecified vein: Secondary | ICD-10-CM

## 2021-02-22 DIAGNOSIS — Y838 Other surgical procedures as the cause of abnormal reaction of the patient, or of later complication, without mention of misadventure at the time of the procedure: Secondary | ICD-10-CM | POA: Diagnosis present

## 2021-02-22 DIAGNOSIS — Z79899 Other long term (current) drug therapy: Secondary | ICD-10-CM

## 2021-02-22 DIAGNOSIS — D696 Thrombocytopenia, unspecified: Secondary | ICD-10-CM | POA: Diagnosis present

## 2021-02-22 DIAGNOSIS — I10 Essential (primary) hypertension: Secondary | ICD-10-CM | POA: Diagnosis present

## 2021-02-22 LAB — CBC WITH DIFFERENTIAL/PLATELET
Abs Immature Granulocytes: 0.36 10*3/uL — ABNORMAL HIGH (ref 0.00–0.07)
Basophils Absolute: 0.1 10*3/uL (ref 0.0–0.1)
Basophils Relative: 1 %
Eosinophils Absolute: 0.1 10*3/uL (ref 0.0–0.5)
Eosinophils Relative: 1 %
HCT: 33.5 % — ABNORMAL LOW (ref 36.0–46.0)
Hemoglobin: 11.2 g/dL — ABNORMAL LOW (ref 12.0–15.0)
Immature Granulocytes: 4 %
Lymphocytes Relative: 14 %
Lymphs Abs: 1.2 10*3/uL (ref 0.7–4.0)
MCH: 29.9 pg (ref 26.0–34.0)
MCHC: 33.4 g/dL (ref 30.0–36.0)
MCV: 89.3 fL (ref 80.0–100.0)
Monocytes Absolute: 0.9 10*3/uL (ref 0.1–1.0)
Monocytes Relative: 10 %
Neutro Abs: 5.8 10*3/uL (ref 1.7–7.7)
Neutrophils Relative %: 70 %
Platelets: 127 10*3/uL — ABNORMAL LOW (ref 150–400)
RBC: 3.75 MIL/uL — ABNORMAL LOW (ref 3.87–5.11)
RDW: 15.4 % (ref 11.5–15.5)
WBC: 8.4 10*3/uL (ref 4.0–10.5)
nRBC: 0.2 % (ref 0.0–0.2)

## 2021-02-22 LAB — COMPREHENSIVE METABOLIC PANEL
ALT: 12 U/L (ref 0–44)
AST: 14 U/L — ABNORMAL LOW (ref 15–41)
Albumin: 3.8 g/dL (ref 3.5–5.0)
Alkaline Phosphatase: 98 U/L (ref 38–126)
Anion gap: 12 (ref 5–15)
BUN: 13 mg/dL (ref 6–20)
CO2: 25 mmol/L (ref 22–32)
Calcium: 9.1 mg/dL (ref 8.9–10.3)
Chloride: 99 mmol/L (ref 98–111)
Creatinine, Ser: 0.82 mg/dL (ref 0.44–1.00)
GFR, Estimated: >60 mL/min (ref >60)
Glucose, Bld: 108 mg/dL — ABNORMAL HIGH (ref 70–99)
Potassium: 3 mmol/L — ABNORMAL LOW (ref 3.5–5.1)
Sodium: 136 mmol/L (ref 135–145)
Total Bilirubin: 0.2 mg/dL — ABNORMAL LOW (ref 0.3–1.2)
Total Protein: 7.3 g/dL (ref 6.5–8.1)

## 2021-02-22 LAB — LACTIC ACID, PLASMA
Lactic Acid, Venous: 1 mmol/L (ref 0.5–1.9)
Lactic Acid, Venous: 1.4 mmol/L (ref 0.5–1.9)

## 2021-02-22 MED ORDER — OXYCODONE HCL 5 MG PO TABS
5.0000 mg | ORAL_TABLET | Freq: Four times a day (QID) | ORAL | Status: DC | PRN
  Administered 2021-02-23 (×2): 5 mg via ORAL
  Filled 2021-02-22 (×2): qty 1

## 2021-02-22 MED ORDER — ONDANSETRON HCL 4 MG PO TABS: 4.0000 mg | ORAL_TABLET | Freq: Four times a day (QID) | ORAL | Status: DC | PRN

## 2021-02-22 MED ORDER — PROCHLORPERAZINE MALEATE 10 MG PO TABS: 10.0000 mg | ORAL_TABLET | Freq: Four times a day (QID) | ORAL | Status: DC | PRN

## 2021-02-22 MED ORDER — HEPARIN BOLUS VIA INFUSION
2000.0000 [IU] | Freq: Once | INTRAVENOUS | Status: AC
  Administered 2021-02-22: 2000 [IU] via INTRAVENOUS
  Filled 2021-02-22: qty 2000

## 2021-02-22 MED ORDER — ONDANSETRON HCL 4 MG/2ML IJ SOLN: 4.0000 mg | Freq: Four times a day (QID) | INTRAMUSCULAR | Status: DC | PRN

## 2021-02-22 MED ORDER — POTASSIUM CHLORIDE CRYS ER 20 MEQ PO TBCR: 40.0000 meq | EXTENDED_RELEASE_TABLET | Freq: Once | ORAL | Status: DC

## 2021-02-22 MED ORDER — DULOXETINE HCL 30 MG PO CPEP
30.0000 mg | ORAL_CAPSULE | Freq: Every day | ORAL | Status: DC
  Administered 2021-02-23 – 2021-02-24 (×2): 30 mg via ORAL
  Filled 2021-02-22 (×2): qty 1

## 2021-02-22 MED ORDER — FENTANYL CITRATE (PF) 100 MCG/2ML IJ SOLN
100.0000 ug | INTRAMUSCULAR | Status: AC | PRN
  Administered 2021-02-22 – 2021-02-23 (×3): 100 ug via INTRAVENOUS
  Filled 2021-02-22 (×4): qty 2

## 2021-02-22 MED ORDER — CHLORTHALIDONE 25 MG PO TABS
25.0000 mg | ORAL_TABLET | Freq: Every morning | ORAL | Status: DC
  Administered 2021-02-23 – 2021-02-24 (×2): 25 mg via ORAL
  Filled 2021-02-22 (×2): qty 1

## 2021-02-22 MED ORDER — POTASSIUM CHLORIDE CRYS ER 20 MEQ PO TBCR
40.0000 meq | EXTENDED_RELEASE_TABLET | Freq: Once | ORAL | Status: AC
  Administered 2021-02-22: 40 meq via ORAL
  Filled 2021-02-22: qty 2

## 2021-02-22 MED ORDER — IOHEXOL 300 MG/ML  SOLN
75.0000 mL | Freq: Once | INTRAMUSCULAR | Status: AC | PRN
  Administered 2021-02-22: 75 mL via INTRAVENOUS

## 2021-02-22 MED ORDER — POTASSIUM CHLORIDE CRYS ER 20 MEQ PO TBCR
20.0000 meq | EXTENDED_RELEASE_TABLET | Freq: Every morning | ORAL | Status: DC
  Administered 2021-02-23: 20 meq via ORAL
  Filled 2021-02-22: qty 1

## 2021-02-22 MED ORDER — ACETAMINOPHEN 650 MG RE SUPP: 650.0000 mg | Freq: Four times a day (QID) | RECTAL | Status: DC | PRN

## 2021-02-22 MED ORDER — LORAZEPAM 2 MG/ML IJ SOLN
1.0000 mg | Freq: Once | INTRAMUSCULAR | Status: AC
  Administered 2021-02-22: 1 mg via INTRAVENOUS
  Filled 2021-02-22: qty 1

## 2021-02-22 MED ORDER — SODIUM CHLORIDE (PF) 0.9 % IJ SOLN
INTRAMUSCULAR | Status: AC
  Filled 2021-02-22: qty 50

## 2021-02-22 MED ORDER — ACETAMINOPHEN 325 MG PO TABS: 650.0000 mg | ORAL_TABLET | Freq: Four times a day (QID) | ORAL | Status: DC | PRN

## 2021-02-22 MED ORDER — CLONAZEPAM 0.5 MG PO TABS
0.5000 mg | ORAL_TABLET | Freq: Every evening | ORAL | Status: DC | PRN
  Administered 2021-02-23: 0.5 mg via ORAL
  Filled 2021-02-22: qty 1

## 2021-02-22 MED ORDER — HEPARIN (PORCINE) 25000 UT/250ML-% IV SOLN
1550.0000 [IU]/h | INTRAVENOUS | Status: AC
  Administered 2021-02-22: 1150 [IU]/h via INTRAVENOUS
  Administered 2021-02-23 – 2021-02-24 (×2): 1550 [IU]/h via INTRAVENOUS
  Filled 2021-02-22 (×4): qty 250

## 2021-02-22 MED ORDER — ALPRAZOLAM 0.5 MG PO TABS: 0.5000 mg | ORAL_TABLET | Freq: Every evening | ORAL | Status: DC | PRN

## 2021-02-22 NOTE — ED Provider Notes (Signed)
[facility] DEPT Provider Note   CSN: [id number] Arrival date & time: 02/22/21  1204     History Chief Complaint  Patient presents with   Neck Pain    Lydia Collins is a 55 y.o. female.  HPI She presents for evaluation of pain and swelling in her neck associated with headache, that started about a week ago.  She denies trauma.  She is currently receiving chemotherapy, for cancer.  Last infusion was about 8 days ago.  She denies fever, blurred vision, nausea, vomiting, weakness or dizziness.  She is not having shortness of breath, chest pain, or swelling in her upper extremities.  There are no other known active modifying factors.    Past Medical History:  Diagnosis Date   Anemia    Anxiety    Cancer ([place]) 08/2020   [place] left breast   Chronic narcotic use    Chronic pain    Dyspnea    with activity   Hypertension    Smoker     Patient Active Problem List   Diagnosis Date Noted   Port-A-Cath in place 12/13/2020   Malignant neoplasm of lower-outer quadrant of left breast of female, estrogen receptor positive ([place]) 07/23/2020    Past Surgical History:  Procedure Laterality Date   ABDOMINAL HYSTERECTOMY     partial hysterectomy    AXILLARY LYMPH NODE DISSECTION Left 11/08/2020   Procedure: LEFT AXILLARY LYMPH NODE DISSECTION;  Surgeon: Jovita Kussmaul, MD;  Location: [facility];  Service: General;  Laterality: Left;   BREAST LUMPECTOMY WITH RADIOACTIVE SEED AND SENTINEL LYMPH NODE BIOPSY Left 09/13/2020   Procedure: LEFT BREAST LUMPECTOMY WITH RADIOACTIVE SEED AND SENTINEL LYMPH NODE BIOPSY;  Surgeon: Jovita Kussmaul, MD;  Location: [facility];  Service: General;  Laterality: Left;   PORTACATH PLACEMENT N/A 12/05/2020   Procedure: INSERTION PORT-A-CATH;  Surgeon: Jovita Kussmaul, MD;  Location: [facility];  Service: General;  Laterality: N/A;   RE-EXCISION OF BREAST LUMPECTOMY Left 11/08/2020   Procedure:  RE-EXCISION OF LEFT BREAST MARGIN;  Surgeon: Jovita Kussmaul, MD;  Location: [facility];  Service: General;  Laterality: Left;     OB History   No obstetric history on file.     Family History  Problem Relation Age of Onset   Breast cancer Neg Hx     Social History   Tobacco Use   Smoking status: Every Day    Packs/day: 0.50    Pack years: 0.00    Types: Cigarettes   Smokeless tobacco: Never  Vaping Use   Vaping Use: Never used  Substance Use Topics   Alcohol use: No   Drug use: No    Home Medications Prior to Admission medications   Medication Sig Start Date End Date Taking? Authorizing Provider  cetirizine (ZYRTEC) 10 MG tablet Take 10 mg by mouth daily.    [provider]  chlorthalidone (HYGROTON) 25 MG tablet Take 25 mg by mouth every morning. 05/06/20   [provider]  clonazePAM (KLONOPIN) 0.5 MG tablet Take 0.5 mg by mouth 2 (two) times daily as needed for anxiety.    [provider]  dexamethasone (DECADRON) 4 MG tablet Take 1 tablet (4 mg total) by mouth daily. Take 1 tablet day after chemo and 1 tablet 2 days after chemo with food 11/27/20   Nicholas Lose, MD  DULoxetine (CYMBALTA) 30 MG capsule Take 30 mg by mouth daily.  [provider]  gabapentin (NEURONTIN) 100 MG capsule Take 3 capsules (300 mg total) by mouth at bedtime. 10/07/20   Nicholas Lose, MD  lidocaine-prilocaine (EMLA) cream Apply to affected area once 11/27/20   Nicholas Lose, MD  methadone (DOLOPHINE) 10 MG tablet Take 2 tablets (20 mg total) by mouth every 12 (twelve) hours. 02/05/21   Nicholas Lose, MD  ondansetron (ZOFRAN) 8 MG tablet TAKE 1 TABLET (8 MG TOTAL) BY MOUTH 2 (TWO) TIMES DAILY AS NEEDED. START ON THE THIRD DAY AFTER CHEMOTHERAPY. 01/02/21   Nicholas Lose, MD  potassium chloride SA (KLOR-CON) 20 MEQ tablet Take 1 tablet (20 mEq total) by mouth daily. 02/11/21   Nicholas Lose, MD  prochlorperazine (COMPAZINE) 10 MG tablet Take 1 tablet (10  mg total) by mouth every 6 (six) hours as needed (Nausea or vomiting). 02/11/21   Nicholas Lose, MD  traMADol (ULTRAM) 50 MG tablet Take 1-2 tablets (50-100 mg total) by mouth every 6 (six) hours as needed. 12/05/20   Autumn Messing III, MD    Allergies    Ibuprofen and Naproxen  Review of Systems   Review of Systems  All other systems reviewed and are negative.  Physical Exam Updated Vital Signs BP 101/70   Pulse (!) 111   Temp 98.8 F (37.1 C) (Oral)   Resp 18   LMP 04/05/2012   SpO2 98%   Physical Exam Vitals and nursing note reviewed.  Constitutional:      General: She is not in acute distress.    Appearance: She is well-developed. She is not ill-appearing.  HENT:     Head: Normocephalic and atraumatic.     Right Ear: External ear normal.     Left Ear: External ear normal.  Eyes:     Conjunctiva/sclera: Conjunctivae normal.     Pupils: Pupils are equal, round, and reactive to light.  Neck:     Trachea: Phonation normal.     Comments: Tender with leg swelling right lateral neck.  No distinct mass.  She guards against movement of the neck, toward the right either rotation or bending.  No overlying skin changes. Cardiovascular:     Rate and Rhythm: Normal rate.  Pulmonary:     Effort: Pulmonary effort is normal. No respiratory distress.     Breath sounds: No stridor.  Abdominal:     General: There is no distension.     Palpations: Abdomen is soft.  Musculoskeletal:        General: Normal range of motion.  Skin:    General: Skin is warm and dry.  Neurological:     Mental Status: She is alert and oriented to person, place, and time.     Cranial Nerves: No cranial nerve deficit.     Sensory: No sensory deficit.     Motor: No abnormal muscle tone.     Coordination: Coordination normal.  Psychiatric:        Mood and Affect: Mood normal.        Behavior: Behavior normal.        Thought Content: Thought content normal.        Judgment: Judgment normal.    ED Results /  Procedures / Treatments   Labs (all labs ordered are listed, but only abnormal results are displayed) Labs Reviewed  CBC WITH DIFFERENTIAL/PLATELET - Abnormal; Notable for the following components:      Result Value   RBC 3.75 (*)    Hemoglobin 11.2 (*)    HCT 33.5 (*)  Platelets 127 (*)    Abs Immature Granulocytes 0.36 (*)    All other components within normal limits  COMPREHENSIVE METABOLIC PANEL - Abnormal; Notable for the following components:   Potassium 3.0 (*)    Glucose, Bld 108 (*)    AST 14 (*)    Total Bilirubin 0.2 (*)    All other components within normal limits  CULTURE, BLOOD (SINGLE)  SARS CORONAVIRUS 2 (TAT 6-24 HRS)  LACTIC ACID, PLASMA  LACTIC ACID, PLASMA  HEPARIN LEVEL (UNFRACTIONATED)  CBC    EKG None  Radiology CT Head Wo Contrast  Result Date: 02/22/2021 CLINICAL DATA:  Headache. Patient reports right-sided neck pain and swelling. Breast cancer with active chemotherapy EXAM: CT HEAD WITHOUT CONTRAST TECHNIQUE: Contiguous axial images were obtained from the base of the skull through the vertex without intravenous contrast. COMPARISON:  Head CT 10/23/2015 FINDINGS: Brain: No intracranial hemorrhage, mass effect, or midline shift. No hydrocephalus. The basilar cisterns are patent. No evidence of territorial infarct or acute ischemia. No extra-axial or intracranial fluid collection. Vascular: No hyperdense vessel. Skull: Normal. Negative for fracture or focal lesion. Sinuses/Orbits: Paranasal sinuses and mastoid air cells are clear. The visualized orbits are unremarkable. Other: None IMPRESSION: Negative noncontrast head CT. Electronically Signed   By: Keith Rake M.D.   On: 02/22/2021 17:31   CT Soft Tissue Neck W Contrast  Result Date: 02/22/2021 CLINICAL DATA:  Soft tissue mass, neck, spontaneous hemorrhage. Right neck pain and swelling into the right chest. Patient is concerned about infection of her Port-A-Cath. EXAM: CT NECK WITH CONTRAST  TECHNIQUE: Multidetector CT imaging of the neck was performed using the standard protocol following the bolus administration of intravenous contrast. CONTRAST:  57mL OMNIPAQUE IOHEXOL 300 MG/ML  SOLN COMPARISON:  None. FINDINGS: Pharynx and larynx: No focal mucosal lesions are present. Salivary glands: Submandibular and parotid glands and ducts are within limits. Thyroid: Normal. Lymph nodes: Normal sized reactive type lymph nodes are present bilaterally. Vascular: The right internal jugular vein is thrombosed from the angle the mandible to just above where the innominate vein enters. Thrombus surrounds the tunneled catheter. There is enhancement about the vein with some surrounding inflammatory change. Minimal atherosclerotic changes are present on both carotid bifurcations. Atherosclerotic changes are present at the aortic arch. Limited intracranial: Within normal limits Visualized orbits: The globes and orbits are within normal limits. Mastoids and visualized paranasal sinuses: The paranasal sinuses and mastoid air cells are clear. Skeleton: Degenerative changes the cervical spine most pronounced at C5-6 with uncovertebral facet hypertrophy contributing to foraminal narrowing bilaterally. No focal lytic or blastic lesions are present. Upper chest: Lung apices are clear. Thoracic inlet is within normal limits. IMPRESSION: 1. 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 2. Thrombus surrounds the tunneled catheter. 3. No other acute or focal lesion to explain the patient's symptoms. 4. Degenerative changes of the cervical spine are most pronounced at C5-6 with uncovertebral facet hypertrophy contributing to foraminal narrowing bilaterally. 5. Aortic Atherosclerosis (ICD10-I70.0). Critical Value/emergent results were called by telephone at the time of interpretation on 02/22/2021 at 5:55 pm to provider [facility] , who verbally  acknowledged these results. Electronically Signed   By: San Morelle M.D.   On: 02/22/2021 17:55    Procedures .Critical Care  Date/Time: 02/22/2021 7:07 PM Performed by: Daleen Bo, MD Authorized by: Daleen Bo, MD   Critical care provider statement:    Critical care time (minutes):  50   Critical care start time:  02/22/2021 3:05 PM   Critical care end time:  02/22/2021 7:08 PM   Critical care time was exclusive of:  Separately billable procedures and treating other patients   Critical care was necessary to treat or prevent imminent or life-threatening deterioration of the following conditions:  Circulatory failure   Critical care was time spent personally by me on the following activities:  Blood draw for specimens, development of treatment plan with patient or surrogate, discussions with consultants, evaluation of patient's response to treatment, examination of patient, obtaining history from patient or surrogate, ordering and performing treatments and interventions, ordering and review of laboratory studies, pulse oximetry, re-evaluation of patient's condition, review of old charts and ordering and review of radiographic studies   Medications Ordered in ED Medications  sodium chloride (PF) 0.9 % injection (  Not Given 02/22/21 1811)  fentaNYL (SUBLIMAZE) injection 100 mcg (100 mcg Intravenous Given 02/22/21 1726)  heparin ADULT infusion 100 units/mL (25000 units/253mL) (1,150 Units/hr Intravenous New Bag/Given 02/22/21 1859)  LORazepam (ATIVAN) injection 1 mg (has no administration in time range)  potassium chloride SA (KLOR-CON) CR tablet 40 mEq (has no administration in time range)  iohexol (OMNIPAQUE) 300 MG/ML solution 75 mL (75 mLs Intravenous Contrast Given 02/22/21 1707)  heparin bolus via infusion 2,000 Units (2,000 Units Intravenous Bolus from Bag 02/22/21 1901)    ED Course  I have reviewed the triage vital signs and the nursing notes.  Pertinent labs & imaging  results that were available during my care of the patient were reviewed by me and considered in my medical decision making (see chart for details).    MDM Rules/Calculators/A&P                           Patient Vitals for the past 24 hrs:  BP Temp Temp src Pulse Resp SpO2  02/22/21 1845 101/70 -- -- (!) 111 18 98 %  02/22/21 1802 109/65 98.8 F (37.1 C) Oral (!) 104 17 99 %  02/22/21 1700 106/74 -- -- 100 17 98 %  02/22/21 1630 109/68 -- -- (!) 106 17 97 %  02/22/21 1600 110/71 -- -- 93 -- 96 %  02/22/21 1530 117/79 -- -- 99 16 97 %  02/22/21 1523 109/74 -- -- 99 16 99 %  02/22/21 1427 112/71 -- -- (!) 103 16 99 %  02/22/21 1210 (!) 129/93 -- -- (!) 115 -- 98 %  02/22/21 1209 (!) 129/93 99.2 F (37.3 C) Oral (!) 112 18 99 %    7:07 PM Reevaluation with update and discussion. After initial assessment and treatment, an updated evaluation reveals she remains fairly comfortable, findings discussed with the patient and all questions were answered. Daleen Bo   Medical Decision Making:  This patient is presenting for evaluation of pain and swelling of neck, which does require a range of treatment options, and is a complaint that involves a high risk of morbidity and mortality. The differential diagnoses include soft tissue mass, vascular occlusion from clot, intrathoracic disorder. I decided to review old records, and in summary patient with history of breast cancer, has chronic pain, and presenting for evaluation of atraumatic neck pain.  She is actively receiving chemotherapy so is at increased risk for blood clotting disorders. I did not require additional historical information from anyone.  Clinical Laboratory Tests Ordered, included CBC, Metabolic panel, and lactate, viral panel  . Review indicates normal except  potassium low, hemoglobin low. Radiologic Tests Ordered, included CT neck with IV contrast, CT head.  I independently Visualized: Radiograph images, which show right IJ  clot, causing neck discomfort    Critical Interventions-clinical evaluation, laboratory testing, narcotic analgesia, radiography, observation reassessment  After These Interventions, the Patient was reevaluated and was found to require hospitalization for thrombosed right IJ, associated with indwelling central catheter.  Likely predisposed to clotting because of the catheter as well as the oncologic disorder.  At this time the central catheter appears functional.  She has a headache which is likely secondary to the clot.  No acute intracranial abnormalities.  Hospitalist will be contacted for admission.  CRITICAL CARE-yes Performed by: Daleen Bo  Nursing Notes Reviewed/ Care Coordinated Applicable Imaging Reviewed Interpretation of Laboratory Data incorporated into ED treatment  7:20 PM-Consult complete with hospitalist. Patient case explained and discussed.  He agrees to admit patient for further evaluation and treatment. Call ended at 7:25 PM    Final Clinical Impression(s) / ED Diagnoses Final diagnoses:  Venous thromboembolism    Rx / DC Orders ED Discharge Orders     None        Daleen Bo, MD 02/22/21 1932

## 2021-02-22 NOTE — ED Provider Notes (Signed)
Emergency Medicine Provider Triage Evaluation Note  Lydia Collins , a 55 y.o. female  was evaluated in triage.  Pt complains of right side neck pain. Port placed 2/22 by Dr. [name], no problems until 1 week ago when port was bothering her. Went to [facility] ER but told everything was ok. Woke up tomorrow morning with pain and swelling to right side neck, feels dizzy/terrible.  On chemo for breast ca, last tx was 2 weeks ago. No fevers, felt cold this morning and took Tylenol #3.Recheck temp 98.8, states her hr is normally elevated.  Emergency Medicine Provider Triage Evaluation Note   Review of Systems  Positive: Mild redness, tenderness right side neck Negative: Fever, vomiting, weakness  Physical Exam  BP (!) 129/93 (BP Location: Right Arm)   Pulse (!) 112   Temp 99.2 F (37.3 C) (Oral)   Resp 18   LMP 04/05/2012   SpO2 99%  Gen:   Awake, no distress   Resp:  Normal effort  MSK:   Moves extremities without difficulty  Other:  Mild redness, tenderness, swelling over right side neck/port  Medical Decision Making  Medically screening exam initiated at 12:21 PM.  Appropriate orders placed.  KIRSTON LUTY was informed that the remainder of the evaluation will be completed by another provider, this initial triage assessment does not replace that evaluation, and the importance of remaining in the ED until their evaluation is complete.     Tacy Learn, PA-C 02/22/21 1222    Milton Ferguson, MD 02/24/21 1009

## 2021-02-22 NOTE — ED Triage Notes (Signed)
Pt complains of right neck pain and swelling down to the port in her right chest. She is concerned the port is infected. Also reports dizziness and headache since 3AM this morning.

## 2021-02-22 NOTE — Progress Notes (Signed)
ANTICOAGULATION CONSULT NOTE - Initial Consult  Pharmacy Consult for IV heparin Indication: DVT  Allergies  Allergen Reactions   Ibuprofen    Naproxen     Patient Measurements:   Heparin Dosing Weight: 72.6 kg  Vital Signs: Temp: 98.8 F (37.1 C) (06/18 1802) Temp Source: Oral (06/18 1802) BP: 109/65 (06/18 1802) Pulse Rate: 104 (06/18 1802)  Labs: Recent Labs    02/22/21 1243  HGB 11.2*  HCT 33.5*  PLT 127*  CREATININE 0.82    Estimated Creatinine Clearance: 78.4 mL/min (by C-G formula based on SCr of 0.82 mg/dL).   Medical History: Past Medical History:  Diagnosis Date   Anemia    Anxiety    Cancer ([place]) 08/2020   [facility] left breast   Chronic narcotic use    Chronic pain    Dyspnea    with activity   Hypertension    Smoker     Medications:  (Not in a hospital admission)  Scheduled:   sodium chloride (PF)       Infusions:  PRN: fentaNYL (SUBLIMAZE) injection  Assessment: 55 yo female presented to ER with neck pain and swelling. Patient also with breast cancer getting Taxol but don't see in [facility] when last dose was given. 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 Received orders to start IV heparin per pharmacy. Baseline CBC already obtained. Patient not on any anticoag meds PTA.   Goal of Therapy:  Heparin level 0.3-0.7 units/ml Monitor platelets by anticoagulation protocol: Yes   Plan:  IV heparin bolus of 2000 units then IV heparin infusion rate of 1150 units/hr Check heparin level 6 hours after start of IV heparin Daily CBC Change to another anticoagulant as soon as possible per guidelines  Kara Mead 02/22/2021,6:11 PM

## 2021-02-22 NOTE — ED Notes (Addendum)
Pt requesting anxiety medicine, takes states she takes xanex at home, provider aware

## 2021-02-22 NOTE — H&P (Signed)
History and Physical    Lydia Collins FWY:[id number] DOB: 1965/12/10 DOA: 02/22/2021  PCP: Patient, [name]  Patient coming from: Home  I have personally briefly reviewed patient's old medical records in [place]  Chief Complaint: Neck pain and swelling  HPI: Lydia Collins is a 55 y.o. female with medical history significant of breast CA, HTN, anxiety.  Pt s/p resection and lymph node dissection of breast CA.  CA is ER+.  CA was in multiple lymph nodes but no known bony / organ mets at this point.  Pt on chemotherapy with Dr. Lindi Adie.  Pt presents to ED with c/o pain and swelling in R side of neck, associated with headache.  Onset about 1 week ago.  Persistent.  Nothing makes better or worse.  Last infusion in port about 8 days ago.  No fever, N/V, SOB, CP.   ED Course: Has RIJ thrombosis associated with RIJ Port-a-cath.   Review of Systems: As per HPI, otherwise all review of systems negative.  Past Medical History:  Diagnosis Date   Anemia    Anxiety    Cancer ([place]) 08/2020   [place] left breast   Chronic narcotic use    Chronic pain    Dyspnea    with activity   Hypertension    Smoker     Past Surgical History:  Procedure Laterality Date   ABDOMINAL HYSTERECTOMY     partial hysterectomy    AXILLARY LYMPH NODE DISSECTION Left 11/08/2020   Procedure: LEFT AXILLARY LYMPH NODE DISSECTION;  Surgeon: Jovita Kussmaul, MD;  Location: [facility];  Service: General;  Laterality: Left;   BREAST LUMPECTOMY WITH RADIOACTIVE SEED AND SENTINEL LYMPH NODE BIOPSY Left 09/13/2020   Procedure: LEFT BREAST LUMPECTOMY WITH RADIOACTIVE SEED AND SENTINEL LYMPH NODE BIOPSY;  Surgeon: Jovita Kussmaul, MD;  Location: [facility];  Service: General;  Laterality: Left;   PORTACATH PLACEMENT N/A 12/05/2020   Procedure: INSERTION PORT-A-CATH;  Surgeon: Jovita Kussmaul, MD;  Location: [facility];  Service: General;  Laterality: N/A;    RE-EXCISION OF BREAST LUMPECTOMY Left 11/08/2020   Procedure: RE-EXCISION OF LEFT BREAST MARGIN;  Surgeon: Jovita Kussmaul, MD;  Location: [facility];  Service: General;  Laterality: Left;     reports that she has been smoking cigarettes. She has been smoking an average of 0.50 packs per day. She has never used smokeless tobacco. She reports that she does not drink alcohol and does not use drugs.  Allergies  Allergen Reactions   Ibuprofen Other (See Comments)    Stomach burns   Naproxen Other (See Comments)    Stomach burns    Family History  Problem Relation Age of Onset   Breast cancer Neg Hx      Prior to Admission medications   Medication Sig Start Date End Date Taking? Authorizing Provider  acetaminophen (TYLENOL) 500 MG tablet Take 1,000-1,500 mg by mouth every 6 (six) hours as needed for fever (pain).   Yes [provider]  ALPRAZolam Duanne Moron) 0.5 MG tablet Take 0.5 mg by mouth at bedtime as needed for anxiety or sleep.   Yes [provider]  cetirizine (ZYRTEC) 10 MG tablet Take 10 mg by mouth every morning.   Yes [provider]  chlorthalidone (HYGROTON) 25 MG tablet Take 25 mg by mouth every morning. 05/06/20  Yes [provider]  clonazePAM (KLONOPIN) 0.5 MG tablet Take 0.5 mg by mouth at  bedtime as needed for anxiety (sleep).   Yes [provider]  dexamethasone (DECADRON) 4 MG tablet Take 1 tablet (4 mg total) by mouth daily. Take 1 tablet day after chemo and 1 tablet 2 days after chemo with food Patient taking differently: Take 4 mg by mouth See admin instructions. Take one tablet (4 mg) by mouth daily for 4 days starting the day after chemo - take with food 11/27/20  Yes Nicholas Lose, MD  DULoxetine (CYMBALTA) 30 MG capsule Take 30 mg by mouth daily with breakfast.   Yes [provider]  lidocaine-prilocaine (EMLA) cream Apply to affected area once Patient taking differently: Apply 1 application topically  once as needed (prior to port access). 11/27/20  Yes Nicholas Lose, MD  ondansetron (ZOFRAN) 8 MG tablet TAKE 1 TABLET (8 MG TOTAL) BY MOUTH 2 (TWO) TIMES DAILY AS NEEDED. START ON THE THIRD DAY AFTER CHEMOTHERAPY. Patient taking differently: Take 8 mg by mouth every 12 (twelve) hours as needed for nausea or vomiting. 01/02/21  Yes Nicholas Lose, MD  oxyCODONE (OXY IR/ROXICODONE) 5 MG immediate release tablet Take 5 mg by mouth 4 (four) times daily as needed (pain). 12/30/20  Yes [provider]  potassium chloride SA (KLOR-CON) 20 MEQ tablet Take 1 tablet (20 mEq total) by mouth daily. Patient taking differently: Take 20 mEq by mouth every morning. 02/11/21  Yes Nicholas Lose, MD  prochlorperazine (COMPAZINE) 10 MG tablet Take 1 tablet (10 mg total) by mouth every 6 (six) hours as needed (Nausea or vomiting). 02/11/21  Yes Nicholas Lose, MD  gabapentin (NEURONTIN) 100 MG capsule Take 3 capsules (300 mg total) by mouth at bedtime. Patient not taking: No sig reported 10/07/20 02/22/21  Nicholas Lose, MD    Physical Exam: Vitals:   02/22/21 1700 02/22/21 1802 02/22/21 1845 02/22/21 1915  BP: 106/74 109/65 101/70 111/72  Pulse: 100 (!) 104 (!) 111 (!) 101  Resp: 17 17 18  (!) 25  Temp:  98.8 F (37.1 C)    TempSrc:  Oral    SpO2: 98% 99% 98% 98%    Constitutional: NAD, calm, comfortable Eyes: PERRL, lids and conjunctivae normal ENMT: Mucous membranes are moist. Posterior pharynx clear of any exudate or lesions.Normal dentition.  Neck: Tenderness and swelling to R lateral neck. Respiratory: clear to auscultation bilaterally, no wheezing, no crackles. Normal respiratory effort. No accessory muscle use.  Cardiovascular: Regular rate and rhythm, no murmurs / rubs / gallops. No extremity edema. 2+ pedal pulses. No carotid bruits.  Abdomen: no tenderness, no masses palpated. No hepatosplenomegaly. Bowel sounds positive.  Musculoskeletal: no clubbing / cyanosis. No joint deformity upper and lower  extremities. Good ROM, no contractures. Normal muscle tone.  Skin: no rashes, lesions, ulcers. No induration Neurologic: CN 2-12 grossly intact. Sensation intact, DTR normal. Strength 5/5 in all 4.  Psychiatric: Normal judgment and insight. Alert and oriented x 3. Normal mood.    Labs on Admission: I have personally reviewed following labs and imaging studies  CBC: Recent Labs  Lab 02/22/21 1243  WBC 8.4  NEUTROABS 5.8  HGB 11.2*  HCT 33.5*  MCV 89.3  PLT 973*   Basic Metabolic Panel: Recent Labs  Lab 02/22/21 1243  NA 136  K 3.0*  CL 99  CO2 25  GLUCOSE 108*  BUN 13  CREATININE 0.82  CALCIUM 9.1   GFR: Estimated Creatinine Clearance: 78.4 mL/min (by C-G formula based on SCr of 0.82 mg/dL). Liver Function Tests: Recent Labs  Lab 02/22/21 1243  AST 14*  ALT 12  ALKPHOS 98  BILITOT 0.2*  PROT 7.3  ALBUMIN 3.8   No results for input(s): LIPASE, AMYLASE in the last 168 hours. No results for input(s): AMMONIA in the last 168 hours. Coagulation Profile: No results for input(s): INR, PROTIME in the last 168 hours. Cardiac Enzymes: No results for input(s): CKTOTAL, CKMB, CKMBINDEX, TROPONINI in the last 168 hours. BNP (last 3 results) No results for input(s): PROBNP in the last 8760 hours. HbA1C: No results for input(s): HGBA1C in the last 72 hours. CBG: No results for input(s): GLUCAP in the last 168 hours. Lipid Profile: No results for input(s): CHOL, HDL, LDLCALC, TRIG, CHOLHDL, LDLDIRECT in the last 72 hours. Thyroid Function Tests: No results for input(s): TSH, T4TOTAL, FREET4, T3FREE, THYROIDAB in the last 72 hours. Anemia Panel: No results for input(s): VITAMINB12, FOLATE, FERRITIN, TIBC, IRON, RETICCTPCT in the last 72 hours. Urine analysis:    Component Value Date/Time   COLORURINE YELLOW 05/03/2012 2336   APPEARANCEUR CLEAR 05/03/2012 2336   LABSPEC 1.019 05/03/2012 2336   PHURINE 6.0 05/03/2012 2336   GLUCOSEU NEGATIVE 05/03/2012 2336   HGBUR  NEGATIVE 05/03/2012 2336   BILIRUBINUR NEGATIVE 05/03/2012 2336   KETONESUR NEGATIVE 05/03/2012 2336   PROTEINUR NEGATIVE 05/03/2012 2336   UROBILINOGEN 0.2 05/03/2012 2336   NITRITE NEGATIVE 05/03/2012 2336   LEUKOCYTESUR NEGATIVE 05/03/2012 2336    Radiological Exams on Admission: CT Head Wo Contrast  Result Date: 02/22/2021 CLINICAL DATA:  Headache. Patient reports right-sided neck pain and swelling. Breast cancer with active chemotherapy EXAM: CT HEAD WITHOUT CONTRAST TECHNIQUE: Contiguous axial images were obtained from the base of the skull through the vertex without intravenous contrast. COMPARISON:  Head CT 10/23/2015 FINDINGS: Brain: No intracranial hemorrhage, mass effect, or midline shift. No hydrocephalus. The basilar cisterns are patent. No evidence of territorial infarct or acute ischemia. No extra-axial or intracranial fluid collection. Vascular: No hyperdense vessel. Skull: Normal. Negative for fracture or focal lesion. Sinuses/Orbits: Paranasal sinuses and mastoid air cells are clear. The visualized orbits are unremarkable. Other: None IMPRESSION: Negative noncontrast head CT. Electronically Signed   By: Keith Rake M.D.   On: 02/22/2021 17:31   CT Soft Tissue Neck W Contrast  Result Date: 02/22/2021 CLINICAL DATA:  Soft tissue mass, neck, spontaneous hemorrhage. Right neck pain and swelling into the right chest. Patient is concerned about infection of her Port-A-Cath. EXAM: CT NECK WITH CONTRAST TECHNIQUE: Multidetector CT imaging of the neck was performed using the standard protocol following the bolus administration of intravenous contrast. CONTRAST:  108mL OMNIPAQUE IOHEXOL 300 MG/ML  SOLN COMPARISON:  None. FINDINGS: Pharynx and larynx: No focal mucosal lesions are present. Salivary glands: Submandibular and parotid glands and ducts are within limits. Thyroid: Normal. Lymph nodes: Normal sized reactive type lymph nodes are present bilaterally. Vascular: The right internal  jugular vein is thrombosed from the angle the mandible to just above where the innominate vein enters. Thrombus surrounds the tunneled catheter. There is enhancement about the vein with some surrounding inflammatory change. Minimal atherosclerotic changes are present on both carotid bifurcations. Atherosclerotic changes are present at the aortic arch. Limited intracranial: Within normal limits Visualized orbits: The globes and orbits are within normal limits. Mastoids and visualized paranasal sinuses: The paranasal sinuses and mastoid air cells are clear. Skeleton: Degenerative changes the cervical spine most pronounced at C5-6 with uncovertebral facet hypertrophy contributing to foraminal narrowing bilaterally. No focal lytic or blastic lesions are present. Upper chest: Lung apices are clear. Thoracic  inlet is within normal limits. IMPRESSION: 1. 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 2. Thrombus surrounds the tunneled catheter. 3. No other acute or focal lesion to explain the patient's symptoms. 4. Degenerative changes of the cervical spine are most pronounced at C5-6 with uncovertebral facet hypertrophy contributing to foraminal narrowing bilaterally. 5. Aortic Atherosclerosis (ICD10-I70.0). Critical Value/emergent results were called by telephone at the time of interpretation on 02/22/2021 at 5:55 pm to provider [facility] , who verbally acknowledged these results. Electronically Signed   By: San Morelle M.D.   On: 02/22/2021 17:55    EKG: Independently reviewed.  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 [name]), re: decide if port-a-cath can be salvaged or needs to be removed. Breast CA - Chemo pt of Dr. Lindi Adie Anxiety - Cont home benzos PRN  DVT prophylaxis: Heparin gtt Code Status: Full Family Communication: No family in room Disposition Plan: Home after treatment for RIJ thrombus Consults called: Put consult order for oncology into Epic as per our routine when admitting a chemo patient; also sending message to Dr. Redmond Pulling Admission status: Place in obs    Kervens Roper, [place] M. DO [facility]  How to contact the [facility] Attending or Consulting provider [place] or covering provider during after hours [place], for this patient?  Check the care team in [facility] and look for a) attending/consulting [facility] provider listed and b) the [facility] team listed Log into [email or web address]  Amion Physician Scheduling and messaging for groups and whole hospitals  On call and physician scheduling software for group practices, residents, hospitalists and other medical providers for call, clinic, rotation and shift schedules. OnCall Enterprise is a hospital-wide system for scheduling doctors and paging doctors on call. EasyPlot is for scientific plotting and data analysis.  [email or web address]  and use [facility]'s universal password to access. If you do not have the password, please contact the hospital operator.  Locate the [facility] provider you are looking for under [facility] and page to a number that you can be directly reached. If you still have difficulty reaching the provider, please page the [facility] (Director on Call) for the Hospitalists listed on amion for assistance.  02/22/2021, 8:34 PM

## 2021-02-23 DIAGNOSIS — I10 Essential (primary) hypertension: Secondary | ICD-10-CM | POA: Diagnosis present

## 2021-02-23 DIAGNOSIS — I8290 Acute embolism and thrombosis of unspecified vein: Secondary | ICD-10-CM | POA: Diagnosis present

## 2021-02-23 DIAGNOSIS — G8929 Other chronic pain: Secondary | ICD-10-CM | POA: Diagnosis present

## 2021-02-23 DIAGNOSIS — Z20822 Contact with and (suspected) exposure to covid-19: Secondary | ICD-10-CM | POA: Diagnosis present

## 2021-02-23 DIAGNOSIS — Z170 Estrogen receptor positive status [ER+]: Secondary | ICD-10-CM | POA: Diagnosis not present

## 2021-02-23 DIAGNOSIS — F17210 Nicotine dependence, cigarettes, uncomplicated: Secondary | ICD-10-CM | POA: Diagnosis present

## 2021-02-23 DIAGNOSIS — D6869 Other thrombophilia: Secondary | ICD-10-CM | POA: Diagnosis present

## 2021-02-23 DIAGNOSIS — I82C11 Acute embolism and thrombosis of right internal jugular vein: Secondary | ICD-10-CM | POA: Diagnosis present

## 2021-02-23 DIAGNOSIS — Z79899 Other long term (current) drug therapy: Secondary | ICD-10-CM | POA: Diagnosis not present

## 2021-02-23 DIAGNOSIS — T82868A Thrombosis of vascular prosthetic devices, implants and grafts, initial encounter: Secondary | ICD-10-CM | POA: Diagnosis present

## 2021-02-23 DIAGNOSIS — F419 Anxiety disorder, unspecified: Secondary | ICD-10-CM | POA: Diagnosis present

## 2021-02-23 DIAGNOSIS — Z886 Allergy status to analgesic agent status: Secondary | ICD-10-CM | POA: Diagnosis not present

## 2021-02-23 DIAGNOSIS — Y828 Other medical devices associated with adverse incidents: Secondary | ICD-10-CM | POA: Diagnosis present

## 2021-02-23 DIAGNOSIS — C50512 Malignant neoplasm of lower-outer quadrant of left female breast: Secondary | ICD-10-CM | POA: Diagnosis present

## 2021-02-23 DIAGNOSIS — E876 Hypokalemia: Secondary | ICD-10-CM | POA: Diagnosis not present

## 2021-02-23 DIAGNOSIS — Y838 Other surgical procedures as the cause of abnormal reaction of the patient, or of later complication, without mention of misadventure at the time of the procedure: Secondary | ICD-10-CM | POA: Diagnosis present

## 2021-02-23 DIAGNOSIS — Z95828 Presence of other vascular implants and grafts: Secondary | ICD-10-CM | POA: Diagnosis not present

## 2021-02-23 DIAGNOSIS — I82409 Acute embolism and thrombosis of unspecified deep veins of unspecified lower extremity: Secondary | ICD-10-CM | POA: Diagnosis present

## 2021-02-23 DIAGNOSIS — D696 Thrombocytopenia, unspecified: Secondary | ICD-10-CM | POA: Diagnosis present

## 2021-02-23 LAB — CBC
HCT: 30.6 % — ABNORMAL LOW (ref 36.0–46.0)
Hemoglobin: 10.1 g/dL — ABNORMAL LOW (ref 12.0–15.0)
MCH: 30.2 pg (ref 26.0–34.0)
MCHC: 33 g/dL (ref 30.0–36.0)
MCV: 91.6 fL (ref 80.0–100.0)
Platelets: 127 10*3/uL — ABNORMAL LOW (ref 150–400)
RBC: 3.34 MIL/uL — ABNORMAL LOW (ref 3.87–5.11)
RDW: 15.6 % — ABNORMAL HIGH (ref 11.5–15.5)
WBC: 8.7 10*3/uL (ref 4.0–10.5)
nRBC: 0.6 % — ABNORMAL HIGH (ref 0.0–0.2)

## 2021-02-23 LAB — BASIC METABOLIC PANEL
Anion gap: 9 (ref 5–15)
BUN: 12 mg/dL (ref 6–20)
CO2: 26 mmol/L (ref 22–32)
Calcium: 8.6 mg/dL — ABNORMAL LOW (ref 8.9–10.3)
Chloride: 103 mmol/L (ref 98–111)
Creatinine, Ser: 0.72 mg/dL (ref 0.44–1.00)
GFR, Estimated: >60 mL/min (ref >60)
Glucose, Bld: 99 mg/dL (ref 70–99)
Potassium: 3.5 mmol/L (ref 3.5–5.1)
Sodium: 138 mmol/L (ref 135–145)

## 2021-02-23 LAB — HEPARIN LEVEL (UNFRACTIONATED)
Heparin Unfractionated: 0.18 IU/mL — ABNORMAL LOW (ref 0.30–0.70)
Heparin Unfractionated: 0.27 IU/mL — ABNORMAL LOW (ref 0.30–0.70)
Heparin Unfractionated: 0.54 IU/mL (ref 0.30–0.70)

## 2021-02-23 LAB — SARS CORONAVIRUS 2 (TAT 6-24 HRS): SARS Coronavirus 2: NEGATIVE

## 2021-02-23 LAB — HIV ANTIBODY (ROUTINE TESTING W REFLEX): HIV Screen 4th Generation wRfx: NONREACTIVE

## 2021-02-23 MED ORDER — HEPARIN BOLUS VIA INFUSION
2000.0000 [IU] | Freq: Once | INTRAVENOUS | Status: AC
  Administered 2021-02-23: 2000 [IU] via INTRAVENOUS
  Filled 2021-02-23: qty 2000

## 2021-02-23 MED ORDER — POLYETHYLENE GLYCOL 3350 17 G PO PACK: 17.0000 g | PACK | Freq: Every day | ORAL | Status: DC | PRN

## 2021-02-23 MED ORDER — FENTANYL CITRATE (PF) 100 MCG/2ML IJ SOLN
25.0000 ug | INTRAMUSCULAR | Status: DC | PRN
  Administered 2021-02-24: 25 ug via INTRAVENOUS
  Filled 2021-02-23: qty 2

## 2021-02-23 MED ORDER — OXYCODONE HCL 5 MG PO TABS
5.0000 mg | ORAL_TABLET | Freq: Four times a day (QID) | ORAL | Status: DC | PRN
  Administered 2021-02-23 – 2021-02-24 (×4): 10 mg via ORAL
  Filled 2021-02-23 (×4): qty 2

## 2021-02-23 MED ORDER — SENNOSIDES-DOCUSATE SODIUM 8.6-50 MG PO TABS
1.0000 | ORAL_TABLET | Freq: Two times a day (BID) | ORAL | Status: DC
  Filled 2021-02-23: qty 1

## 2021-02-23 MED ORDER — ALPRAZOLAM 0.5 MG PO TABS
0.5000 mg | ORAL_TABLET | Freq: Three times a day (TID) | ORAL | Status: DC | PRN
  Administered 2021-02-23 – 2021-02-24 (×2): 0.5 mg via ORAL
  Filled 2021-02-23 (×2): qty 1

## 2021-02-23 MED ORDER — BISACODYL 10 MG RE SUPP: 10.0000 mg | Freq: Every day | RECTAL | Status: DC | PRN

## 2021-02-23 NOTE — Progress Notes (Signed)
Patient ID: Lydia Collins, female   DOB: 08/31/1966, 55 y.o.   MRN: [id number]  PROGRESS NOTE    Lydia Collins  FVC:[id number] DOB: [birth year] DOA: 02/22/2021 PCP: Patient, [name]   Brief Narrative:  55 year old female with history of breast cancer on chemotherapy, hypertension, anxiety presented with right neck pain and swelling with headache.  On presentation, she was found to have right IJ thrombosis possibly associated with right IJ Port-A-Cath.  She was started on heparin drip.  Assessment & Plan:    Right IJ thrombosis -Possibly related to right IJ Port-A-Cath and hypercoagulable state secondary to cancer -Continue heparin drip.  Spoke with Dr. Shadad/oncology on phone who recommended to continue heparin drip for at least 24 hours and possibly switch to DOAC afterwards.  We will transition to Eliquis or Xarelto possibly tomorrow and discharge her home.  TOC consult for DOAC needs. -No need for emergent removal of Port-A-Cath as per oncology.  General surgery is also of the same opinion.  Thrombocytopenia -No signs of bleeding.  Monitor.  Breast cancer -Currently undergoing chemotherapy as per Dr. Lindi Adie.  Outpatient follow-up with oncology.  Anxiety -Continue duloxetine and as needed Xanax   DVT prophylaxis: Heparin drip Code Status: Full Family Communication: None  Disposition Plan: Status is: Observation  The patient will require care spanning > 2 midnights and should be moved to inpatient because: Inpatient level of care appropriate due to severity of illness  Dispo: The patient is from: Home              Anticipated d/c is to: Home              Patient currently is not medically stable to d/c.   Difficult to place patient No   Consultants: General surgery.  Spoke to Dr. Shadad/oncology on phone  Procedures: None  Antimicrobials: None   Subjective: Patient seen and examined at bedside.  Complains of intermittent right neck pain.  No overnight  fever, vomiting, worsening shortness of breath reported.  Objective: Vitals:   02/22/21 2047 02/23/21 0013 02/23/21 0440 02/23/21 0815  BP: 94/65 103/76 96/68 101/63  Pulse: 100 89 (!) 103 (!) 104  Resp: (!) 21 19 16    Temp: 97.9 F (36.6 C) 99.4 F (37.4 C) 98.7 F (37.1 C) 98.7 F (37.1 C)  TempSrc:  Oral Oral Oral  SpO2: 97% 100% 92% 99%  Weight:      Height:        Intake/Output Summary (Last 24 hours) at 02/23/2021 1138 Last data filed at 02/23/2021 0227 Gross per 24 hour  Intake 139.75 ml  Output --  Net 139.75 ml   Filed Weights   02/22/21 2042  Weight: 71.4 kg    Examination:  General exam: Appears calm and comfortable.  On room air currently.  Looks chronically ill. Respiratory system: Bilateral decreased breath sounds at bases Cardiovascular system: S1 & S2 heard, Rate controlled Gastrointestinal system: Abdomen is nondistended, soft and nontender. Normal bowel sounds heard. Extremities: No cyanosis, clubbing, edema  Central nervous system: Sleepy, wakes up slightly, answers a few questions.  No focal neurological deficits. Moving extremities Skin: No rashes, lesions or ulcers Psychiatry: Mostly flat affect.  Intermittently gets anxious.   Data Reviewed: I have personally reviewed following labs and imaging studies  CBC: Recent Labs  Lab 02/22/21 1243 02/23/21 0132  WBC 8.4 8.7  NEUTROABS 5.8  --   HGB 11.2* 10.1*  HCT 33.5* 30.6*  MCV 89.3 91.6  PLT 127* 419*   Basic Metabolic Panel: Recent Labs  Lab 02/22/21 1243 02/23/21 0132  NA 136 138  K 3.0* 3.5  CL 99 103  CO2 25 26  GLUCOSE 108* 99  BUN 13 12  CREATININE 0.82 0.72  CALCIUM 9.1 8.6*   GFR: Estimated Creatinine Clearance: 79.7 mL/min (by C-G formula based on SCr of 0.72 mg/dL). Liver Function Tests: Recent Labs  Lab 02/22/21 1243  AST 14*  ALT 12  ALKPHOS 98  BILITOT 0.2*  PROT 7.3  ALBUMIN 3.8   No results for input(s): LIPASE, AMYLASE in the last 168 hours. No  results for input(s): AMMONIA in the last 168 hours. Coagulation Profile: No results for input(s): INR, PROTIME in the last 168 hours. Cardiac Enzymes: No results for input(s): CKTOTAL, CKMB, CKMBINDEX, TROPONINI in the last 168 hours. BNP (last 3 results) No results for input(s): PROBNP in the last 8760 hours. HbA1C: No results for input(s): HGBA1C in the last 72 hours. CBG: No results for input(s): GLUCAP in the last 168 hours. Lipid Profile: No results for input(s): CHOL, HDL, LDLCALC, TRIG, CHOLHDL, LDLDIRECT in the last 72 hours. Thyroid Function Tests: No results for input(s): TSH, T4TOTAL, FREET4, T3FREE, THYROIDAB in the last 72 hours. Anemia Panel: No results for input(s): VITAMINB12, FOLATE, FERRITIN, TIBC, IRON, RETICCTPCT in the last 72 hours. Sepsis Labs: Recent Labs  Lab 02/22/21 1243 02/22/21 1505  LATICACIDVEN 1.0 1.4    Recent Results (from the past 240 hour(s))  Culture, blood (single)     Status: None (Preliminary result)   Collection Time: 02/22/21 12:44 PM   Specimen: BLOOD  Result Value Ref Range Status   Specimen Description   Final    BLOOD RIGHT ANTECUBITAL Performed at [facility] [address], [place], [place] [address]    Special Requests   Final    BOTTLES DRAWN AEROBIC AND ANAEROBIC Blood Culture adequate volume Performed at [facility] [address], [place], [place] [address]    Culture   Final    NO GROWTH < 12 HOURS Performed at [facility] [address] [place], [place] [address]    Report Status PENDING  Incomplete  SARS CORONAVIRUS 2 (TAT 6-24 HRS) Nasopharyngeal Nasopharyngeal Swab     Status: None   Collection Time: 02/22/21  7:02 PM   Specimen: Nasopharyngeal Swab  Result Value Ref Range Status   SARS Coronavirus 2 NEGATIVE NEGATIVE Final    Comment: (NOTE) SARS-CoV-2 target nucleic acids are NOT DETECTED.  The SARS-CoV-2 RNA is generally detectable in upper and  lower respiratory specimens during the acute phase of infection. Negative results do not preclude SARS-CoV-2 infection, do not rule out co-infections with other pathogens, and should not be used as the sole basis for treatment or other patient management decisions. Negative results must be combined with clinical observations, patient history, and epidemiological information. The expected result is Negative.  Fact Sheet for Patients: SugarRoll.be  Fact Sheet for Healthcare Providers: https://www.woods-mathews.com/  This test is not yet approved or cleared by the [place] FDA and  has been authorized for detection and/or diagnosis of SARS-CoV-2 by FDA under an Emergency Use Authorization (EUA). This EUA will remain  in effect (meaning this test can be used) for the duration of the COVID-19 declaration under Se ction 564(b)(1) of the Act, 21 U.S.C. section 360bbb-3(b)(1), unless the authorization is terminated or revoked sooner.  Performed at [facility], [place],  [place] [address]          Radiology Studies: CT Head Wo Contrast  Result Date: 02/22/2021 CLINICAL DATA:  Headache. Patient reports right-sided neck pain and swelling. Breast cancer with active chemotherapy EXAM: CT HEAD WITHOUT CONTRAST TECHNIQUE: Contiguous axial images were obtained from the base of the skull through the vertex without intravenous contrast. COMPARISON:  Head CT 10/23/2015 FINDINGS: Brain: No intracranial hemorrhage, mass effect, or midline shift. No hydrocephalus. The basilar cisterns are patent. No evidence of territorial infarct or acute ischemia. No extra-axial or intracranial fluid collection. Vascular: No hyperdense vessel. Skull: Normal. Negative for fracture or focal lesion. Sinuses/Orbits: Paranasal sinuses and mastoid air cells are clear. The visualized orbits are unremarkable. Other: None IMPRESSION: Negative noncontrast head  CT. Electronically Signed   By: Keith Rake M.D.   On: 02/22/2021 17:31   CT Soft Tissue Neck W Contrast  Result Date: 02/22/2021 CLINICAL DATA:  Soft tissue mass, neck, spontaneous hemorrhage. Right neck pain and swelling into the right chest. Patient is concerned about infection of her Port-A-Cath. EXAM: CT NECK WITH CONTRAST TECHNIQUE: Multidetector CT imaging of the neck was performed using the standard protocol following the bolus administration of intravenous contrast. CONTRAST:  42mL OMNIPAQUE IOHEXOL 300 MG/ML  SOLN COMPARISON:  None. FINDINGS: Pharynx and larynx: No focal mucosal lesions are present. Salivary glands: Submandibular and parotid glands and ducts are within limits. Thyroid: Normal. Lymph nodes: Normal sized reactive type lymph nodes are present bilaterally. Vascular: The right internal jugular vein is thrombosed from the angle the mandible to just above where the innominate vein enters. Thrombus surrounds the tunneled catheter. There is enhancement about the vein with some surrounding inflammatory change. Minimal atherosclerotic changes are present on both carotid bifurcations. Atherosclerotic changes are present at the aortic arch. Limited intracranial: Within normal limits Visualized orbits: The globes and orbits are within normal limits. Mastoids and visualized paranasal sinuses: The paranasal sinuses and mastoid air cells are clear. Skeleton: Degenerative changes the cervical spine most pronounced at C5-6 with uncovertebral facet hypertrophy contributing to foraminal narrowing bilaterally. No focal lytic or blastic lesions are present. Upper chest: Lung apices are clear. Thoracic inlet is within normal limits. IMPRESSION: 1. 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 2. Thrombus surrounds the tunneled catheter. 3. No other acute or focal lesion to explain the patient's symptoms. 4. Degenerative changes  of the cervical spine are most pronounced at C5-6 with uncovertebral facet hypertrophy contributing to foraminal narrowing bilaterally. 5. Aortic Atherosclerosis (ICD10-I70.0). Critical Value/emergent results were called by telephone at the time of interpretation on 02/22/2021 at 5:55 pm to provider [facility] , who verbally acknowledged these results. Electronically Signed   By: San Morelle M.D.   On: 02/22/2021 17:55        Scheduled Meds:  chlorthalidone  25 mg Oral q morning   DULoxetine  30 mg Oral Q breakfast   potassium chloride SA  20 mEq Oral q morning   Continuous Infusions:  heparin 1,350 Units/hr (02/23/21 0227)          Aline August, MD [facility] 02/23/2021, 11:38 AM

## 2021-02-23 NOTE — Progress Notes (Signed)
Pt undergoing chemotherapy  Defer to oncologist for management  Available to remove it if needed  Agree with anticoagulation

## 2021-02-23 NOTE — Progress Notes (Signed)
Pharmacy: [facility]  Patient's a 55 y.o F with breast cancer currently undergoing chemotherapy treatment, presented to the ED on 6/18 with c/o neck swelling and pain. 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 Heparin drip started on admission for VTE treatment.  - heparin level is therapeutic at 0.54 after rate increased to 1550 units/hr earlier today - no bleeding documented  Goal of Therapy:  Heparin level 0.3-0.7 units/ml Monitor platelets by anticoagulation protocol: Yes  Plan: - continue heparin drip at 1550 units/hr - f/u with AM labs on 6/20 and adjust rate if needed - monitor for s/sx b/eeding  Dia Sitter, PharmD, BCPS 02/23/2021 7:30 PM

## 2021-02-23 NOTE — Progress Notes (Signed)
ANTICOAGULATION CONSULT NOTE - Follow up  Pharmacy Consult for IV heparin Indication: DVT  Allergies  Allergen Reactions   Ibuprofen Other (See Comments)    Stomach burns   Naproxen Other (See Comments)    Stomach burns    Patient Measurements: Height: 5\' 4"  (162.6 cm) Weight: 71.4 kg (157 lb 6.4 oz) IBW/kg (Calculated) : 54.7 Heparin Dosing Weight: 72.6 kg  Vital Signs: Temp: 98.7 F (37.1 C) (06/19 0815) Temp Source: Oral (06/19 0815) BP: 101/63 (06/19 0815) Pulse Rate: 104 (06/19 0815)  Labs: Recent Labs    02/22/21 1243 02/23/21 0132 02/23/21 0847  HGB 11.2* 10.1*  --   HCT 33.5* 30.6*  --   PLT 127* 127*  --   HEPARINUNFRC  --  0.18* 0.27*  CREATININE 0.82 0.72  --      Estimated Creatinine Clearance: 79.7 mL/min (by C-G formula based on SCr of 0.72 mg/dL).   Medical History: Past Medical History:  Diagnosis Date   Anemia    Anxiety    Cancer ([place]) 08/2020   ILC left breast   Chronic narcotic use    Chronic pain    Dyspnea    with activity   Hypertension    Smoker     Medications:  Medications Prior to Admission  Medication Sig Dispense Refill Last Dose   acetaminophen (TYLENOL) 500 MG tablet Take 1,000-1,500 mg by mouth every 6 (six) hours as needed for fever (pain).   02/22/2021 at am   ALPRAZolam (XANAX) 0.5 MG tablet Take 0.5 mg by mouth at bedtime as needed for anxiety or sleep.   02/20/2021 at pm   cetirizine (ZYRTEC) 10 MG tablet Take 10 mg by mouth every morning.   02/21/2021 at am   chlorthalidone (HYGROTON) 25 MG tablet Take 25 mg by mouth every morning.   02/21/2021 at am   clonazePAM (KLONOPIN) 0.5 MG tablet Take 0.5 mg by mouth at bedtime as needed for anxiety (sleep).   few weeks ago   dexamethasone (DECADRON) 4 MG tablet Take 1 tablet (4 mg total) by mouth daily. Take 1 tablet day after chemo and 1 tablet 2 days after chemo with food (Patient taking differently: Take 4 mg by mouth See admin instructions. Take one tablet (4 mg) by mouth  daily for 4 days starting the day after chemo - take with food) 8 tablet 0 week ago   DULoxetine (CYMBALTA) 30 MG capsule Take 30 mg by mouth daily with breakfast.   few days ago   lidocaine-prilocaine (EMLA) cream Apply to affected area once (Patient taking differently: Apply 1 application topically once as needed (prior to port access).) 30 g 3 1 1/2 weeks ago   ondansetron (ZOFRAN) 8 MG tablet TAKE 1 TABLET (8 MG TOTAL) BY MOUTH 2 (TWO) TIMES DAILY AS NEEDED. START ON THE THIRD DAY AFTER CHEMOTHERAPY. (Patient taking differently: Take 8 mg by mouth every 12 (twelve) hours as needed for nausea or vomiting.) 30 tablet 1 02/22/2021 at am   oxyCODONE (OXY IR/ROXICODONE) 5 MG immediate release tablet Take 5 mg by mouth 4 (four) times daily as needed (pain).   week ago   potassium chloride SA (KLOR-CON) 20 MEQ tablet Take 1 tablet (20 mEq total) by mouth daily. (Patient taking differently: Take 20 mEq by mouth every morning.) 30 tablet 3 02/22/2021 at am   prochlorperazine (COMPAZINE) 10 MG tablet Take 1 tablet (10 mg total) by mouth every 6 (six) hours as needed (Nausea or vomiting). 30 tablet 3 02/20/2021  Scheduled:   chlorthalidone  25 mg Oral q morning   DULoxetine  30 mg Oral Q breakfast   potassium chloride SA  20 mEq Oral q morning   Infusions:   heparin 1,350 Units/hr (02/23/21 0227)   PRN: acetaminophen **OR** acetaminophen, ALPRAZolam, clonazePAM, fentaNYL (SUBLIMAZE) injection, ondansetron **OR** ondansetron (ZOFRAN) IV, oxyCODONE, prochlorperazine  Assessment: 55 yo female presented to ER with neck pain and swelling. Patient also with breast cancer getting Taxol but don't see in [facility] when last dose was given. 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 Received orders to start IV heparin per pharmacy. Baseline CBC already obtained. Patient not on any anticoag meds PTA.   02/23/2021 Heparin level increased from last checked but still slightly  SUBtherapeutic on current IV heparin rate of 1350 units/hr Hgb 10.1, Plts 127 No bleeding or line interruptions per RN   Goal of Therapy:  Heparin level 0.3-0.7 units/ml Monitor platelets by anticoagulation protocol: Yes   Plan:  Increase IV heparin drip from 1350 units/hr to 1550 units/hr Recheck heparin level 6 hours  Daily CBC and HL Change to another anticoagulant as soon as possible per guidelines    Adrian Saran, PharmD, BCPS Secure Chat if ?s 02/23/2021 11:35 AM

## 2021-02-23 NOTE — Plan of Care (Signed)
Patient remains on heparin drip, continues to c/o pain in right neck managed with Fentanyl and Oxy.

## 2021-02-23 NOTE — Progress Notes (Signed)
ANTICOAGULATION CONSULT NOTE - Follow up  Pharmacy Consult for IV heparin Indication: DVT  Allergies  Allergen Reactions   Ibuprofen Other (See Comments)    Stomach burns   Naproxen Other (See Comments)    Stomach burns    Patient Measurements: Height: 5\' 4"  (162.6 cm) Weight: 71.4 kg (157 lb 6.4 oz) IBW/kg (Calculated) : 54.7 Heparin Dosing Weight: 72.6 kg  Vital Signs: Temp: 99.4 F (37.4 C) (06/19 0013) Temp Source: Oral (06/19 0013) BP: 103/76 (06/19 0013) Pulse Rate: 89 (06/19 0013)  Labs: Recent Labs    02/22/21 1243 02/23/21 0132  HGB 11.2* 10.1*  HCT 33.5* 30.6*  PLT 127* 127*  HEPARINUNFRC  --  0.18*  CREATININE 0.82  --      Estimated Creatinine Clearance: 77.8 mL/min (by C-G formula based on SCr of 0.82 mg/dL).   Medical History: Past Medical History:  Diagnosis Date   Anemia    Anxiety    Cancer ([place]) 08/2020   ILC left breast   Chronic narcotic use    Chronic pain    Dyspnea    with activity   Hypertension    Smoker     Medications:  Medications Prior to Admission  Medication Sig Dispense Refill Last Dose   acetaminophen (TYLENOL) 500 MG tablet Take 1,000-1,500 mg by mouth every 6 (six) hours as needed for fever (pain).   02/22/2021 at am   ALPRAZolam (XANAX) 0.5 MG tablet Take 0.5 mg by mouth at bedtime as needed for anxiety or sleep.   02/20/2021 at pm   cetirizine (ZYRTEC) 10 MG tablet Take 10 mg by mouth every morning.   02/21/2021 at am   chlorthalidone (HYGROTON) 25 MG tablet Take 25 mg by mouth every morning.   02/21/2021 at am   clonazePAM (KLONOPIN) 0.5 MG tablet Take 0.5 mg by mouth at bedtime as needed for anxiety (sleep).   few weeks ago   dexamethasone (DECADRON) 4 MG tablet Take 1 tablet (4 mg total) by mouth daily. Take 1 tablet day after chemo and 1 tablet 2 days after chemo with food (Patient taking differently: Take 4 mg by mouth See admin instructions. Take one tablet (4 mg) by mouth daily for 4 days starting the day after  chemo - take with food) 8 tablet 0 week ago   DULoxetine (CYMBALTA) 30 MG capsule Take 30 mg by mouth daily with breakfast.   few days ago   lidocaine-prilocaine (EMLA) cream Apply to affected area once (Patient taking differently: Apply 1 application topically once as needed (prior to port access).) 30 g 3 1 1/2 weeks ago   ondansetron (ZOFRAN) 8 MG tablet TAKE 1 TABLET (8 MG TOTAL) BY MOUTH 2 (TWO) TIMES DAILY AS NEEDED. START ON THE THIRD DAY AFTER CHEMOTHERAPY. (Patient taking differently: Take 8 mg by mouth every 12 (twelve) hours as needed for nausea or vomiting.) 30 tablet 1 02/22/2021 at am   oxyCODONE (OXY IR/ROXICODONE) 5 MG immediate release tablet Take 5 mg by mouth 4 (four) times daily as needed (pain).   week ago   potassium chloride SA (KLOR-CON) 20 MEQ tablet Take 1 tablet (20 mEq total) by mouth daily. (Patient taking differently: Take 20 mEq by mouth every morning.) 30 tablet 3 02/22/2021 at am   prochlorperazine (COMPAZINE) 10 MG tablet Take 1 tablet (10 mg total) by mouth every 6 (six) hours as needed (Nausea or vomiting). 30 tablet 3 02/20/2021   Scheduled:   chlorthalidone  25 mg Oral q morning  DULoxetine  30 mg Oral Q breakfast   potassium chloride SA  20 mEq Oral q morning   sodium chloride (PF)       Infusions:   heparin 1,150 Units/hr (02/22/21 1859)   PRN: acetaminophen **OR** acetaminophen, ALPRAZolam, clonazePAM, fentaNYL (SUBLIMAZE) injection, ondansetron **OR** ondansetron (ZOFRAN) IV, oxyCODONE, prochlorperazine  Assessment: 55 yo female presented to ER with neck pain and swelling. Patient also with breast cancer getting Taxol but don't see in [facility] when last dose was given. 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 Received orders to start IV heparin per pharmacy. Baseline CBC already obtained. Patient not on any anticoag meds PTA.   02/23/2021 HL 0.18 subtherapeutic on 1150 units/hr Hgb 10.1, Plts 127 No bleeding or line  interruptions per RN   Goal of Therapy:  Heparin level 0.3-0.7 units/ml Monitor platelets by anticoagulation protocol: Yes   Plan:  IV heparin bolus of 2000 units then Increase heparin drip to 1350 units/hr Check heparin level 6 hours  Daily CBC Change to another anticoagulant as soon as possible per guidelines  Dolly Rias RPh 02/23/2021, 2:17 AM

## 2021-02-24 ENCOUNTER — Telehealth: Payer: Self-pay | Admitting: Hematology and Oncology

## 2021-02-24 LAB — COMPREHENSIVE METABOLIC PANEL
ALT: 9 U/L (ref 0–44)
AST: 10 U/L — ABNORMAL LOW (ref 15–41)
Albumin: 3.1 g/dL — ABNORMAL LOW (ref 3.5–5.0)
Alkaline Phosphatase: 88 U/L (ref 38–126)
Anion gap: 7 (ref 5–15)
BUN: 12 mg/dL (ref 6–20)
CO2: 24 mmol/L (ref 22–32)
Calcium: 8.6 mg/dL — ABNORMAL LOW (ref 8.9–10.3)
Chloride: 108 mmol/L (ref 98–111)
Creatinine, Ser: 0.76 mg/dL (ref 0.44–1.00)
GFR, Estimated: >60 mL/min (ref >60)
Glucose, Bld: 104 mg/dL — ABNORMAL HIGH (ref 70–99)
Potassium: 3 mmol/L — ABNORMAL LOW (ref 3.5–5.1)
Sodium: 139 mmol/L (ref 135–145)
Total Bilirubin: 0.3 mg/dL (ref 0.3–1.2)
Total Protein: 6.1 g/dL — ABNORMAL LOW (ref 6.5–8.1)

## 2021-02-24 LAB — HEPARIN LEVEL (UNFRACTIONATED): Heparin Unfractionated: 0.61 IU/mL (ref 0.30–0.70)

## 2021-02-24 LAB — CBC
HCT: 30.3 % — ABNORMAL LOW (ref 36.0–46.0)
Hemoglobin: 9.9 g/dL — ABNORMAL LOW (ref 12.0–15.0)
MCH: 30.6 pg (ref 26.0–34.0)
MCHC: 32.7 g/dL (ref 30.0–36.0)
MCV: 93.5 fL (ref 80.0–100.0)
Platelets: 177 10*3/uL (ref 150–400)
RBC: 3.24 MIL/uL — ABNORMAL LOW (ref 3.87–5.11)
RDW: 15.9 % — ABNORMAL HIGH (ref 11.5–15.5)
WBC: 10.2 10*3/uL (ref 4.0–10.5)
nRBC: 0.3 % — ABNORMAL HIGH (ref 0.0–0.2)

## 2021-02-24 LAB — MAGNESIUM: Magnesium: 1.9 mg/dL (ref 1.7–2.4)

## 2021-02-24 MED ORDER — APIXABAN 5 MG PO TABS: 5.0000 mg | ORAL_TABLET | Freq: Two times a day (BID) | ORAL | Status: DC

## 2021-02-24 MED ORDER — APIXABAN 5 MG PO TABS
10.0000 mg | ORAL_TABLET | Freq: Two times a day (BID) | ORAL | Status: DC
  Administered 2021-02-24: 10 mg via ORAL
  Filled 2021-02-24 (×3): qty 2

## 2021-02-24 MED ORDER — APIXABAN (ELIQUIS) VTE STARTER PACK (10MG AND 5MG): ORAL_TABLET | ORAL | 0 refills | Status: DC

## 2021-02-24 MED ORDER — POLYETHYLENE GLYCOL 3350 17 G PO PACK: 17.0000 g | PACK | Freq: Every day | ORAL | 0 refills | Status: AC | PRN

## 2021-02-24 MED ORDER — POTASSIUM CHLORIDE CRYS ER 20 MEQ PO TBCR
40.0000 meq | EXTENDED_RELEASE_TABLET | ORAL | Status: AC
  Administered 2021-02-24 (×2): 40 meq via ORAL
  Filled 2021-02-24 (×2): qty 2

## 2021-02-24 MED ORDER — OXYCODONE HCL 5 MG PO TABS: 5.0000 mg | ORAL_TABLET | Freq: Four times a day (QID) | ORAL | 0 refills | Status: DC | PRN

## 2021-02-24 MED ORDER — SENNOSIDES-DOCUSATE SODIUM 8.6-50 MG PO TABS: 1.0000 | ORAL_TABLET | Freq: Two times a day (BID) | ORAL | 0 refills | Status: AC

## 2021-02-24 NOTE — Progress Notes (Addendum)
ANTICOAGULATION CONSULT NOTE - Follow Up Consult  Pharmacy Consult for Apixaban (Eliquis) Indication: VTE  Allergies  Allergen Reactions   Ibuprofen Other (See Comments)    Stomach burns   Naproxen Other (See Comments)    Stomach burns    Patient Measurements: Height: 5\' 4"  (162.6 cm) Weight: 71.4 kg (157 lb 6.4 oz) IBW/kg (Calculated) : 54.7 Heparin Dosing Weight: 69.3 kg  Vital Signs: Temp: 98.7 F (37.1 C) (06/20 0511) Temp Source: Oral (06/20 0511) BP: 103/78 (06/20 0511) Pulse Rate: 91 (06/20 0511)  Labs: Recent Labs    02/22/21 1243 02/22/21 1243 02/23/21 0132 02/23/21 0847 02/23/21 1828 02/24/21 0428  HGB 11.2*  --  10.1*  --   --  9.9*  HCT 33.5*  --  30.6*  --   --  30.3*  PLT 127*  --  127*  --   --  177  HEPARINUNFRC  --    < > 0.18* 0.27* 0.54 0.61  CREATININE 0.82  --  0.72  --   --  0.76   < > = values in this interval not displayed.    Estimated Creatinine Clearance: 79.7 mL/min (by C-G formula based on SCr of 0.76 mg/dL).   Medications:  Medications Prior to Admission  Medication Sig Dispense Refill Last Dose   acetaminophen (TYLENOL) 500 MG tablet Take 1,000-1,500 mg by mouth every 6 (six) hours as needed for fever (pain).   02/22/2021 at am   ALPRAZolam (XANAX) 0.5 MG tablet Take 0.5 mg by mouth at bedtime as needed for anxiety or sleep.   02/20/2021 at pm   cetirizine (ZYRTEC) 10 MG tablet Take 10 mg by mouth every morning.   02/21/2021 at am   chlorthalidone (HYGROTON) 25 MG tablet Take 25 mg by mouth every morning.   02/21/2021 at am   clonazePAM (KLONOPIN) 0.5 MG tablet Take 0.5 mg by mouth at bedtime as needed for anxiety (sleep).   few weeks ago   dexamethasone (DECADRON) 4 MG tablet Take 1 tablet (4 mg total) by mouth daily. Take 1 tablet day after chemo and 1 tablet 2 days after chemo with food 8 tablet 0 week ago   DULoxetine (CYMBALTA) 30 MG capsule Take 30 mg by mouth daily with breakfast.   few days ago   lidocaine-prilocaine (EMLA)  cream Apply to affected area once (Patient taking differently: Apply 1 application topically once as needed (prior to port access).) 30 g 3 1 1/2 weeks ago   ondansetron (ZOFRAN) 8 MG tablet TAKE 1 TABLET (8 MG TOTAL) BY MOUTH 2 (TWO) TIMES DAILY AS NEEDED. START ON THE THIRD DAY AFTER CHEMOTHERAPY. 30 tablet 1 02/22/2021 at am   potassium chloride SA (KLOR-CON) 20 MEQ tablet Take 1 tablet (20 mEq total) by mouth daily. 30 tablet 3 02/22/2021 at am   prochlorperazine (COMPAZINE) 10 MG tablet Take 1 tablet (10 mg total) by mouth every 6 (six) hours as needed (Nausea or vomiting). 30 tablet 3 02/20/2021   [DISCONTINUED] oxyCODONE (OXY IR/ROXICODONE) 5 MG immediate release tablet Take 5 mg by mouth 4 (four) times daily as needed (pain).   week ago   Scheduled:   apixaban  10 mg Oral BID   Followed by   Derrill Memo ON 03/03/2021] apixaban  5 mg Oral BID   chlorthalidone  25 mg Oral q morning   DULoxetine  30 mg Oral Q breakfast   potassium chloride  40 mEq Oral Q4H   senna-docusate  1 tablet Oral BID  Infusions:   heparin 1,550 Units/hr (02/24/21 0427)   PRN: acetaminophen **OR** acetaminophen, ALPRAZolam, bisacodyl, clonazePAM, fentaNYL (SUBLIMAZE) injection, ondansetron **OR** ondansetron (ZOFRAN) IV, oxyCODONE, polyethylene glycol, prochlorperazine  Assessment: 55 YO F with a past medical history of breast cancer currently treated with Taxol (last dose date unknown) presented to the ER with neck pain and swelling. 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  Heparin drip was started on admission for VTE treatment and discontinued at 02/24/2021 @ 11:00 AM. Transitioned to Eliquis at 02/24/2021 @ 11:00 AM for VTE treatment. Patient not on any anticoagulant medications PTA.  Plan:  - Start Eliquis 10 mg BID x 7 days, then Eliquis 5 mg BID thereafter - Monitor for any signs and symptoms of bleeding - Educated patient and provided  patient with free 30 day coupon   Darcel Smalling, Student Pharmacist 02/24/2021,10:44 AM

## 2021-02-24 NOTE — Plan of Care (Signed)
Discharge instructions reviewed with patient, questions answered, verbalized understanding.  Patient spoke with pharmacy regarding Eliquis prior to discharge as well.  Transported to  main entrance of hospital via wheelchair to await ride from friend.

## 2021-02-24 NOTE — Discharge Summary (Signed)
Physician Discharge Summary  Lydia Collins RUE:[id number] DOB: Jan 24, 1966 DOA: 02/22/2021  PCP: Patient, [name]  Admit date: 02/22/2021 Discharge date: 02/24/2021  Admitted From: Home Disposition: Home  Recommendations for Outpatient Follow-up:  Follow up with PCP in 1 week with repeat CBC/BMP Outpatient follow-up with oncology/Dr. Lindi Adie Follow up in ED if symptoms worsen or new appear   Home Health: No Equipment/Devices: None  Discharge Condition: Stable CODE STATUS: Full Diet recommendation: Heart healthy  Brief/Interim Summary: 55 year old female with history of breast cancer on chemotherapy, hypertension, anxiety presented with right neck pain and swelling with headache.  On presentation, she was found to have right IJ thrombosis possibly associated with right IJ Port-A-Cath.  She was started on heparin drip.  During the hospitalization, she has remained stable.  She will be switched to Eliquis today.  She will be discharged home today.  Currently on room air.  Outpatient follow-up with oncology.  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  Spoke with Dr. Shadad/oncology on phone on 02/23/2021 who recommended to continue heparin drip for at least 24 hours and possibly switch to DOAC afterwards.   -She will be switched to Eliquis today.  She will be discharged home today.  Currently on room air.  Outpatient follow-up with oncology. -No need for emergent removal of Port-A-Cath as per oncology.  General surgery is also of the same opinion.   Thrombocytopenia -No signs of bleeding.  Resolved.   Breast cancer -Currently undergoing chemotherapy as per Dr. Lindi Adie.  Outpatient follow-up with oncology.   Anxiety -Continue duloxetine and as needed Xanax  Hypokalemia -Replace prior to discharge.  Continue replacement as an outpatient.  Outpatient  follow-up.     Discharge Instructions  Discharge Instructions     Ambulatory referral to Hematology / Oncology   Complete by: As directed    Diet - low sodium heart healthy   Complete by: As directed    Increase activity slowly   Complete by: As directed       Allergies as of 02/24/2021       Reactions   Ibuprofen Other (See Comments)   Stomach burns   Naproxen Other (See Comments)   Stomach burns        Medication List     STOP taking these medications    ALPRAZolam 0.5 MG tablet Commonly known as: XANAX       TAKE these medications    acetaminophen 500 MG tablet Commonly known as: TYLENOL Take 1,000-1,500 mg by mouth every 6 (six) hours as needed for fever (pain).   Apixaban Starter Pack (10mg  and 5mg ) Commonly known as: ELIQUIS STARTER PACK Take as directed on package: start with two-5mg  tablets twice daily for 7 days. On day 8, switch to one-5mg  tablet twice daily.   cetirizine 10 MG tablet Commonly known as: ZYRTEC Take 10 mg by mouth every morning.   chlorthalidone 25 MG tablet Commonly known as: HYGROTON Take 25 mg by mouth every morning.   clonazePAM 0.5 MG tablet Commonly known as: KLONOPIN Take 0.5 mg by mouth at bedtime as needed for anxiety (sleep).   dexamethasone 4 MG tablet Commonly known as: DECADRON Take 1 tablet (4 mg total) by mouth daily. Take 1 tablet day after chemo and 1 tablet 2 days after chemo with food What changed:  when to take this additional instructions   DULoxetine 30 MG capsule Commonly  known as: CYMBALTA Take 30 mg by mouth daily with breakfast.   ondansetron 8 MG tablet Commonly known as: ZOFRAN TAKE 1 TABLET (8 MG TOTAL) BY MOUTH 2 (TWO) TIMES DAILY AS NEEDED. START ON THE THIRD DAY AFTER CHEMOTHERAPY. What changed:  when to take this reasons to take this additional instructions   oxyCODONE 5 MG immediate release tablet Commonly known as: Oxy IR/ROXICODONE Take 1 tablet (5 mg total) by mouth every 6  (six) hours as needed for severe pain (pain). What changed:  when to take this reasons to take this   polyethylene glycol 17 g packet Commonly known as: MIRALAX / GLYCOLAX Take 17 g by mouth daily as needed for moderate constipation.   potassium chloride SA 20 MEQ tablet Commonly known as: KLOR-CON Take 1 tablet (20 mEq total) by mouth daily. What changed: when to take this   prochlorperazine 10 MG tablet Commonly known as: COMPAZINE Take 1 tablet (10 mg total) by mouth every 6 (six) hours as needed (Nausea or vomiting).   senna-docusate 8.6-50 MG tablet Commonly known as: Senokot-S Take 1 tablet by mouth 2 (two) times daily.       ASK your doctor about these medications    lidocaine-prilocaine cream Commonly known as: EMLA Apply to affected area once        Follow-up Information     [facility] Follow up.   Why: Keep scheduled appointment        PCP. Schedule an appointment as soon as possible for a visit in 1 week(s).                 Allergies  Allergen Reactions   Ibuprofen Other (See Comments)    Stomach burns   Naproxen Other (See Comments)    Stomach burns    Consultations: Phone consultation with Dr. Shadad/oncology   Procedures/Studies: CT Head Wo Contrast  Result Date: 02/22/2021 CLINICAL DATA:  Headache. Patient reports right-sided neck pain and swelling. Breast cancer with active chemotherapy EXAM: CT HEAD WITHOUT CONTRAST TECHNIQUE: Contiguous axial images were obtained from the base of the skull through the vertex without intravenous contrast. COMPARISON:  Head CT 10/23/2015 FINDINGS: Brain: No intracranial hemorrhage, mass effect, or midline shift. No hydrocephalus. The basilar cisterns are patent. No evidence of territorial infarct or acute ischemia. No extra-axial or intracranial fluid collection. Vascular: No hyperdense vessel. Skull: Normal. Negative for fracture or focal lesion. Sinuses/Orbits: Paranasal sinuses and mastoid air cells  are clear. The visualized orbits are unremarkable. Other: None IMPRESSION: Negative noncontrast head CT. Electronically Signed   By: Keith Rake M.D.   On: 02/22/2021 17:31   CT Soft Tissue Neck W Contrast  Result Date: 02/22/2021 CLINICAL DATA:  Soft tissue mass, neck, spontaneous hemorrhage. Right neck pain and swelling into the right chest. Patient is concerned about infection of her Port-A-Cath. EXAM: CT NECK WITH CONTRAST TECHNIQUE: Multidetector CT imaging of the neck was performed using the standard protocol following the bolus administration of intravenous contrast. CONTRAST:  41mL OMNIPAQUE IOHEXOL 300 MG/ML  SOLN COMPARISON:  None. FINDINGS: Pharynx and larynx: No focal mucosal lesions are present. Salivary glands: Submandibular and parotid glands and ducts are within limits. Thyroid: Normal. Lymph nodes: Normal sized reactive type lymph nodes are present bilaterally. Vascular: The right internal jugular vein is thrombosed from the angle the mandible to just above where the innominate vein enters. Thrombus surrounds the tunneled catheter. There is enhancement about the vein with some surrounding inflammatory change. Minimal atherosclerotic changes are present  on both carotid bifurcations. Atherosclerotic changes are present at the aortic arch. Limited intracranial: Within normal limits Visualized orbits: The globes and orbits are within normal limits. Mastoids and visualized paranasal sinuses: The paranasal sinuses and mastoid air cells are clear. Skeleton: Degenerative changes the cervical spine most pronounced at C5-6 with uncovertebral facet hypertrophy contributing to foraminal narrowing bilaterally. No focal lytic or blastic lesions are present. Upper chest: Lung apices are clear. Thoracic inlet is within normal limits. IMPRESSION: 1. 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 2. Thrombus surrounds the tunneled  catheter. 3. No other acute or focal lesion to explain the patient's symptoms. 4. Degenerative changes of the cervical spine are most pronounced at C5-6 with uncovertebral facet hypertrophy contributing to foraminal narrowing bilaterally. 5. Aortic Atherosclerosis (ICD10-I70.0). Critical Value/emergent results were called by telephone at the time of interpretation on 02/22/2021 at 5:55 pm to provider [facility] , who verbally acknowledged these results. Electronically Signed   By: San Morelle M.D.   On: 02/22/2021 17:55      Subjective: Patient seen and examined at bedside.  Feels okay to go home today.  Complains of right-sided neck pain.  No overnight fever or vomiting reported.  Discharge Exam: Vitals:   02/23/21 2030 02/24/21 0511  BP: 120/87 103/78  Pulse: 100 91  Resp: 15 18  Temp: 98.4 F (36.9 C) 98.7 F (37.1 C)  SpO2: 94% 93%    General: Pt is alert, awake, not in acute distress.  Looks chronically ill and older than stated age. Cardiovascular: rate controlled, S1/S2 + Respiratory: bilateral decreased breath sounds at bases Abdominal: Soft, NT, ND, bowel sounds + Extremities: no edema, no cyanosis    The results of significant diagnostics from this hospitalization (including imaging, microbiology, ancillary and laboratory) are listed below for reference.     Microbiology: Recent Results (from the past 240 hour(s))  Culture, blood (single)     Status: None (Preliminary result)   Collection Time: 02/22/21 12:44 PM   Specimen: BLOOD  Result Value Ref Range Status   Specimen Description   Final    BLOOD RIGHT ANTECUBITAL Performed at [facility] [address] [place], [place] [address]    Special Requests   Final    BOTTLES DRAWN AEROBIC AND ANAEROBIC Blood Culture adequate volume Performed at [facility] [address], [place], [place] [address]    Culture   Final    NO GROWTH 2 DAYS Performed at [facility] [address] [place], [place] [address]    Report Status PENDING  Incomplete  SARS CORONAVIRUS 2 (TAT 6-24 HRS) Nasopharyngeal Nasopharyngeal Swab     Status: None   Collection Time: 02/22/21  7:02 PM   Specimen: Nasopharyngeal Swab  Result Value Ref Range Status   SARS Coronavirus 2 NEGATIVE NEGATIVE Final    Comment: (NOTE) SARS-CoV-2 target nucleic acids are NOT DETECTED.  The SARS-CoV-2 RNA is generally detectable in upper and lower respiratory specimens during the acute phase of infection. Negative results do not preclude SARS-CoV-2 infection, do not rule out co-infections with other pathogens, and should not be used as the sole basis for treatment or other patient management decisions. Negative results must be combined with clinical observations, patient history, and epidemiological information. The expected result is Negative.  Fact Sheet for Patients: SugarRoll.be  Fact Sheet for Healthcare Providers: https://www.woods-mathews.com/  This test is not yet  approved or cleared by the [place] and  has been authorized for detection and/or diagnosis of SARS-CoV-2 by FDA under an Emergency Use Authorization (EUA). This EUA will remain  in effect (meaning this test can be used) for the duration of the COVID-19 declaration under Se ction 564(b)(1) of the Act, 21 U.S.C. section 360bbb-3(b)(1), unless the authorization is terminated or revoked sooner.  Performed at [facility], [place] [address], [place], [place] [address]      Labs: BNP (last 3 results) No results for input(s): BNP in the last 8760 hours. Basic Metabolic Panel: Recent Labs  Lab 02/22/21 1243 02/23/21 0132 02/24/21 0428  NA 136 138 139  K 3.0* 3.5 3.0*  CL 99 103 108  CO2 25 26 24   GLUCOSE 108* 99 104*  BUN 13 12 12   CREATININE 0.82 0.72 0.76  CALCIUM 9.1 8.6* 8.6*  MG  --   --  1.9   Liver Function Tests: Recent Labs  Lab  02/22/21 1243 02/24/21 0428  AST 14* 10*  ALT 12 9  ALKPHOS 98 88  BILITOT 0.2* 0.3  PROT 7.3 6.1*  ALBUMIN 3.8 3.1*   No results for input(s): LIPASE, AMYLASE in the last 168 hours. No results for input(s): AMMONIA in the last 168 hours. CBC: Recent Labs  Lab 02/22/21 1243 02/23/21 0132 02/24/21 0428  WBC 8.4 8.7 10.2  NEUTROABS 5.8  --   --   HGB 11.2* 10.1* 9.9*  HCT 33.5* 30.6* 30.3*  MCV 89.3 91.6 93.5  PLT 127* 127* 177   Cardiac Enzymes: No results for input(s): CKTOTAL, CKMB, CKMBINDEX, TROPONINI in the last 168 hours. BNP: Invalid input(s): POCBNP CBG: No results for input(s): GLUCAP in the last 168 hours. D-Dimer No results for input(s): DDIMER in the last 72 hours. Hgb A1c No results for input(s): HGBA1C in the last 72 hours. Lipid Profile No results for input(s): CHOL, HDL, LDLCALC, TRIG, CHOLHDL, LDLDIRECT in the last 72 hours. Thyroid function studies No results for input(s): TSH, T4TOTAL, T3FREE, THYROIDAB in the last 72 hours.  Invalid input(s): FREET3 Anemia work up No results for input(s): VITAMINB12, FOLATE, FERRITIN, TIBC, IRON, RETICCTPCT in the last 72 hours. Urinalysis    Component Value Date/Time   COLORURINE YELLOW 05/03/2012 2336   APPEARANCEUR CLEAR 05/03/2012 2336   LABSPEC 1.019 05/03/2012 2336   PHURINE 6.0 05/03/2012 2336   GLUCOSEU NEGATIVE 05/03/2012 2336   HGBUR NEGATIVE 05/03/2012 2336   BILIRUBINUR NEGATIVE 05/03/2012 2336   KETONESUR NEGATIVE 05/03/2012 2336   PROTEINUR NEGATIVE 05/03/2012 2336   UROBILINOGEN 0.2 05/03/2012 2336   NITRITE NEGATIVE 05/03/2012 2336   LEUKOCYTESUR NEGATIVE 05/03/2012 2336   Sepsis Labs Invalid input(s): PROCALCITONIN,  WBC,  LACTICIDVEN Microbiology Recent Results (from the past 240 hour(s))  Culture, blood (single)     Status: None (Preliminary result)   Collection Time: 02/22/21 12:44 PM   Specimen: BLOOD  Result Value Ref Range Status   Specimen Description   Final    BLOOD RIGHT  ANTECUBITAL Performed at [facility], [place] [address] [place], [place] [address]    Special Requests   Final    BOTTLES DRAWN AEROBIC AND ANAEROBIC Blood Culture adequate volume Performed at [facility] [address] [place], [place] [address]    Culture   Final    NO GROWTH 2 DAYS Performed at [facility] [address], [place], [place] [address]    Report Status PENDING  Incomplete  SARS CORONAVIRUS 2 (TAT 6-24 HRS) Nasopharyngeal Nasopharyngeal Swab     Status: None   Collection Time: 02/22/21  7:02 PM   Specimen: Nasopharyngeal Swab  Result Value Ref Range Status   SARS Coronavirus 2 NEGATIVE NEGATIVE Final    Comment: (NOTE) SARS-CoV-2 target nucleic acids are NOT DETECTED.  The SARS-CoV-2 RNA is generally detectable in upper and lower respiratory specimens during the acute phase of infection. Negative results do not preclude SARS-CoV-2 infection, do not rule out co-infections with other pathogens, and should not be used as the sole basis for treatment or other patient management decisions. Negative results must be combined with clinical observations, patient history, and epidemiological information. The expected result is Negative.  Fact Sheet for Patients: SugarRoll.be  Fact Sheet for Healthcare Providers: https://www.woods-mathews.com/  This test is not yet approved or cleared by the [place] FDA and  has been authorized for detection and/or diagnosis of SARS-CoV-2 by FDA under an Emergency Use Authorization (EUA). This EUA will remain  in effect (meaning this test can be used) for the duration of the COVID-19 declaration under Se ction 564(b)(1) of the Act, 21 U.S.C. section 360bbb-3(b)(1), unless the authorization is terminated or revoked sooner.  Performed at [facility], [place] [address] [place], [place] [address]      Time coordinating discharge: 35  minutes  SIGNED:   Aline August, MD  [facility] 02/24/2021, 10:41 AM

## 2021-02-24 NOTE — Telephone Encounter (Signed)
Updated appt time per 6/16 sch msg. Will have updated calendar printed for pt.

## 2021-02-24 NOTE — Discharge Instructions (Signed)
Information on my medicine - ELIQUIS (apixaban)   Why was Eliquis prescribed for you? Eliquis was prescribed to treat blood clots that may have been found in the veins of your legs (deep vein thrombosis) or in your lungs (pulmonary embolism) and to reduce the risk of them occurring again.  What do You need to know about Eliquis ? The starting dose is 10 mg (two 5 mg tablets) taken TWICE daily for the FIRST SEVEN (7) DAYS, then on 03/03/21  the dose is reduced to ONE 5 mg tablet taken TWICE daily.  Eliquis may be taken with or without food.   Try to take the dose about the same time in the morning and in the evening. If you have difficulty swallowing the tablet whole please discuss with your pharmacist how to take the medication safely.  Take Eliquis exactly as prescribed and DO NOT stop taking Eliquis without talking to the doctor who prescribed the medication.  Stopping may increase your risk of developing a new blood clot.  Refill your prescription before you run out.  After discharge, you should have regular check-up appointments with your healthcare provider that is prescribing your Eliquis.    What do you do if you miss a dose? If a dose of ELIQUIS is not taken at the scheduled time, take it as soon as possible on the same day and twice-daily administration should be resumed. The dose should not be doubled to make up for a missed dose.  Important Safety Information A possible side effect of Eliquis is bleeding. You should call your healthcare provider right away if you experience any of the following: Bleeding from an injury or your nose that does not stop. Unusual colored urine (red or dark brown) or unusual colored stools (red or black). Unusual bruising for unknown reasons. A serious fall or if you hit your head (even if there is no bleeding).  Some medicines may interact with Eliquis and might increase your risk of bleeding or clotting while on Eliquis. To help avoid  this, consult your healthcare provider or pharmacist prior to using any new prescription or non-prescription medications, including herbals, vitamins, non-steroidal anti-inflammatory drugs (NSAIDs) and supplements.  This website has more information on Eliquis (apixaban): http://www.eliquis.com/eliquis/home

## 2021-02-24 NOTE — Progress Notes (Addendum)
ANTICOAGULATION CONSULT NOTE - Follow Up Consult  Pharmacy Consult for IV Heparin Indication: VTE  Allergies  Allergen Reactions   Ibuprofen Other (See Comments)    Stomach burns   Naproxen Other (See Comments)    Stomach burns    Patient Measurements: Height: 5\' 4"  (162.6 cm) Weight: 71.4 kg (157 lb 6.4 oz) IBW/kg (Calculated) : 54.7 Heparin Dosing Weight: 69.3 kg  Vital Signs: Temp: 98.7 F (37.1 C) (06/20 0511) Temp Source: Oral (06/20 0511) BP: 103/78 (06/20 0511) Pulse Rate: 91 (06/20 0511)  Labs: Recent Labs    02/22/21 1243 02/22/21 1243 02/23/21 0132 02/23/21 0847 02/23/21 1828 02/24/21 0428  HGB 11.2*  --  10.1*  --   --  9.9*  HCT 33.5*  --  30.6*  --   --  30.3*  PLT 127*  --  127*  --   --  177  HEPARINUNFRC  --    < > 0.18* 0.27* 0.54 0.61  CREATININE 0.82  --  0.72  --   --  0.76   < > = values in this interval not displayed.    Estimated Creatinine Clearance: 79.7 mL/min (by C-G formula based on SCr of 0.76 mg/dL).   Medications:  Medications Prior to Admission  Medication Sig Dispense Refill Last Dose   acetaminophen (TYLENOL) 500 MG tablet Take 1,000-1,500 mg by mouth every 6 (six) hours as needed for fever (pain).   02/22/2021 at am   ALPRAZolam (XANAX) 0.5 MG tablet Take 0.5 mg by mouth at bedtime as needed for anxiety or sleep.   02/20/2021 at pm   cetirizine (ZYRTEC) 10 MG tablet Take 10 mg by mouth every morning.   02/21/2021 at am   chlorthalidone (HYGROTON) 25 MG tablet Take 25 mg by mouth every morning.   02/21/2021 at am   clonazePAM (KLONOPIN) 0.5 MG tablet Take 0.5 mg by mouth at bedtime as needed for anxiety (sleep).   few weeks ago   dexamethasone (DECADRON) 4 MG tablet Take 1 tablet (4 mg total) by mouth daily. Take 1 tablet day after chemo and 1 tablet 2 days after chemo with food (Patient taking differently: Take 4 mg by mouth See admin instructions. Take one tablet (4 mg) by mouth daily for 4 days starting the day after chemo - take  with food) 8 tablet 0 week ago   DULoxetine (CYMBALTA) 30 MG capsule Take 30 mg by mouth daily with breakfast.   few days ago   lidocaine-prilocaine (EMLA) cream Apply to affected area once (Patient taking differently: Apply 1 application topically once as needed (prior to port access).) 30 g 3 1 1/2 weeks ago   ondansetron (ZOFRAN) 8 MG tablet TAKE 1 TABLET (8 MG TOTAL) BY MOUTH 2 (TWO) TIMES DAILY AS NEEDED. START ON THE THIRD DAY AFTER CHEMOTHERAPY. (Patient taking differently: Take 8 mg by mouth every 12 (twelve) hours as needed for nausea or vomiting.) 30 tablet 1 02/22/2021 at am   oxyCODONE (OXY IR/ROXICODONE) 5 MG immediate release tablet Take 5 mg by mouth 4 (four) times daily as needed (pain).   week ago   potassium chloride SA (KLOR-CON) 20 MEQ tablet Take 1 tablet (20 mEq total) by mouth daily. (Patient taking differently: Take 20 mEq by mouth every morning.) 30 tablet 3 02/22/2021 at am   prochlorperazine (COMPAZINE) 10 MG tablet Take 1 tablet (10 mg total) by mouth every 6 (six) hours as needed (Nausea or vomiting). 30 tablet 3 02/20/2021   Scheduled:  chlorthalidone  25 mg Oral q morning   DULoxetine  30 mg Oral Q breakfast   potassium chloride  40 mEq Oral Q4H   senna-docusate  1 tablet Oral BID   Infusions:   heparin 1,550 Units/hr (02/24/21 0427)   PRN: acetaminophen **OR** acetaminophen, ALPRAZolam, bisacodyl, clonazePAM, fentaNYL (SUBLIMAZE) injection, ondansetron **OR** ondansetron (ZOFRAN) IV, oxyCODONE, polyethylene glycol, prochlorperazine  Assessment: 55 YO F with a past medical history of breast cancer currently treated with Taxol (last dose date unknown) presented to the ER with neck pain and swelling. 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  Heparin drip was started on admission for VTE treatment. Patient not on any anticoagulant medications PTA.   02/24/2021 Heparin level therapeutic (0.61)  on IV heparin infusing at 1550 units/hr Hgb 9.9, Plts 177 No signs of bleeding, no infusion related issues reported from nurse  Goal of Therapy:  Heparin level 0.3-0.7 units/ml Monitor platelets by anticoagulation protocol: Yes   Plan:  Continue IV Heparin drip at 1550 units/hr Follow up CBC and HL with morning labs Monitor for signs/symptoms of bleeding Follow up on transition to oral anticoagulation when appropriate   Darcel Smalling, Student Pharmacist 02/24/2021,8:11 AM  Joanny Dupree P. Legrand Como, PharmD, [place] Please utilize Amion for appropriate phone number to reach the unit pharmacist ([place]) 02/24/2021 9:09 AM

## 2021-02-27 LAB — CULTURE, BLOOD (SINGLE)
Culture: NO GROWTH
Special Requests: ADEQUATE

## 2021-02-28 ENCOUNTER — Other Ambulatory Visit: Payer: Self-pay

## 2021-02-28 ENCOUNTER — Inpatient Hospital Stay (HOSPITAL_BASED_OUTPATIENT_CLINIC_OR_DEPARTMENT_OTHER): Payer: Medicaid Other | Admitting: Adult Health

## 2021-02-28 ENCOUNTER — Telehealth: Payer: Self-pay | Admitting: Adult Health

## 2021-02-28 ENCOUNTER — Inpatient Hospital Stay: Payer: Medicaid Other | Admitting: Dietician

## 2021-02-28 ENCOUNTER — Inpatient Hospital Stay: Payer: Medicaid Other

## 2021-02-28 VITALS — BP 134/91 | HR 149 | Temp 98.0°F | Resp 16 | Ht 64.0 in | Wt 155.9 lb

## 2021-02-28 DIAGNOSIS — C773 Secondary and unspecified malignant neoplasm of axilla and upper limb lymph nodes: Secondary | ICD-10-CM | POA: Diagnosis not present

## 2021-02-28 DIAGNOSIS — N6321 Unspecified lump in the left breast, upper outer quadrant: Secondary | ICD-10-CM | POA: Diagnosis not present

## 2021-02-28 DIAGNOSIS — Z5189 Encounter for other specified aftercare: Secondary | ICD-10-CM | POA: Diagnosis not present

## 2021-02-28 DIAGNOSIS — Z886 Allergy status to analgesic agent status: Secondary | ICD-10-CM | POA: Diagnosis not present

## 2021-02-28 DIAGNOSIS — C50512 Malignant neoplasm of lower-outer quadrant of left female breast: Secondary | ICD-10-CM

## 2021-02-28 DIAGNOSIS — I82C11 Acute embolism and thrombosis of right internal jugular vein: Secondary | ICD-10-CM | POA: Diagnosis not present

## 2021-02-28 DIAGNOSIS — Z87891 Personal history of nicotine dependence: Secondary | ICD-10-CM | POA: Diagnosis not present

## 2021-02-28 DIAGNOSIS — F419 Anxiety disorder, unspecified: Secondary | ICD-10-CM | POA: Diagnosis not present

## 2021-02-28 DIAGNOSIS — F17210 Nicotine dependence, cigarettes, uncomplicated: Secondary | ICD-10-CM | POA: Diagnosis not present

## 2021-02-28 DIAGNOSIS — Z170 Estrogen receptor positive status [ER+]: Secondary | ICD-10-CM

## 2021-02-28 DIAGNOSIS — Z5111 Encounter for antineoplastic chemotherapy: Secondary | ICD-10-CM | POA: Diagnosis present

## 2021-02-28 LAB — CBC WITH DIFFERENTIAL (CANCER CENTER ONLY)
Abs Immature Granulocytes: 0.36 10*3/uL — ABNORMAL HIGH (ref 0.00–0.07)
Basophils Absolute: 0.1 10*3/uL (ref 0.0–0.1)
Basophils Relative: 1 %
Eosinophils Absolute: 0 10*3/uL (ref 0.0–0.5)
Eosinophils Relative: 0 %
HCT: 37.7 % (ref 36.0–46.0)
Hemoglobin: 12.7 g/dL (ref 12.0–15.0)
Immature Granulocytes: 3 %
Lymphocytes Relative: 12 %
Lymphs Abs: 1.5 10*3/uL (ref 0.7–4.0)
MCH: 30.5 pg (ref 26.0–34.0)
MCHC: 33.7 g/dL (ref 30.0–36.0)
MCV: 90.6 fL (ref 80.0–100.0)
Monocytes Absolute: 0.9 10*3/uL (ref 0.1–1.0)
Monocytes Relative: 7 %
Neutro Abs: 10.2 10*3/uL — ABNORMAL HIGH (ref 1.7–7.7)
Neutrophils Relative %: 77 %
Platelet Count: 503 10*3/uL — ABNORMAL HIGH (ref 150–400)
RBC: 4.16 MIL/uL (ref 3.87–5.11)
RDW: 16.5 % — ABNORMAL HIGH (ref 11.5–15.5)
WBC Count: 13.1 10*3/uL — ABNORMAL HIGH (ref 4.0–10.5)
nRBC: 0.2 % (ref 0.0–0.2)

## 2021-02-28 LAB — CMP (CANCER CENTER ONLY)
ALT: 6 U/L (ref 0–44)
AST: 11 U/L — ABNORMAL LOW (ref 15–41)
Albumin: 3.5 g/dL (ref 3.5–5.0)
Alkaline Phosphatase: 111 U/L (ref 38–126)
Anion gap: 11 (ref 5–15)
BUN: 10 mg/dL (ref 6–20)
CO2: 25 mmol/L (ref 22–32)
Calcium: 9.9 mg/dL (ref 8.9–10.3)
Chloride: 105 mmol/L (ref 98–111)
Creatinine: 0.73 mg/dL (ref 0.44–1.00)
GFR, Estimated: >60 mL/min (ref >60)
Glucose, Bld: 85 mg/dL (ref 70–99)
Potassium: 3.8 mmol/L (ref 3.5–5.1)
Sodium: 141 mmol/L (ref 135–145)
Total Bilirubin: <0.2 mg/dL — ABNORMAL LOW (ref 0.3–1.2)
Total Protein: 7.7 g/dL (ref 6.5–8.1)

## 2021-02-28 LAB — MAGNESIUM: Magnesium: 1.9 mg/dL (ref 1.7–2.4)

## 2021-02-28 MED ORDER — SODIUM CHLORIDE 0.9 % IV SOLN
Freq: Once | INTRAVENOUS | Status: AC
  Filled 2021-02-28: qty 250

## 2021-02-28 MED ORDER — OXYCODONE HCL 5 MG PO TABS: 5.0000 mg | ORAL_TABLET | Freq: Four times a day (QID) | ORAL | 0 refills | Status: DC | PRN

## 2021-02-28 NOTE — Telephone Encounter (Signed)
Scheduled appts per 6/24 sch msg. Called pt, no answer and vm was full. Will try to call pt again later today.
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
## 2021-02-28 NOTE — Progress Notes (Signed)
Nutrition Assessment   Reason for Assessment: MST   ASSESSMENT: 55 year old female with ER+ left breast cancer. She is receiving adjuvant chemotherapy with Adriamycin and Cytoxan.   6/18-6/20 - hospital admission with right IJ thrombosis  Met with patient in exam room. She reports poor appetite, says nothing sounds good and foods don't taste like they use to. Patient denies nausea, vomiting, diarrhea, reports regular daily bowel movements. She is taking Miralax. Patient reports trying to eat a banana every morning to keep her potassium up. Yesterday she had banana, a couple pieces of liver, small heat/serve tortellini meal, and a cup of chocolate pudding. Patient reports her husband is a [employer], he is gone during the day but prepares dinner. Her favorite are his meatballs with gravy. Patient reports he brings home deli sandwiches from work for her to eat during the day. Patient is not drinking nutrition supplements currently, says they are too expensive. She used to drink Boost and liked them.     Medications: Eliquis, Klonopin, Decadron, Zofran, Oxycodone, Klor-con, Compazine   Labs: 6/20 K 3, Glucose 104   Anthropometrics: Weight 155 lb 14.4 oz today decreased 12 lbs (7%) from 168 lb in 5 weeks. This is significant  Height: 5'4" Weight: 70.7 kg UBW: 175-180 lb (per pt) BMI: 27.02   NUTRITION DIAGNOSIS: Unintentional weight loss related to cancer and associated treatments as evidenced by 19 lb (10.9%) decrease from usual body weight in 6 months and 12 lb (7%) weight loss in 5 weeks.    INTERVENTION:  Educated on importance of adequate intake of calories and protein to maintain weights, strength, and nutrition  Discussed strategies for poor appetite (encouraged small frequent meals and snacks, setting "meal time" alarms on phone, having variety of easy prepared meals/snacks available) handout provided Ideas for high calorie, high protein snacks - handout given Discussed  strategies for altered/diminished taste of foods - handout provided High calorie recipes given Recommend drinking 2 Ensure Plus/equivalent daily (350 kcal, 16 g protein) Complimentary Ensure Plus case provided today Contact information provided   MONITORING, EVALUATION, GOAL: Patient will tolerate adequate calories and protein to minimize weight loss   Next Visit: Friday July 15 in infusion

## 2021-02-28 NOTE — Assessment & Plan Note (Signed)
09/13/2020:Left lumpectomy ([place]): Grade 1 invasive lobular carcinoma, 1.5cm, 2/3 left axillary lymph nodes positive for metastatic carcinoma. ER/PR 95%, Ki-67 5%, HER2 negative 1+ by IHC positive lateral margin  Treatment plan: 1. lateral margin: Benign, AXLND: 25/26 LN Positive 2. Adj chemo with DD AC foll by taxol. 3. Adjuvant radiation therapy 4. Follow-up adjuvant antiestrogen therapy URCC nausea study CT CAP: 11/26/2020: Degenerative changes no metastatic disease Bone scan: 11/26/2020: Emphysema, no evidence of metastatic disease URCCnausea studyand [facility] blood draw study ------------------------------------------------------------------------------------------------------------------------ Current treatment:completed 4 cycles of Dose dense Adriamycin and Cytoxan Taxol to start 7/6 Echocardiogram3/29/2022: EF 50 to 55% low normal function   Lydia Collins is here today for follow-up and evaluation of her right IJ catheter associated VTE.  She is conitnuing on the Eliquis with good tolerance and I recommended she continue this.  I gave her Oxycodone #40 to help with her pain related to this.  We reviewed goals of management which include reducing the pain, maintaining functionality, and limiting adverse effects.  The expectation is that as the body naturally reabsorbs the clot the longer she is on Eliquis, the less she will need pain management.    Eowyn is dehydrated.  Her EKG shows sinus tachycardia.  She has dry mucous membranes.  She will receive one liter of IV fluids today.  I recommended she stop the chlorthalidone for the time being.    Zamariah will return next week for f/u of her dehydration.  She knows to call for any questions that may arise between now and her next appointment.  We are happy to see her sooner if needed.  

## 2021-03-03 ENCOUNTER — Other Ambulatory Visit: Payer: Medicaid Other

## 2021-03-03 ENCOUNTER — Ambulatory Visit: Payer: Medicaid Other | Admitting: Adult Health

## 2021-03-03 ENCOUNTER — Telehealth: Payer: Self-pay
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
## 2021-03-03 NOTE — Telephone Encounter (Signed)
Lydia Collins states that she is doing much better.She is drinking at least 64 oz of fluid a day. Eating 3 small meals with a bigger meal at dinner. She is drinking ensure supplements as directed. She is using tylenol alternating with the oxycodone 5 mg tabs for the right chest pain.  She states that the right chest is sore but tolerable with medication. She is taking the Eliquis 5 mg bid beginning today as directed. She will call prior to her appointment 03-07-21 with Dr. Lindi Adie if she has any questions or concerns.

## 2021-03-06 NOTE — Assessment & Plan Note (Deleted)
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 ER/PR 95%, Ki-67 5%, HER2 negative 1+ by IHC positive lateral margin  Treatment plan: 1. lateral margin: Benign, AXLND: 25/26 LN Positive 2. Adj chemo with DD AC foll by taxol. 3. Adjuvant radiation therapy 4. 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

## 2021-03-07 ENCOUNTER — Inpatient Hospital Stay: Payer: Medicaid Other | Admitting: Hematology and Oncology

## 2021-03-07 ENCOUNTER — Telehealth: Payer: Self-pay

## 2021-03-07 ENCOUNTER — Inpatient Hospital Stay: Payer: Medicaid Other | Attending: Hematology and Oncology

## 2021-03-07 ENCOUNTER — Inpatient Hospital Stay: Payer: Medicaid Other

## 2021-03-07 DIAGNOSIS — C50512 Malignant neoplasm of lower-outer quadrant of left female breast: Secondary | ICD-10-CM | POA: Insufficient documentation

## 2021-03-07 DIAGNOSIS — Z170 Estrogen receptor positive status [ER+]: Secondary | ICD-10-CM | POA: Insufficient documentation

## 2021-03-07 MED FILL — Dexamethasone Sodium Phosphate Inj 100 MG/10ML: INTRAMUSCULAR | Qty: 1 | Status: AC

## 2021-03-07 NOTE — Telephone Encounter (Signed)
Spoke with patient regarding today's appointments. Patient reports that she meant to reschedule appointments and would reach out to our scheduling department to do so.  Charge RN made aware and patient's appointments canceled for today.

## 2021-03-11 ENCOUNTER — Encounter: Payer: Self-pay | Admitting: *Deleted

## 2021-03-12 ENCOUNTER — Telehealth: Payer: Self-pay | Admitting: Adult Health

## 2021-03-12 NOTE — Telephone Encounter (Signed)
R/s appts per 7/6 sch msg. Pt aware.  

## 2021-03-14 ENCOUNTER — Other Ambulatory Visit: Payer: Medicaid Other

## 2021-03-14 ENCOUNTER — Ambulatory Visit: Payer: Medicaid Other

## 2021-03-17 ENCOUNTER — Inpatient Hospital Stay: Payer: Medicaid Other

## 2021-03-17 ENCOUNTER — Other Ambulatory Visit: Payer: Self-pay

## 2021-03-17 ENCOUNTER — Telehealth: Payer: Self-pay | Admitting: *Deleted

## 2021-03-17 DIAGNOSIS — Z170 Estrogen receptor positive status [ER+]: Secondary | ICD-10-CM | POA: Diagnosis not present

## 2021-03-17 DIAGNOSIS — C50512 Malignant neoplasm of lower-outer quadrant of left female breast: Secondary | ICD-10-CM | POA: Diagnosis present

## 2021-03-17 LAB — CMP (CANCER CENTER ONLY)
ALT: 11 U/L (ref 0–44)
AST: 13 U/L — ABNORMAL LOW (ref 15–41)
Albumin: 3.6 g/dL (ref 3.5–5.0)
Alkaline Phosphatase: 87 U/L (ref 38–126)
Anion gap: 11 (ref 5–15)
BUN: 9 mg/dL (ref 6–20)
CO2: 30 mmol/L (ref 22–32)
Calcium: 10 mg/dL (ref 8.9–10.3)
Chloride: 98 mmol/L (ref 98–111)
Creatinine: 0.7 mg/dL (ref 0.44–1.00)
GFR, Estimated: >60 mL/min (ref >60)
Glucose, Bld: 111 mg/dL — ABNORMAL HIGH (ref 70–99)
Potassium: 3.2 mmol/L — ABNORMAL LOW (ref 3.5–5.1)
Sodium: 139 mmol/L (ref 135–145)
Total Bilirubin: 0.2 mg/dL — ABNORMAL LOW (ref 0.3–1.2)
Total Protein: 7.3 g/dL (ref 6.5–8.1)

## 2021-03-17 LAB — CBC WITH DIFFERENTIAL (CANCER CENTER ONLY)
Abs Immature Granulocytes: 0.02 10*3/uL (ref 0.00–0.07)
Basophils Absolute: 0 10*3/uL (ref 0.0–0.1)
Basophils Relative: 1 %
Eosinophils Absolute: 0.2 10*3/uL (ref 0.0–0.5)
Eosinophils Relative: 2 %
HCT: 34.1 % — ABNORMAL LOW (ref 36.0–46.0)
Hemoglobin: 11.3 g/dL — ABNORMAL LOW (ref 12.0–15.0)
Immature Granulocytes: 0 %
Lymphocytes Relative: 14 %
Lymphs Abs: 1.2 10*3/uL (ref 0.7–4.0)
MCH: 30.9 pg (ref 26.0–34.0)
MCHC: 33.1 g/dL (ref 30.0–36.0)
MCV: 93.2 fL (ref 80.0–100.0)
Monocytes Absolute: 0.5 10*3/uL (ref 0.1–1.0)
Monocytes Relative: 6 %
Neutro Abs: 6.6 10*3/uL (ref 1.7–7.7)
Neutrophils Relative %: 77 %
Platelet Count: 250 10*3/uL (ref 150–400)
RBC: 3.66 MIL/uL — ABNORMAL LOW (ref 3.87–5.11)
RDW: 15.4 % (ref 11.5–15.5)
WBC Count: 8.6 10*3/uL (ref 4.0–10.5)
nRBC: 0 % (ref 0.0–0.2)

## 2021-03-17 MED ORDER — SODIUM CHLORIDE 0.9% FLUSH
10.0000 mL | Freq: Once | INTRAVENOUS | Status: DC
  Filled 2021-03-17: qty 10

## 2021-03-17 NOTE — Telephone Encounter (Signed)
Lydia Collins left a message requesting medication for her anxiety- states starting chemo on Thursday has her very anxious. " I just need a few of my Xanax or Klonopin" . Has an appt to see Wilber Bihari, NP prior to chemo

## 2021-03-18 ENCOUNTER — Telehealth: Payer: Self-pay | Admitting: *Deleted

## 2021-03-18 ENCOUNTER — Encounter: Payer: Self-pay | Admitting: Hematology and Oncology

## 2021-03-18 NOTE — Telephone Encounter (Signed)
Lydia Collins left a message stating we need to contact [place] to let them know she is a patient here at [facility] and will need to ride on the bus.  RN called and left message for the transportation services with her appt times here on Friday 7/15. Provided our contact info if they need further information

## 2021-03-19 ENCOUNTER — Telehealth: Payer: Self-pay | Admitting: *Deleted

## 2021-03-19 NOTE — Telephone Encounter (Signed)
Pt called to asked if it is ok for to restart Cymbalta with the Eliquis.  This RN inquired about above - including if she was advised to stop the Cymbalta- she states " no I just stopped it myself "  Per pt's med list noted as currently on.  She states she stopped the Cymbalta approximately 6 months ago " but now my anxiety is terrible and I need to be back on it "  Per above- pt states she " has plenty of the cymbalta " which has been prescribed by her primary MD- Jeanella Anton ([place]).  Per discussion- pt will resume the Cymbalta at this time- of note she mentioned needing klonopin " it helps me sleep but I can talk with Mendel Ryder when I see her Friday "  Note pt is scheduled for visit 7/15 prior to 1st Taxol treatment.  This message will be forwarded to provider for review and any further recommendations

## 2021-03-20 MED FILL — Dexamethasone Sodium Phosphate Inj 100 MG/10ML: INTRAMUSCULAR | Qty: 1 | Status: AC
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
## 2021-03-20 NOTE — Assessment & Plan Note (Signed)
09/13/2020:Left lumpectomy ([place]): Grade 1 invasive lobular carcinoma, 1.5cm, 2/3 left axillary lymph nodes positive for metastatic carcinoma. ER/PR 95%, Ki-67 5%, HER2 negative 1+ by IHC positive lateral margin  Treatment plan: 1. lateral margin: Benign, AXLND: 25/26 LN Positive 2. Adj chemo with DD AC foll by taxol. 3. Adjuvant radiation therapy 4. Follow-up adjuvant antiestrogen therapy URCC nausea study CT CAP: 11/26/2020: Degenerative changes no metastatic disease Bone scan: 11/26/2020: Emphysema, no evidence of metastatic disease URCCnausea studyand [facility] blood draw study ------------------------------------------------------------------------------------------------------------------------ Current treatment:completed 4 cycles of Dose dense Adriamycin and Cytoxan Taxol to start 7/6 Echocardiogram3/29/2022: EF 50 to 55% low normal function   Lydia Collins is here today for follow-up and evaluation of her right IJ catheter associated VTE.  She is conitnuing on the Eliquis with good tolerance and I recommended she continue this.  I gave her Oxycodone #40 to help with her pain related to this.  We reviewed goals of management which include reducing the pain, maintaining functionality, and limiting adverse effects.  The expectation is that as the body naturally reabsorbs the clot the longer she is on Eliquis, the less she will need pain management.    Lydia Collins is dehydrated.  Her EKG shows sinus tachycardia.  She has dry mucous membranes.  She will receive one liter of IV fluids today.  I recommended she stop the chlorthalidone for the time being.    Lydia Collins will return next week for f/u of her dehydration.  She knows to call for any questions that may arise between now and her next appointment.  We are happy to see her sooner if needed.  

## 2021-03-21 ENCOUNTER — Encounter: Payer: Self-pay | Admitting: *Deleted

## 2021-03-21 ENCOUNTER — Telehealth: Payer: Self-pay | Admitting: Hematology and Oncology

## 2021-03-21 ENCOUNTER — Inpatient Hospital Stay: Payer: Medicaid Other

## 2021-03-21 ENCOUNTER — Inpatient Hospital Stay (HOSPITAL_BASED_OUTPATIENT_CLINIC_OR_DEPARTMENT_OTHER): Payer: Medicaid Other | Admitting: Adult Health

## 2021-03-21 ENCOUNTER — Other Ambulatory Visit: Payer: Self-pay | Admitting: *Deleted

## 2021-03-21 ENCOUNTER — Inpatient Hospital Stay: Payer: Medicaid Other | Admitting: Dietician

## 2021-03-21 ENCOUNTER — Other Ambulatory Visit: Payer: Self-pay

## 2021-03-21 DIAGNOSIS — C50512 Malignant neoplasm of lower-outer quadrant of left female breast: Secondary | ICD-10-CM

## 2021-03-21 DIAGNOSIS — Z170 Estrogen receptor positive status [ER+]: Secondary | ICD-10-CM

## 2021-03-21 NOTE — Telephone Encounter (Signed)
Scheduled appointment per 07/15 sch msg. Patient is aware. 

## 2021-03-21 NOTE — Progress Notes (Signed)
Lydia Collins came for her scheduled appointment and then mistakenly left after hearing her provider wasn't in the office today and presumed her appointments were canceled.   Earlier this week, her Taxol was canceled by nursing due to her having a blood clot in her port.    I clarified with nursing that we can still use the port, as the patient is symptomatically improving.  If the patient is opposed to using the port, the taxol can be given peripherally.    I also informed the nurse navigators of the frequently missed appointments.  Will add a consult to social work for f/u.    I will discuss with Val RN, and we will work to get patient in for early next week.   Wilber Bihari, NP

## 2021-03-25 ENCOUNTER — Telehealth: Payer: Self-pay | Admitting: Licensed Clinical Social Worker

## 2021-03-25 NOTE — Telephone Encounter (Signed)
[facility] Work  Clinical Social Work was referred by NP for assessment of psychosocial needs.  Clinical Social Worker  attempted to contact patient by phone   to offer support and assess for needs.    No answer. Left VM with brief introduction and direct contact information.    [place], [place] Worker [employer]

## 2021-03-26 ENCOUNTER — Other Ambulatory Visit: Payer: Medicaid Other

## 2021-03-26 ENCOUNTER — Ambulatory Visit: Payer: Medicaid Other | Admitting: Hematology and Oncology

## 2021-03-26 ENCOUNTER — Telehealth: Payer: Self-pay

## 2021-03-26 ENCOUNTER — Inpatient Hospital Stay: Payer: Medicaid Other

## 2021-03-26 MED ORDER — APIXABAN 5 MG PO TABS: 5.0000 mg | ORAL_TABLET | Freq: Two times a day (BID) | ORAL | 0 refills | Status: DC

## 2021-03-26 NOTE — Assessment & Plan Note (Deleted)
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 ER/PR 95%, Ki-67 5%, HER2 negative 1+ by IHC positive lateral margin  Treatment plan: 1. lateral margin: Benign, AXLND: 25/26 LN Positive 2. Adj chemo with DD AC foll by taxol. 3. Adjuvant radiation therapy 4. Follow-up adjuvant antiestrogen therapy URCC nausea study CT CAP: 11/26/2020: Degenerative changes no metastatic disease Bone scan: 11/26/2020: Emphysema, no evidence of metastatic disease URCCnausea studyand [facility] blood draw study ------------------------------------------------------------------------------------------------------------------------ Current treatment:completed 4 cycles of Dose dense Adriamycin and Cytoxan Today is cycle 1 Taxol  Chemo toxicities: 1. Mild nausea 2. Fatigue 3. Hypokalemia: Send potassium replacement therapy 20 mEq daily.  We will give her 20 mEq of potassium chloride in the infusion. 4.  Hospitalization 02/22/2021 to 02/24/2021: Right IJ thrombosis associated with right IJ Port-A-Cath sent home on Eliquis  Monitoring closely for toxicities Pain regimen: She is currently on methadone.   Return to clinic in 2 weeks to start Taxol

## 2021-03-26 NOTE — Telephone Encounter (Signed)
Patient cancelled appointments for today due to transportation issues.  RN placed call to connect with patient regarding need to reschedule.

## 2021-03-27 ENCOUNTER — Encounter: Payer: Self-pay | Admitting: *Deleted

## 2021-03-27 ENCOUNTER — Telehealth: Payer: Self-pay | Admitting: Hematology and Oncology

## 2021-03-27 NOTE — Telephone Encounter (Signed)
Scheduled appointment per 07/21 sch msg. Patient is aware. 

## 2021-03-31 ENCOUNTER — Inpatient Hospital Stay: Payer: Medicaid Other

## 2021-03-31 ENCOUNTER — Encounter: Payer: Self-pay | Admitting: *Deleted

## 2021-03-31 ENCOUNTER — Telehealth: Payer: Self-pay | Admitting: *Deleted

## 2021-03-31 NOTE — Telephone Encounter (Signed)
Lydia Collins, Kyann is receiving Paclitaxel, she can receive this through an IV if she doesn't want to use the port.  She really needs to come in and receive the infusion.

## 2021-03-31 NOTE — Telephone Encounter (Signed)
Called and spoke with patient regarding appts and the importance of getting back on track with her chemo.  She agreed and promised she will be at her apt on 8/1. I reviewed arrival times with her and patient assured me that she had transportation for her appts. Encouraged her to call should she need anything or has questions. Patient verbalized understanding.

## 2021-03-31 NOTE — Telephone Encounter (Signed)
Lydia Collins states she did not feel comfortable coming for chemo without seeing her doctor. "I'm still concern about this thing in my neck, I still off-balanced but it is getting better. I want to make sure this thing is OK before I start my treatment and they use it".

## 2021-04-05 NOTE — Progress Notes (Signed)
Patient Care Team: Patient, [name] as PCP - General (General Practice) [name], RN as Oncology Nurse Navigator [name], RN as Oncology Nurse Navigator  DIAGNOSIS:    ICD-10-CM   1. Malignant neoplasm of lower-outer quadrant of left breast of female, estrogen receptor positive ([place])  C50.512    Z17.0       SUMMARY OF ONCOLOGIC HISTORY: Oncology History  Malignant neoplasm of lower-outer quadrant of left breast of female, estrogen receptor positive ([place])  07/23/2020 Initial Diagnosis   Screening mammogram showed a left breast distortion. Diagnostic mammogram showed a mass at the 4 o'clock position in the left breast, 0.8cm, and 2 adjacent masses in the left breast at the 2 o'clock position, 0.7cm and 0.6cm, and no left axillary lymphadenopathy. Biopsy showed no evidence of malignancy at the 2 o'clock position, and IDC at the 4 o'clock position, grade 1, HER-2 negative (1+), ER/PR+ 95%, Ki67 5%.    09/13/2020 Surgery   Left lumpectomy Lydia Collins): invasive lobular carcinoma, 1.5cm, 2 left axillary lymph nodes positive for metastatic carcinoma.   11/08/2020 Surgery   Re-excision and left axillary lymph node dissection Lydia Collins): no residual carcinoma in the left breast, 25/26 lymph nodes positive for carcinoma   11/08/2020 Cancer Staging   Staging form: Breast, AJCC 8th Edition - Pathologic stage from 11/08/2020: Stage IIIA (pT1c, pN3, cM0, G1, ER+, PR+, HER2-) - Signed by Gardenia Phlegm, NP on 02/28/2021  Stage prefix: Initial diagnosis  Histologic grading system: 3 grade system    12/06/2020 -  Chemotherapy    Patient is on Treatment Plan: BREAST ADJUVANT DOSE DENSE AC Q14D / PACLITAXEL Q7D         CHIEF COMPLIANT: Follow-up of left breast cancer on chemotherapy with Taxol  INTERVAL HISTORY: Lydia Collins is a 55 y.o. with above-mentioned history of  left breast cancer who is currently on adjuvant chemotherapy. She presents to the clinic today for  follow-up and possible treatment.  She developed a blood clot in the port area and was started on Eliquis on 03/26/2021.  She is tolerating Eliquis fairly well.  Because of that her treatment was discontinued for couple of weeks.  She is tolerating the blood thinner fairly well.  She does complain of a slight blurring of her vision which may be chemo related.  She thinks it is getting better probably because she has not received chemo in about 4 weeks.  She will be receiving her treatment through peripheral vein.  ALLERGIES:  is allergic to ibuprofen and naproxen.  MEDICATIONS:  Current Outpatient Medications  Medication Sig Dispense Refill   ALPRAZolam (XANAX) 0.25 MG tablet Take 1 tablet (0.25 mg total) by mouth at bedtime as needed for anxiety. 30 tablet 3   acetaminophen (TYLENOL) 500 MG tablet Take 1,000-1,500 mg by mouth every 6 (six) hours as needed for fever (pain).     apixaban (ELIQUIS) 5 MG TABS tablet Take 1 tablet (5 mg total) by mouth 2 (two) times daily. 60 tablet 0   cetirizine (ZYRTEC) 10 MG tablet Take 10 mg by mouth every morning.     chlorthalidone (HYGROTON) 25 MG tablet Take 25 mg by mouth every morning.     lidocaine-prilocaine (EMLA) cream Apply to affected area once (Patient taking differently: Apply 1 application topically once as needed (prior to port access).) 30 g 3   ondansetron (ZOFRAN) 8 MG tablet TAKE 1 TABLET (8 MG TOTAL) BY MOUTH 2 (TWO) TIMES DAILY AS NEEDED. START  ON THE THIRD DAY AFTER CHEMOTHERAPY. 30 tablet 1   oxyCODONE (OXY IR/ROXICODONE) 5 MG immediate release tablet Take 1 tablet (5 mg total) by mouth every 6 (six) hours as needed for severe pain (pain). 40 tablet 0   polyethylene glycol (MIRALAX / GLYCOLAX) 17 g packet Take 17 g by mouth daily as needed for moderate constipation. 14 each 0   potassium chloride SA (KLOR-CON) 20 MEQ tablet Take 1 tablet (20 mEq total) by mouth daily. 30 tablet 3   prochlorperazine (COMPAZINE) 10 MG tablet Take 1 tablet (10  mg total) by mouth every 6 (six) hours as needed (Nausea or vomiting). 30 tablet 3   senna-docusate (SENOKOT-S) 8.6-50 MG tablet Take 1 tablet by mouth 2 (two) times daily. 20 tablet 0   No current facility-administered medications for this visit.   Facility-Administered Medications Ordered in Other Visits  Medication Dose Route Frequency Provider Last Rate Last Admin   sodium chloride flush (NS) 0.9 % injection 10 mL  10 mL Intracatheter PRN Nicholas Lose, MD   10 mL at 12/27/20 1640   sodium chloride flush (NS) 0.9 % injection 10 mL  10 mL Intracatheter Once Nicholas Lose, MD        PHYSICAL EXAMINATION: ECOG PERFORMANCE STATUS: 1 - Symptomatic but completely ambulatory  Vitals:   04/07/21 1059 04/07/21 1100  BP: (!) 140/107 (!) 159/104  Pulse: (!) 116   Resp: 18   Temp: 98.3 F (36.8 C)   SpO2: 100%    Filed Weights   04/07/21 1059  Weight: 157 lb 1.6 oz (71.3 kg)    LABORATORY DATA:  I have reviewed the data as listed CMP Latest Ref Rng & Units 03/17/2021 02/28/2021 02/24/2021  Glucose 70 - 99 mg/dL 111(H) 85 104(H)  BUN 6 - 20 mg/dL '9 10 12  ' Creatinine 0.44 - 1.00 mg/dL 0.70 0.73 0.76  Sodium 135 - 145 mmol/L 139 141 139  Potassium 3.5 - 5.1 mmol/L 3.2(L) 3.8 3.0(L)  Chloride 98 - 111 mmol/L 98 105 108  CO2 22 - 32 mmol/L '30 25 24  ' Calcium 8.9 - 10.3 mg/dL 10.0 9.9 8.6(L)  Total Protein 6.5 - 8.1 g/dL 7.3 7.7 6.1(L)  Total Bilirubin 0.3 - 1.2 mg/dL 0.2(L) <0.2(L) 0.3  Alkaline Phos 38 - 126 U/L 87 111 88  AST 15 - 41 U/L 13(L) 11(L) 10(L)  ALT 0 - 44 U/L '11 6 9    ' Lab Results  Component Value Date   WBC 7.1 04/07/2021   HGB 13.5 04/07/2021   HCT 39.5 04/07/2021   MCV 90.8 04/07/2021   PLT 216 04/07/2021   NEUTROABS 4.5 04/07/2021    ASSESSMENT & PLAN:  Malignant neoplasm of lower-outer quadrant of left breast of female, estrogen receptor positive ([place]) 09/13/2020:Left lumpectomy Lydia Collins): Grade 1 invasive lobular carcinoma, 1.5cm, 2/3 left axillary lymph nodes  positive for metastatic carcinoma.  ER/PR 95%, Ki-67 5%, HER2 negative 1+ by IHC positive lateral margin   Treatment plan: 1.  lateral margin: Benign, AXLND: 25/26 LN Positive 2. Adj chemo with DD AC foll by taxol. 3.  Adjuvant radiation therapy 4.  Follow-up adjuvant antiestrogen therapy URCC nausea study CT CAP: 11/26/2020: Degenerative changes no metastatic disease Bone scan: 11/26/2020: Emphysema, no evidence of metastatic disease URCC nausea study and [facility] blood draw study ------------------------------------------------------------------------------------------------------------------------ Current treatment: completed 4 cycles of Dose dense Adriamycin and Cytoxan Today is cycle 1 Taxol Echocardiogram 12/03/2020: EF 50 to 55% low normal function   Chemo toxicities: Mild nausea and  fatigue   Thrombosis of the right port: Currently on Eliquis.  After 4 weeks or so we will obtain another ultrasound and see if he can use a port for chemo infusions.   Monitoring closely for toxicities Pain regimen: She is currently on methadone.     Return to clinic in 1 week for toxicity check and for cycle 2 Taxol    No orders of the defined types were placed in this encounter.  The patient has a good understanding of the overall plan. she agrees with it. she will call with any problems that may develop before the next visit here.  Total time spent: 30 mins including face to face time and time spent for planning, charting and coordination of care  Rulon Eisenmenger, MD, MPH 04/07/2021  I, Thana Ates, am acting as scribe for Dr. Nicholas Lose.  I have reviewed the above documentation for accuracy and completeness, and I agree with the above.

## 2021-04-07 ENCOUNTER — Inpatient Hospital Stay: Payer: Medicaid Other

## 2021-04-07 ENCOUNTER — Other Ambulatory Visit: Payer: Self-pay

## 2021-04-07 ENCOUNTER — Inpatient Hospital Stay: Payer: Medicaid Other | Attending: Hematology and Oncology | Admitting: Hematology and Oncology

## 2021-04-07 ENCOUNTER — Encounter: Payer: Self-pay | Admitting: *Deleted

## 2021-04-07 VITALS — BP 123/82 | HR 101 | Temp 98.0°F | Resp 18

## 2021-04-07 DIAGNOSIS — Z170 Estrogen receptor positive status [ER+]: Secondary | ICD-10-CM | POA: Insufficient documentation

## 2021-04-07 DIAGNOSIS — C50512 Malignant neoplasm of lower-outer quadrant of left female breast: Secondary | ICD-10-CM | POA: Diagnosis present

## 2021-04-07 DIAGNOSIS — C773 Secondary and unspecified malignant neoplasm of axilla and upper limb lymph nodes: Secondary | ICD-10-CM | POA: Insufficient documentation

## 2021-04-07 DIAGNOSIS — Z5111 Encounter for antineoplastic chemotherapy: Secondary | ICD-10-CM | POA: Diagnosis present

## 2021-04-07 LAB — CBC WITH DIFFERENTIAL (CANCER CENTER ONLY)
Abs Immature Granulocytes: 0.02 10*3/uL (ref 0.00–0.07)
Basophils Absolute: 0.1 10*3/uL (ref 0.0–0.1)
Basophils Relative: 1 %
Eosinophils Absolute: 0.4 10*3/uL (ref 0.0–0.5)
Eosinophils Relative: 5 %
HCT: 39.5 % (ref 36.0–46.0)
Hemoglobin: 13.5 g/dL (ref 12.0–15.0)
Immature Granulocytes: 0 %
Lymphocytes Relative: 24 %
Lymphs Abs: 1.7 10*3/uL (ref 0.7–4.0)
MCH: 31 pg (ref 26.0–34.0)
MCHC: 34.2 g/dL (ref 30.0–36.0)
MCV: 90.8 fL (ref 80.0–100.0)
Monocytes Absolute: 0.4 10*3/uL (ref 0.1–1.0)
Monocytes Relative: 6 %
Neutro Abs: 4.5 10*3/uL (ref 1.7–7.7)
Neutrophils Relative %: 64 %
Platelet Count: 216 10*3/uL (ref 150–400)
RBC: 4.35 MIL/uL (ref 3.87–5.11)
RDW: 13 % (ref 11.5–15.5)
WBC Count: 7.1 10*3/uL (ref 4.0–10.5)
nRBC: 0 % (ref 0.0–0.2)

## 2021-04-07 LAB — CMP (CANCER CENTER ONLY)
ALT: 13 U/L (ref 0–44)
AST: 15 U/L (ref 15–41)
Albumin: 3.8 g/dL (ref 3.5–5.0)
Alkaline Phosphatase: 75 U/L (ref 38–126)
Anion gap: 10 (ref 5–15)
BUN: 8 mg/dL (ref 6–20)
CO2: 27 mmol/L (ref 22–32)
Calcium: 9.7 mg/dL (ref 8.9–10.3)
Chloride: 102 mmol/L (ref 98–111)
Creatinine: 0.72 mg/dL (ref 0.44–1.00)
GFR, Estimated: >60 mL/min (ref >60)
Glucose, Bld: 88 mg/dL (ref 70–99)
Potassium: 3.5 mmol/L (ref 3.5–5.1)
Sodium: 139 mmol/L (ref 135–145)
Total Bilirubin: 0.2 mg/dL — ABNORMAL LOW (ref 0.3–1.2)
Total Protein: 7 g/dL (ref 6.5–8.1)

## 2021-04-07 MED ORDER — DIPHENHYDRAMINE HCL 50 MG/ML IJ SOLN
INTRAMUSCULAR | Status: AC
  Filled 2021-04-07: qty 1

## 2021-04-07 MED ORDER — SODIUM CHLORIDE 0.9 % IV SOLN
80.0000 mg/m2 | Freq: Once | INTRAVENOUS | Status: AC
  Administered 2021-04-07: 150 mg via INTRAVENOUS
  Filled 2021-04-07: qty 25

## 2021-04-07 MED ORDER — SODIUM CHLORIDE 0.9 % IV SOLN
Freq: Once | INTRAVENOUS | Status: AC
  Filled 2021-04-07: qty 250

## 2021-04-07 MED ORDER — DIPHENHYDRAMINE HCL 50 MG/ML IJ SOLN
25.0000 mg | Freq: Once | INTRAMUSCULAR | Status: AC
  Administered 2021-04-07: 25 mg via INTRAVENOUS

## 2021-04-07 MED ORDER — SODIUM CHLORIDE 0.9% FLUSH
10.0000 mL | Freq: Once | INTRAVENOUS | Status: AC
  Filled 2021-04-07: qty 10

## 2021-04-07 MED ORDER — FAMOTIDINE 20 MG IN NS 100 ML IVPB
INTRAVENOUS | Status: AC
  Filled 2021-04-07: qty 100

## 2021-04-07 MED ORDER — FAMOTIDINE 20 MG IN NS 100 ML IVPB
20.0000 mg | Freq: Once | INTRAVENOUS | Status: AC
  Administered 2021-04-07: 20 mg via INTRAVENOUS

## 2021-04-07 MED ORDER — ALPRAZOLAM 0.25 MG PO TABS
0.2500 mg | ORAL_TABLET | Freq: Every evening | ORAL | 3 refills | Status: DC | PRN
Start: 2021-04-07 — End: 2021-04-15

## 2021-04-07 MED ORDER — SODIUM CHLORIDE 0.9 % IV SOLN
10.0000 mg | Freq: Once | INTRAVENOUS | Status: AC
  Administered 2021-04-07: 10 mg via INTRAVENOUS
  Filled 2021-04-07: qty 10

## 2021-04-07 NOTE — Patient Instructions (Signed)
[facility] ONCOLOGY  Discharge Instructions: Thank you for choosing [facility] to provide your oncology and hematology care.   If you have a lab appointment with the [facility], please go directly to the [facility] and check in at the registration area.   Wear comfortable clothing and clothing appropriate for easy access to any Portacath or PICC line.   We strive to give you quality time with your provider. You may need to reschedule your appointment if you arrive late (15 or more minutes).  Arriving late affects you and other patients whose appointments are after yours.  Also, if you miss three or more appointments without notifying the office, you may be dismissed from the clinic at the provider's discretion.      For prescription refill requests, have your pharmacy contact our office and allow 72 hours for refills to be completed.    Today you received the following chemotherapy and/or immunotherapy agents taxol       To help prevent nausea and vomiting after your treatment, we encourage you to take your nausea medication as directed.  BELOW ARE SYMPTOMS THAT SHOULD BE REPORTED IMMEDIATELY: *FEVER GREATER THAN 100.4 F (38 C) OR HIGHER *CHILLS OR SWEATING *NAUSEA AND VOMITING THAT IS NOT CONTROLLED WITH YOUR NAUSEA MEDICATION *UNUSUAL SHORTNESS OF BREATH *UNUSUAL BRUISING OR BLEEDING *URINARY PROBLEMS (pain or burning when urinating, or frequent urination) *BOWEL PROBLEMS (unusual diarrhea, constipation, pain near the anus) TENDERNESS IN MOUTH AND THROAT WITH OR WITHOUT PRESENCE OF ULCERS (sore throat, sores in mouth, or a toothache) UNUSUAL RASH, SWELLING OR PAIN  UNUSUAL VAGINAL DISCHARGE OR ITCHING   Items with * indicate a potential emergency and should be followed up as soon as possible or go to the Emergency Department if any problems should occur.  Please show the CHEMOTHERAPY ALERT CARD or IMMUNOTHERAPY ALERT CARD at check-in to the  Emergency Department and triage nurse.  Should you have questions after your visit or need to cancel or reschedule your appointment, please contact [facility]  Dept: [phone]  and follow the prompts.  Office hours are 8:00 a.m. to 4:30 p.m. Monday - Friday. Please note that voicemails left after 4:00 p.m. may not be returned until the following business day.  We are closed weekends and major holidays. You have access to a nurse at all times for urgent questions. Please call the main number to the clinic Dept: [phone] and follow the prompts.   For any non-urgent questions, you may also contact your provider using MyChart. We now offer e-Visits for anyone 66 and older to request care online for non-urgent symptoms. For details visit [email or web address].   Also download the MyChart app! Go to the app store, search "MyChart", open the app, select [facility], and log in with your MyChart username and password.  Due to Covid, a mask is required upon entering the hospital/clinic. If you do not have a mask, one will be given to you upon arrival. For doctor visits, patients may have 1 support person aged 70 or older with them. For treatment visits, patients cannot have anyone with them due to current Covid guidelines and our immunocompromised population.    Paclitaxel injection What is this medication? PACLITAXEL (PAK li TAX el) is a chemotherapy drug. It targets fast dividing cells, like cancer cells, and causes these cells to die. This medicine is used to treat ovarian cancer, breast cancer, lung cancer, Kaposi's sarcoma, andother cancers. This medicine  may be used for other purposes; ask your health care provider orpharmacist if you have questions. COMMON BRAND NAME(S): Onxol, Taxol What should I tell my care team before I take this medication? They need to know if you have any of these conditions: history of irregular heartbeat liver disease low blood  counts, like low white cell, platelet, or red cell counts lung or breathing disease, like asthma tingling of the fingers or toes, or other nerve disorder an unusual or allergic reaction to paclitaxel, alcohol, polyoxyethylated castor oil, other chemotherapy, other medicines, foods, dyes, or preservatives pregnant or trying to get pregnant breast-feeding How should I use this medication? This drug is given as an infusion into a vein. It is administered in a hospitalor clinic by a specially trained health care professional. Talk to your pediatrician regarding the use of this medicine in children.Special care may be needed. Overdosage: If you think you have taken too much of this medicine contact apoison control center or emergency room at once. NOTE: This medicine is only for you. Do not share this medicine with others. What if I miss a dose? It is important not to miss your dose. Call your doctor or health careprofessional if you are unable to keep an appointment. What may interact with this medication? Do not take this medicine with any of the following medications: live virus vaccines This medicine may also interact with the following medications: antiviral medicines for hepatitis, HIV or AIDS certain antibiotics like erythromycin and clarithromycin certain medicines for fungal infections like ketoconazole and itraconazole certain medicines for seizures like carbamazepine, phenobarbital, phenytoin gemfibrozil nefazodone rifampin St. John's wort This list may not describe all possible interactions. Give your health care provider a list of all the medicines, herbs, non-prescription drugs, or dietary supplements you use. Also tell them if you smoke, drink alcohol, or use illegaldrugs. Some items may interact with your medicine. What should I watch for while using this medication? Your condition will be monitored carefully while you are receiving this medicine. You will need important blood  work done while you are taking thismedicine. This medicine can cause serious allergic reactions. To reduce your risk you will need to take other medicine(s) before treatment with this medicine. If you experience allergic reactions like skin rash, itching or hives, swelling of theface, lips, or tongue, tell your doctor or health care professional right away. In some cases, you may be given additional medicines to help with side effects.Follow all directions for their use. This drug may make you feel generally unwell. This is not uncommon, as chemotherapy can affect healthy cells as well as cancer cells. Report any side effects. Continue your course of treatment even though you feel ill unless yourdoctor tells you to stop. Call your doctor or health care professional for advice if you get a fever, chills or sore throat, or other symptoms of a cold or flu. Do not treat yourself. This drug decreases your body's ability to fight infections. Try toavoid being around people who are sick. This medicine may increase your risk to bruise or bleed. Call your doctor orhealth care professional if you notice any unusual bleeding. Be careful brushing and flossing your teeth or using a toothpick because you may get an infection or bleed more easily. If you have any dental work done,tell your dentist you are receiving this medicine. Avoid taking products that contain aspirin, acetaminophen, ibuprofen, naproxen, or ketoprofen unless instructed by your doctor. These medicines may hide afever. Do not become pregnant while taking  this medicine. Women should inform their doctor if they wish to become pregnant or think they might be pregnant. There is a potential for serious side effects to an unborn child. Talk to your health care professional or pharmacist for more information. Do not breast-feed aninfant while taking this medicine. Men are advised not to father a child while receiving this medicine. This product may contain  alcohol. Ask your pharmacist or healthcare provider if this medicine contains alcohol. Be sure to tell all healthcare providers you are taking this medicine. Certain medicines, like metronidazole and disulfiram, can cause an unpleasant reaction when taken with alcohol. The reaction includes flushing, headache, nausea, vomiting, sweating, and increased thirst. Thereaction can last from 30 minutes to several hours. What side effects may I notice from receiving this medication? Side effects that you should report to your doctor or health care professionalas soon as possible: allergic reactions like skin rash, itching or hives, swelling of the face, lips, or tongue breathing problems changes in vision fast, irregular heartbeat high or low blood pressure mouth sores pain, tingling, numbness in the hands or feet signs of decreased platelets or bleeding - bruising, pinpoint red spots on the skin, black, tarry stools, blood in the urine signs of decreased red blood cells - unusually weak or tired, feeling faint or lightheaded, falls signs of infection - fever or chills, cough, sore throat, pain or difficulty passing urine signs and symptoms of liver injury like dark yellow or brown urine; general ill feeling or flu-like symptoms; light-colored stools; loss of appetite; nausea; right upper belly pain; unusually weak or tired; yellowing of the eyes or skin swelling of the ankles, feet, hands unusually slow heartbeat Side effects that usually do not require medical attention (report to yourdoctor or health care professional if they continue or are bothersome): diarrhea hair loss loss of appetite muscle or joint pain nausea, vomiting pain, redness, or irritation at site where injected tiredness This list may not describe all possible side effects. Call your doctor for medical advice about side effects. You may report side effects to FDA at1-800-FDA-1088. Where should I keep my medication? This drug is  given in a hospital or clinic and will not be stored at home. NOTE: This sheet is a summary. It may not cover all possible information. If you have questions about this medicine, talk to your doctor, pharmacist, orhealth care provider.  2022 Elsevier/Gold Standard (2019-07-26 13:37:23)

## 2021-04-07 NOTE — Assessment & Plan Note (Signed)
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 ER/PR 95%, Ki-67 5%, HER2 negative 1+ by IHC positive lateral margin  Treatment plan: 1. lateral margin: Benign, AXLND: 25/26 LN Positive 2. Adj chemo with DD AC foll by taxol. 3. Adjuvant radiation therapy 4. Follow-up adjuvant antiestrogen therapy URCC nausea study CT CAP: 11/26/2020: Degenerative changes no metastatic disease Bone scan: 11/26/2020: Emphysema, no evidence of metastatic disease [facility]RCCnausea studyand [facility] blood draw study ------------------------------------------------------------------------------------------------------------------------ Current treatment:completed 4 cycles of Dose dense Adriamycin and Cytoxan Today is cycle 1 Taxol Echocardiogram3/29/2022: EF 50 to 55% low normal function  Chemo toxicities: Mild nausea Fatigue Hypokalemia: 20 mEq of potassium chloride in the infusion.  Monitoring closely for toxicities Pain regimen: She is currently on methadone.    Return to clinic in 1 week for toxicity check and for cycle 2 Taxol

## 2021-04-07 NOTE — Progress Notes (Signed)
Ok to treat with elevated HR per Dr.Gudena.

## 2021-04-08 ENCOUNTER — Telehealth: Payer: Self-pay | Admitting: Hematology and Oncology

## 2021-04-08 ENCOUNTER — Encounter: Payer: Self-pay | Admitting: *Deleted

## 2021-04-08 NOTE — Telephone Encounter (Signed)
Scheduled per 8/1 los. Called pt unable to leave a msg.

## 2021-04-09 ENCOUNTER — Telehealth: Payer: Self-pay

## 2021-04-09 NOTE — Telephone Encounter (Signed)
RN reviewed after hours call report regarding patient questioning redness after infusion.   RN placed call.  Voicemail left for call back.

## 2021-04-14 MED FILL — Dexamethasone Sodium Phosphate Inj 100 MG/10ML: INTRAMUSCULAR | Qty: 1 | Status: AC

## 2021-04-14 NOTE — Progress Notes (Signed)
Patient Care Team: Pcp, No as PCP - General [name], RN as Oncology Nurse Navigator [name], RN as Oncology Nurse Navigator  DIAGNOSIS:    ICD-10-CM   1. Malignant neoplasm of lower-outer quadrant of left breast of female, estrogen receptor positive ([facility])  C50.512    Z17.0       SUMMARY OF ONCOLOGIC HISTORY: Oncology History  Malignant neoplasm of lower-outer quadrant of left breast of female, estrogen receptor positive ([facility])  07/23/2020 Initial Diagnosis   Screening mammogram showed a left breast distortion. Diagnostic mammogram showed a mass at the 4 o'clock position in the left breast, 0.8cm, and 2 adjacent masses in the left breast at the 2 o'clock position, 0.7cm and 0.6cm, and no left axillary lymphadenopathy. Biopsy showed no evidence of malignancy at the 2 o'clock position, and IDC at the 4 o'clock position, grade 1, HER-2 negative (1+), ER/PR+ 95%, Ki67 5%.    09/13/2020 Surgery   Left lumpectomy [name]): invasive lobular carcinoma, 1.5cm, 2 left axillary lymph nodes positive for metastatic carcinoma.   11/08/2020 Surgery   Re-excision and left axillary lymph node dissection [name]): no residual carcinoma in the left breast, 25/26 lymph nodes positive for carcinoma   11/08/2020 Cancer Staging   Staging form: Breast, AJCC 8th Edition - Pathologic stage from 11/08/2020: Stage IIIA (pT1c, pN3, cM0, G1, ER+, PR+, HER2-) - Signed by Gardenia Phlegm, NP on 02/28/2021  Stage prefix: Initial diagnosis  Histologic grading system: 3 grade system    12/06/2020 -  Chemotherapy    Patient is on Treatment Plan: BREAST ADJUVANT DOSE DENSE AC Q14D / PACLITAXEL Q7D         CHIEF COMPLIANT: Cycle 2 Taxol  INTERVAL HISTORY: Lydia Collins is a 55 y.o. with above-mentioned history of left breast cancer who is currently on adjuvant chemotherapy with Taxol. Lydia Collins presents to the clinic today for Cycle 2. Lydia Collins also tolerated cycle 1 Taxol extremely well without any  problems or concerns.  Lydia Collins has forgotten to take potassium supplements.  Therefore her potassium today is 3.  Lydia Collins complains of muscle aches and pains.  Lydia Collins also complains of profound anxiety.  Lydia Collins takes 0.25 mg of Xanax at bedtime.  ALLERGIES:  is allergic to ibuprofen and naproxen.  MEDICATIONS:  Current Outpatient Medications  Medication Sig Dispense Refill   acetaminophen (TYLENOL) 500 MG tablet Take 1,000-1,500 mg by mouth every 6 (six) hours as needed for fever (pain).     ALPRAZolam (XANAX) 0.25 MG tablet Take 1 tablet (0.25 mg total) by mouth 2 (two) times daily as needed for anxiety. 60 tablet 3   apixaban (ELIQUIS) 5 MG TABS tablet Take 1 tablet (5 mg total) by mouth 2 (two) times daily. 60 tablet 0   cetirizine (ZYRTEC) 10 MG tablet Take 10 mg by mouth every morning.     chlorthalidone (HYGROTON) 25 MG tablet Take 25 mg by mouth every morning.     lidocaine-prilocaine (EMLA) cream Apply to affected area once (Patient taking differently: Apply 1 application topically once as needed (prior to port access).) 30 g 3   ondansetron (ZOFRAN) 8 MG tablet TAKE 1 TABLET (8 MG TOTAL) BY MOUTH 2 (TWO) TIMES DAILY AS NEEDED. START ON THE THIRD DAY AFTER CHEMOTHERAPY. 30 tablet 1   oxyCODONE (OXY IR/ROXICODONE) 5 MG immediate release tablet Take 1 tablet (5 mg total) by mouth every 6 (six) hours as needed for severe pain (pain). 40 tablet 0   polyethylene glycol (MIRALAX / GLYCOLAX)  17 g packet Take 17 g by mouth daily as needed for moderate constipation. 14 each 0   potassium chloride SA (KLOR-CON) 20 MEQ tablet Take 1 tablet (20 mEq total) by mouth daily. 30 tablet 3   prochlorperazine (COMPAZINE) 10 MG tablet Take 1 tablet (10 mg total) by mouth every 6 (six) hours as needed (Nausea or vomiting). 30 tablet 3   senna-docusate (SENOKOT-S) 8.6-50 MG tablet Take 1 tablet by mouth 2 (two) times daily. 20 tablet 0   No current facility-administered medications for this visit.   Facility-Administered  Medications Ordered in Other Visits  Medication Dose Route Frequency Provider Last Rate Last Admin   sodium chloride flush (NS) 0.9 % injection 10 mL  10 mL Intracatheter PRN Nicholas Lose, MD   10 mL at 12/27/20 1640   sodium chloride flush (NS) 0.9 % injection 10 mL  10 mL Intracatheter Once Nicholas Lose, MD        PHYSICAL EXAMINATION: ECOG PERFORMANCE STATUS: 1 - Symptomatic but completely ambulatory  Vitals:   04/15/21 1237  BP: (!) 151/89  Pulse: 84  Resp: 19  Temp: 97.8 F (36.6 C)  SpO2: 100%   Filed Weights   04/15/21 1237  Weight: 158 lb (71.7 kg)    LABORATORY DATA:  I have reviewed the data as listed CMP Latest Ref Rng & Units 04/15/2021 04/07/2021 03/17/2021  Glucose 70 - 99 mg/dL 93 88 111(H)  BUN 6 - 20 mg/dL _0 Creatinine 0.44 - 1.00 mg/dL 0.70 0.72 0.70  Sodium 135 - 145 mmol/L 139 139 139  Potassium 3.5 - 5.1 mmol/L 3.0(L) 3.5 3.2(L)  Chloride 98 - 111 mmol/L 100 102 98  CO2 22 - 32 mmol/L _1 Calcium 8.9 - 10.3 mg/dL 9.8 9.7 10.0  Total Protein 6.5 - 8.1 g/dL 7.1 7.0 7.3  Total Bilirubin 0.3 - 1.2 mg/dL 0.3 0.2(L) 0.2(L)  Alkaline Phos 38 - 126 U/L 86 75 87  AST 15 - 41 U/L 15 15 13(L)  ALT 0 - 44 U/L _2 Lab Results  Component Value Date   WBC 7.9 04/15/2021   HGB 13.1 04/15/2021   HCT 38.3 04/15/2021   MCV 90.8 04/15/2021   PLT 197 04/15/2021   NEUTROABS 5.3 04/15/2021    ASSESSMENT & PLAN:  Malignant neoplasm of lower-outer quadrant of left breast of female, estrogen receptor positive ([facility]) 09/13/2020:Left lumpectomy [name]): Grade 1 invasive lobular carcinoma, 1.5cm, 2/3 left axillary lymph nodes positive for metastatic carcinoma.  ER/PR 95%, Ki-67 5%, HER2 negative 1+ by IHC positive lateral margin   Treatment plan: 1.  lateral margin: Benign, AXLND: 25/26 LN Positive 2. Adj chemo with DD AC foll by taxol. 3.  Adjuvant radiation therapy 4.  Follow-up adjuvant antiestrogen therapy URCC nausea study CT CAP: 11/26/2020:  Degenerative changes no metastatic disease Bone scan: 11/26/2020: Emphysema, no evidence of metastatic disease URCC nausea study and [facility] blood draw study ------------------------------------------------------------------------------------------------------------------------ Current treatment: completed 4 cycles of Dose dense Adriamycin and Cytoxan Today is cycle 2 Taxol Echocardiogram 12/03/2020: EF 50 to 55% low normal function   Chemo toxicities: Fatigue  Hypokalemia: 20 mEq of potassium chloride  Severe anxiety: Increased Xanax to 0.25 mg p.o. twice daily as needed   Monitoring closely for toxicities Pain regimen: Lydia Collins is currently on methadone.     Return to clinic in weekly for Taxol and every other week for follow-up with me.    No orders of the defined  types were placed in this encounter.  The patient has a good understanding of the overall plan. Lydia Collins agrees with it. Lydia Collins will call with any problems that may develop before the next visit here.  Total time spent: 30 mins including face to face time and time spent for planning, charting and coordination of care  Rulon Eisenmenger, MD, MPH 04/15/2021  I, Thana Ates, am acting as scribe for Dr. Nicholas Lose.  I have reviewed the above documentation for accuracy and completeness, and I agree with the above.

## 2021-04-15 ENCOUNTER — Inpatient Hospital Stay: Payer: Medicaid Other

## 2021-04-15 ENCOUNTER — Inpatient Hospital Stay (HOSPITAL_BASED_OUTPATIENT_CLINIC_OR_DEPARTMENT_OTHER): Payer: Medicaid Other | Admitting: Hematology and Oncology

## 2021-04-15 ENCOUNTER — Other Ambulatory Visit: Payer: Self-pay

## 2021-04-15 DIAGNOSIS — Z170 Estrogen receptor positive status [ER+]: Secondary | ICD-10-CM

## 2021-04-15 DIAGNOSIS — C50512 Malignant neoplasm of lower-outer quadrant of left female breast: Secondary | ICD-10-CM | POA: Diagnosis not present

## 2021-04-15 DIAGNOSIS — Z5111 Encounter for antineoplastic chemotherapy: Secondary | ICD-10-CM | POA: Diagnosis not present

## 2021-04-15 LAB — CMP (CANCER CENTER ONLY)
ALT: 18 U/L (ref 0–44)
AST: 15 U/L (ref 15–41)
Albumin: 3.7 g/dL (ref 3.5–5.0)
Alkaline Phosphatase: 86 U/L (ref 38–126)
Anion gap: 12 (ref 5–15)
BUN: 11 mg/dL (ref 6–20)
CO2: 27 mmol/L (ref 22–32)
Calcium: 9.8 mg/dL (ref 8.9–10.3)
Chloride: 100 mmol/L (ref 98–111)
Creatinine: 0.7 mg/dL (ref 0.44–1.00)
GFR, Estimated: >60 mL/min (ref >60)
Glucose, Bld: 93 mg/dL (ref 70–99)
Potassium: 3 mmol/L — ABNORMAL LOW (ref 3.5–5.1)
Sodium: 139 mmol/L (ref 135–145)
Total Bilirubin: 0.3 mg/dL (ref 0.3–1.2)
Total Protein: 7.1 g/dL (ref 6.5–8.1)

## 2021-04-15 LAB — CBC WITH DIFFERENTIAL (CANCER CENTER ONLY)
Abs Immature Granulocytes: 0.02 10*3/uL (ref 0.00–0.07)
Basophils Absolute: 0 10*3/uL (ref 0.0–0.1)
Basophils Relative: 1 %
Eosinophils Absolute: 0.3 10*3/uL (ref 0.0–0.5)
Eosinophils Relative: 4 %
HCT: 38.3 % (ref 36.0–46.0)
Hemoglobin: 13.1 g/dL (ref 12.0–15.0)
Immature Granulocytes: 0 %
Lymphocytes Relative: 24 %
Lymphs Abs: 1.9 10*3/uL (ref 0.7–4.0)
MCH: 31 pg (ref 26.0–34.0)
MCHC: 34.2 g/dL (ref 30.0–36.0)
MCV: 90.8 fL (ref 80.0–100.0)
Monocytes Absolute: 0.4 10*3/uL (ref 0.1–1.0)
Monocytes Relative: 5 %
Neutro Abs: 5.3 10*3/uL (ref 1.7–7.7)
Neutrophils Relative %: 66 %
Platelet Count: 197 10*3/uL (ref 150–400)
RBC: 4.22 MIL/uL (ref 3.87–5.11)
RDW: 12.8 % (ref 11.5–15.5)
WBC Count: 7.9 10*3/uL (ref 4.0–10.5)
nRBC: 0 % (ref 0.0–0.2)

## 2021-04-15 MED ORDER — SODIUM CHLORIDE 0.9 % IV SOLN
80.0000 mg/m2 | Freq: Once | INTRAVENOUS | Status: AC
  Administered 2021-04-15: 150 mg via INTRAVENOUS
  Filled 2021-04-15: qty 25

## 2021-04-15 MED ORDER — SODIUM CHLORIDE 0.9 % IV SOLN
10.0000 mg | Freq: Once | INTRAVENOUS | Status: AC
  Administered 2021-04-15: 10 mg via INTRAVENOUS
  Filled 2021-04-15: qty 10

## 2021-04-15 MED ORDER — FAMOTIDINE 20 MG IN NS 100 ML IVPB
INTRAVENOUS | Status: AC
  Filled 2021-04-15: qty 100

## 2021-04-15 MED ORDER — FAMOTIDINE 20 MG IN NS 100 ML IVPB
20.0000 mg | Freq: Once | INTRAVENOUS | Status: AC
  Administered 2021-04-15: 20 mg via INTRAVENOUS

## 2021-04-15 MED ORDER — DIPHENHYDRAMINE HCL 50 MG/ML IJ SOLN
25.0000 mg | Freq: Once | INTRAMUSCULAR | Status: AC
  Administered 2021-04-15: 25 mg via INTRAVENOUS

## 2021-04-15 MED ORDER — ALPRAZOLAM 0.25 MG PO TABS: 0.2500 mg | ORAL_TABLET | Freq: Two times a day (BID) | ORAL | 3 refills | Status: AC | PRN

## 2021-04-15 MED ORDER — SODIUM CHLORIDE 0.9 % IV SOLN
Freq: Once | INTRAVENOUS | Status: AC
  Filled 2021-04-15: qty 250

## 2021-04-15 MED ORDER — DIPHENHYDRAMINE HCL 50 MG/ML IJ SOLN
INTRAMUSCULAR | Status: AC
  Filled 2021-04-15: qty 1

## 2021-04-15 NOTE — Patient Instructions (Signed)
[facility] ONCOLOGY  Discharge Instructions: Thank you for choosing [facility] to provide your oncology and hematology care.   If you have a lab appointment with the [facility], please go directly to the [facility] and check in at the registration area.   Wear comfortable clothing and clothing appropriate for easy access to any Portacath or PICC line.   We strive to give you quality time with your provider. You may need to reschedule your appointment if you arrive late (15 or more minutes).  Arriving late affects you and other patients whose appointments are after yours.  Also, if you miss three or more appointments without notifying the office, you may be dismissed from the clinic at the provider's discretion.      For prescription refill requests, have your pharmacy contact our office and allow 72 hours for refills to be completed.    Today you received the following chemotherapy and/or immunotherapy agents Paclitaxel (Taxol).      To help prevent nausea and vomiting after your treatment, we encourage you to take your nausea medication as directed.  BELOW ARE SYMPTOMS THAT SHOULD BE REPORTED IMMEDIATELY: *FEVER GREATER THAN 100.4 F (38 C) OR HIGHER *CHILLS OR SWEATING *NAUSEA AND VOMITING THAT IS NOT CONTROLLED WITH YOUR NAUSEA MEDICATION *UNUSUAL SHORTNESS OF BREATH *UNUSUAL BRUISING OR BLEEDING *URINARY PROBLEMS (pain or burning when urinating, or frequent urination) *BOWEL PROBLEMS (unusual diarrhea, constipation, pain near the anus) TENDERNESS IN MOUTH AND THROAT WITH OR WITHOUT PRESENCE OF ULCERS (sore throat, sores in mouth, or a toothache) UNUSUAL RASH, SWELLING OR PAIN  UNUSUAL VAGINAL DISCHARGE OR ITCHING   Items with * indicate a potential emergency and should be followed up as soon as possible or go to the Emergency Department if any problems should occur.  Please show the CHEMOTHERAPY ALERT CARD or IMMUNOTHERAPY ALERT CARD at  check-in to the Emergency Department and triage nurse.  Should you have questions after your visit or need to cancel or reschedule your appointment, please contact [facility]  Dept: [phone]  and follow the prompts.  Office hours are 8:00 a.m. to 4:30 p.m. Monday - Friday. Please note that voicemails left after 4:00 p.m. may not be returned until the following business day.  We are closed weekends and major holidays. You have access to a nurse at all times for urgent questions. Please call the main number to the clinic Dept: [phone] and follow the prompts.   For any non-urgent questions, you may also contact your provider using MyChart. We now offer e-Visits for anyone 50 and older to request care online for non-urgent symptoms. For details visit [email or web address].   Also download the MyChart app! Go to the app store, search "MyChart", open the app, select [facility], and log in with your MyChart username and password.  Due to Covid, a mask is required upon entering the hospital/clinic. If you do not have a mask, one will be given to you upon arrival. For doctor visits, patients may have 1 support person aged 79 or older with them. For treatment visits, patients cannot have anyone with them due to current Covid guidelines and our immunocompromised population.

## 2021-04-15 NOTE — Assessment & Plan Note (Signed)
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 ER/PR 95%, Ki-67 5%, HER2 negative 1+ by IHC positive lateral margin  Treatment plan: 1. lateral margin: Benign, AXLND: 25/26 LN Positive 2. Adj chemo with DD AC foll by taxol. 3. Adjuvant radiation therapy 4. Follow-up adjuvant antiestrogen therapy URCC nausea study CT CAP: 11/26/2020: Degenerative changes no metastatic disease Bone scan: 11/26/2020: Emphysema, no evidence of metastatic disease [facility]nausea studyand [facility] blood draw study ------------------------------------------------------------------------------------------------------------------------ Current treatment:completed 4 cycles of Dose dense Adriamycin and Cytoxan Today is cycle 2 Taxol Echocardiogram3/29/2022: EF 50 to 55% low normal function  Chemo toxicities: Fatigue Hypokalemia: 20 mEq of potassium chloride  Severe anxiety: Increased Xanax 2.5 mg p.o. twice daily as needed  Monitoring closely for toxicities Pain regimen: She is currently on methadone.    Return to clinic in weekly for Taxol and every other week for follow-up with me.

## 2021-04-16 ENCOUNTER — Telehealth: Payer: Self-pay

## 2021-04-16 ENCOUNTER — Other Ambulatory Visit: Payer: Self-pay

## 2021-04-16 MED ORDER — GABAPENTIN 100 MG PO CAPS: 300.0000 mg | ORAL_CAPSULE | Freq: Every day | ORAL | 1 refills | Status: AC

## 2021-04-16 NOTE — Telephone Encounter (Signed)
RN received voicemail from patient requesting refill on Gabapentin.  Patient also inquiring to see what she can take for leg pain and foot pain.   RN returned call, no answer.  Rx sent for Gabapentin.

## 2021-04-18 ENCOUNTER — Telehealth: Payer: Self-pay

## 2021-04-18 NOTE — Telephone Encounter (Signed)
RN returned call.  Voicemail left for call back.  

## 2021-04-21 ENCOUNTER — Telehealth: Payer: Self-pay | Admitting: *Deleted

## 2021-04-21 ENCOUNTER — Inpatient Hospital Stay: Payer: Medicaid Other

## 2021-04-21 MED ORDER — MAGIC MOUTHWASH W/LIDOCAINE: 5.0000 mL | Freq: Four times a day (QID) | ORAL | 0 refills | Status: AC | PRN

## 2021-04-21 NOTE — Telephone Encounter (Signed)
Pt was a No Show for today's treatment. Attempted to call. No answer.

## 2021-04-21 NOTE — Telephone Encounter (Signed)
LM that Dr Lindi Adie is sending in a prescription for Magic Mouthwash to use every 6 hours as needed for mouth/throat pain. To swish in mouth, gargle and swallow.

## 2021-04-21 NOTE — Telephone Encounter (Signed)
Lydia Collins left a message stating she has mouth sores, sore throat and cannot eat or swallow due to pain.Is asking for something to relieve the pain so she can eat.

## 2021-04-25 ENCOUNTER — Other Ambulatory Visit: Payer: Self-pay | Admitting: Hematology and Oncology

## 2021-04-25 MED FILL — Dexamethasone Sodium Phosphate Inj 100 MG/10ML: INTRAMUSCULAR | Qty: 1 | Status: AC

## 2021-04-27 NOTE — Progress Notes (Signed)
Patient Care Team: Pcp, No as PCP - General [name], RN as Oncology Nurse Navigator [name], RN as Oncology Nurse Navigator  DIAGNOSIS:    ICD-10-CM   1. Malignant neoplasm of lower-outer quadrant of left breast of female, estrogen receptor positive ([place])  C50.512    Z17.0       SUMMARY OF ONCOLOGIC HISTORY: Oncology History  Malignant neoplasm of lower-outer quadrant of left breast of female, estrogen receptor positive ([place])  07/23/2020 Initial Diagnosis   Screening mammogram showed a left breast distortion. Diagnostic mammogram showed a mass at the 4 o'clock position in the left breast, 0.8cm, and 2 adjacent masses in the left breast at the 2 o'clock position, 0.7cm and 0.6cm, and no left axillary lymphadenopathy. Biopsy showed no evidence of malignancy at the 2 o'clock position, and IDC at the 4 o'clock position, grade 1, HER-2 negative (1+), ER/PR+ 95%, Ki67 5%.    09/13/2020 Surgery   Left lumpectomy Lydia Collins): invasive lobular carcinoma, 1.5cm, 2 left axillary lymph nodes positive for metastatic carcinoma.   11/08/2020 Surgery   Re-excision and left axillary lymph node dissection Lydia Collins): no residual carcinoma in the left breast, 25/26 lymph nodes positive for carcinoma   11/08/2020 Cancer Staging   Staging form: Breast, AJCC 8th Edition - Pathologic stage from 11/08/2020: Stage IIIA (pT1c, pN3, cM0, G1, ER+, PR+, HER2-) - Signed by Gardenia Phlegm, NP on 02/28/2021 Stage prefix: Initial diagnosis Histologic grading system: 3 grade system   12/06/2020 -  Chemotherapy    Patient is on Treatment Plan: BREAST ADJUVANT DOSE DENSE AC Q14D / PACLITAXEL Q7D         CHIEF COMPLIANT: Cycle 3 Taxol  INTERVAL HISTORY: Lydia Collins is a 55 y.o. with above-mentioned history of left breast cancer who is currently on adjuvant chemotherapy with Taxol. She presents to the clinic today for Cycle 3.  She is complaining of severe neck pain that started about a week ago in  the bottom of the neck and radiates down to the shoulder.  She denies any nausea or vomiting.  She missed last week's appointment because her boyfriend had a procedure on his leg for diabetic foot.  ALLERGIES:  is allergic to ibuprofen and naproxen.  MEDICATIONS:  Current Outpatient Medications  Medication Sig Dispense Refill   acetaminophen (TYLENOL) 500 MG tablet Take 1,000-1,500 mg by mouth every 6 (six) hours as needed for fever (pain).     ALPRAZolam (XANAX) 0.25 MG tablet Take 1 tablet (0.25 mg total) by mouth 2 (two) times daily as needed for anxiety. 60 tablet 3   cetirizine (ZYRTEC) 10 MG tablet Take 10 mg by mouth every morning.     chlorthalidone (HYGROTON) 25 MG tablet Take 25 mg by mouth every morning.     ELIQUIS 5 MG TABS tablet TAKE ONE TABLET BY MOUTH TWICE DAILY 60 tablet 0   gabapentin (NEURONTIN) 100 MG capsule Take 3 capsules (300 mg total) by mouth at bedtime. 90 capsule 1   lidocaine-prilocaine (EMLA) cream Apply to affected area once (Patient taking differently: Apply 1 application topically once as needed (prior to port access).) 30 g 3   magic mouthwash w/lidocaine SOLN Take 5 mLs by mouth 4 (four) times daily as needed for mouth pain. 100 mL 0   ondansetron (ZOFRAN) 8 MG tablet TAKE 1 TABLET (8 MG TOTAL) BY MOUTH 2 (TWO) TIMES DAILY AS NEEDED. START ON THE THIRD DAY AFTER CHEMOTHERAPY. 30 tablet 1   oxyCODONE (OXY IR/ROXICODONE)  5 MG immediate release tablet Take 1 tablet (5 mg total) by mouth every 6 (six) hours as needed for severe pain (pain). 40 tablet 0   polyethylene glycol (MIRALAX / GLYCOLAX) 17 g packet Take 17 g by mouth daily as needed for moderate constipation. 14 each 0   potassium chloride SA (KLOR-CON) 20 MEQ tablet Take 1 tablet (20 mEq total) by mouth daily. 30 tablet 3   prochlorperazine (COMPAZINE) 10 MG tablet Take 1 tablet (10 mg total) by mouth every 6 (six) hours as needed (Nausea or vomiting). 30 tablet 3   senna-docusate (SENOKOT-S) 8.6-50 MG  tablet Take 1 tablet by mouth 2 (two) times daily. 20 tablet 0   No current facility-administered medications for this visit.   Facility-Administered Medications Ordered in Other Visits  Medication Dose Route Frequency Provider Last Rate Last Admin   sodium chloride flush (NS) 0.9 % injection 10 mL  10 mL Intracatheter PRN Nicholas Lose, MD   10 mL at 12/27/20 1640   sodium chloride flush (NS) 0.9 % injection 10 mL  10 mL Intracatheter Once Nicholas Lose, MD        PHYSICAL EXAMINATION: ECOG PERFORMANCE STATUS: 1 - Symptomatic but completely ambulatory  Vitals:   04/28/21 0949 04/28/21 0950  BP: (!) 132/104 (!) 140/102  Pulse: (!) 113   Resp: 19   Temp: 98.1 F (36.7 C)   SpO2: 99%    Filed Weights   04/28/21 0949  Weight: 154 lb 3.2 oz (69.9 kg)     LABORATORY DATA:  I have reviewed the data as listed CMP Latest Ref Rng & Units 04/28/2021 04/15/2021 04/07/2021  Glucose 70 - 99 mg/dL 106(H) 93 88  BUN 6 - 20 mg/dL '8 11 8  ' Creatinine 0.44 - 1.00 mg/dL 0.76 0.70 0.72  Sodium 135 - 145 mmol/L 142 139 139  Potassium 3.5 - 5.1 mmol/L 3.4(L) 3.0(L) 3.5  Chloride 98 - 111 mmol/L 104 100 102  CO2 22 - 32 mmol/L '25 27 27  ' Calcium 8.9 - 10.3 mg/dL 10.1 9.8 9.7  Total Protein 6.5 - 8.1 g/dL 7.7 7.1 7.0  Total Bilirubin 0.3 - 1.2 mg/dL <0.2(L) 0.3 0.2(L)  Alkaline Phos 38 - 126 U/L 91 86 75  AST 15 - 41 U/L 12(L) 15 15  ALT 0 - 44 U/L '14 18 13    ' Lab Results  Component Value Date   WBC 6.2 04/28/2021   HGB 14.0 04/28/2021   HCT 42.1 04/28/2021   MCV 91.3 04/28/2021   PLT 298 04/28/2021   NEUTROABS 3.8 04/28/2021    ASSESSMENT & PLAN:  Malignant neoplasm of lower-outer quadrant of left breast of female, estrogen receptor positive ([place]) /03/2021:Left lumpectomy Lydia Collins): Grade 1 invasive lobular carcinoma, 1.5cm, 2/3 left axillary lymph nodes positive for metastatic carcinoma.  ER/PR 95%, Ki-67 5%, HER2 negative 1+ by IHC positive lateral margin   Treatment plan: 1.  lateral  margin: Benign, AXLND: 25/26 LN Positive 2. Adj chemo with DD AC foll by taxol. 3.  Adjuvant radiation therapy 4.  Follow-up adjuvant antiestrogen therapy URCC nausea study CT CAP: 11/26/2020: Degenerative changes no metastatic disease Bone scan: 11/26/2020: Emphysema, no evidence of metastatic disease [facility] nausea study and [facility] blood draw study ------------------------------------------------------------------------------------------------------------------------ Current treatment: completed 4 cycles of Dose dense Adriamycin and Cytoxan Today is cycle 3 Taxol Echocardiogram 12/03/2020: EF 50 to 55% low normal function   Chemo toxicities: Fatigue  Hypokalemia: 20 mEq of potassium chloride  Severe anxiety: Increased Xanax to 0.25 mg  p.o. twice daily as needed Pain in the neck radiating down into the shoulder: I suspect this is a muscle sprain.  I sent a prescription for Skelaxin muscle relaxant.   During infusion we will give her a 5 mg of Percocet for pain relief.  Monitoring closely for toxicities She missed last week's appointment because her boyfriend has diabetic foot and is getting procedures done.   Return to clinic in weekly for Taxol and every other week for follow-up with me.    No orders of the defined types were placed in this encounter.  The patient has a good understanding of the overall plan. she agrees with it. she will call with any problems that may develop before the next visit here.  Total time spent: 30 mins including face to face time and time spent for planning, charting and coordination of care  Rulon Eisenmenger, MD, MPH 04/28/2021  I, Thana Ates, am acting as scribe for Dr. Nicholas Lose.  I have reviewed the above documentation for accuracy and completeness, and I agree with the above.

## 2021-04-28 ENCOUNTER — Other Ambulatory Visit: Payer: Self-pay | Admitting: Hematology and Oncology

## 2021-04-28 ENCOUNTER — Other Ambulatory Visit: Payer: Self-pay

## 2021-04-28 ENCOUNTER — Inpatient Hospital Stay: Payer: Medicaid Other

## 2021-04-28 ENCOUNTER — Inpatient Hospital Stay (HOSPITAL_BASED_OUTPATIENT_CLINIC_OR_DEPARTMENT_OTHER): Payer: Medicaid Other | Admitting: Hematology and Oncology

## 2021-04-28 VITALS — BP 129/88 | HR 94

## 2021-04-28 DIAGNOSIS — C50512 Malignant neoplasm of lower-outer quadrant of left female breast: Secondary | ICD-10-CM

## 2021-04-28 DIAGNOSIS — Z170 Estrogen receptor positive status [ER+]: Secondary | ICD-10-CM | POA: Diagnosis not present

## 2021-04-28 DIAGNOSIS — Z5111 Encounter for antineoplastic chemotherapy: Secondary | ICD-10-CM | POA: Diagnosis not present

## 2021-04-28 LAB — CBC WITH DIFFERENTIAL (CANCER CENTER ONLY)
Abs Immature Granulocytes: 0.01 10*3/uL (ref 0.00–0.07)
Basophils Absolute: 0 10*3/uL (ref 0.0–0.1)
Basophils Relative: 1 %
Eosinophils Absolute: 0.1 10*3/uL (ref 0.0–0.5)
Eosinophils Relative: 2 %
HCT: 42.1 % (ref 36.0–46.0)
Hemoglobin: 14 g/dL (ref 12.0–15.0)
Immature Granulocytes: 0 %
Lymphocytes Relative: 31 %
Lymphs Abs: 1.9 10*3/uL (ref 0.7–4.0)
MCH: 30.4 pg (ref 26.0–34.0)
MCHC: 33.3 g/dL (ref 30.0–36.0)
MCV: 91.3 fL (ref 80.0–100.0)
Monocytes Absolute: 0.4 10*3/uL (ref 0.1–1.0)
Monocytes Relative: 6 %
Neutro Abs: 3.8 10*3/uL (ref 1.7–7.7)
Neutrophils Relative %: 60 %
Platelet Count: 298 10*3/uL (ref 150–400)
RBC: 4.61 MIL/uL (ref 3.87–5.11)
RDW: 13.4 % (ref 11.5–15.5)
WBC Count: 6.2 10*3/uL (ref 4.0–10.5)
nRBC: 0 % (ref 0.0–0.2)

## 2021-04-28 LAB — CMP (CANCER CENTER ONLY)
ALT: 14 U/L (ref 0–44)
AST: 12 U/L — ABNORMAL LOW (ref 15–41)
Albumin: 4.1 g/dL (ref 3.5–5.0)
Alkaline Phosphatase: 91 U/L (ref 38–126)
Anion gap: 13 (ref 5–15)
BUN: 8 mg/dL (ref 6–20)
CO2: 25 mmol/L (ref 22–32)
Calcium: 10.1 mg/dL (ref 8.9–10.3)
Chloride: 104 mmol/L (ref 98–111)
Creatinine: 0.76 mg/dL (ref 0.44–1.00)
GFR, Estimated: >60 mL/min (ref >60)
Glucose, Bld: 106 mg/dL — ABNORMAL HIGH (ref 70–99)
Potassium: 3.4 mmol/L — ABNORMAL LOW (ref 3.5–5.1)
Sodium: 142 mmol/L (ref 135–145)
Total Bilirubin: <0.2 mg/dL — ABNORMAL LOW (ref 0.3–1.2)
Total Protein: 7.7 g/dL (ref 6.5–8.1)

## 2021-04-28 MED ORDER — CYCLOBENZAPRINE HCL 5 MG PO TABS: 5.0000 mg | ORAL_TABLET | Freq: Three times a day (TID) | ORAL | 0 refills | Status: AC | PRN

## 2021-04-28 MED ORDER — DIPHENHYDRAMINE HCL 50 MG/ML IJ SOLN
25.0000 mg | Freq: Once | INTRAMUSCULAR | Status: AC
  Administered 2021-04-28: 25 mg via INTRAVENOUS
  Filled 2021-04-28: qty 1

## 2021-04-28 MED ORDER — SODIUM CHLORIDE 0.9 % IV SOLN: Freq: Once | INTRAVENOUS | Status: AC

## 2021-04-28 MED ORDER — FAMOTIDINE 20 MG IN NS 100 ML IVPB
20.0000 mg | Freq: Once | INTRAVENOUS | Status: AC
  Administered 2021-04-28: 20 mg via INTRAVENOUS
  Filled 2021-04-28: qty 100

## 2021-04-28 MED ORDER — SODIUM CHLORIDE 0.9 % IV SOLN
80.0000 mg/m2 | Freq: Once | INTRAVENOUS | Status: AC
  Administered 2021-04-28: 150 mg via INTRAVENOUS
  Filled 2021-04-28: qty 25

## 2021-04-28 MED ORDER — OXYCODONE HCL 5 MG PO TABS
5.0000 mg | ORAL_TABLET | Freq: Once | ORAL | Status: AC
  Administered 2021-04-28: 5 mg via ORAL
  Filled 2021-04-28: qty 1

## 2021-04-28 MED ORDER — SODIUM CHLORIDE 0.9 % IV SOLN
10.0000 mg | Freq: Once | INTRAVENOUS | Status: AC
  Administered 2021-04-28: 10 mg via INTRAVENOUS
  Filled 2021-04-28: qty 10

## 2021-04-28 MED ORDER — ACETAMINOPHEN 325 MG PO TABS
325.0000 mg | ORAL_TABLET | Freq: Once | ORAL | Status: AC
  Administered 2021-04-28: 325 mg via ORAL
  Filled 2021-04-28: qty 1

## 2021-04-28 MED ORDER — METAXALONE 800 MG PO TABS: 800.0000 mg | ORAL_TABLET | Freq: Every evening | ORAL | 0 refills | Status: DC | PRN

## 2021-04-28 NOTE — Progress Notes (Signed)
Sent a prescription for Flexeril

## 2021-04-28 NOTE — Assessment & Plan Note (Signed)
/  03/2021:Left lumpectomy Lydia Collins): Grade 1 invasive lobular carcinoma, 1.5cm, 2/3 left axillary lymph nodes positive for metastatic carcinoma. ER/PR 95%, Ki-67 5%, HER2 negative 1+ by IHC positive lateral margin  Treatment plan: 1. lateral margin: Benign, AXLND: 25/26 LN Positive 2. Adj chemo with DD AC foll by taxol. 3. Adjuvant radiation therapy 4. Follow-up adjuvant antiestrogen therapy URCC nausea study CT CAP: 11/26/2020: Degenerative changes no metastatic disease Bone scan: 11/26/2020: Emphysema, no evidence of metastatic disease [facility]nausea studyand [facility] blood draw study ------------------------------------------------------------------------------------------------------------------------ Current treatment:completed 4 cycles ofDose dense Adriamycin and CytoxanToday is cycle 3 Taxol Echocardiogram3/29/2022: EF 50 to 55% low normal function  Chemo toxicities: Fatigue Hypokalemia:20 mEq of potassium chloride  Severe anxiety: Increased Xanax to 0.25 mg p.o. twice daily as needed  Monitoring closely for toxicities Pain regimen:She is currently on methadone.    Return to clinic in weekly for Taxol and every other week for follow-up with me.

## 2021-04-28 NOTE — Patient Instructions (Signed)
[facility] ONCOLOGY  Discharge Instructions: Thank you for choosing [facility] to provide your oncology and hematology care.   If you have a lab appointment with the [facility], please go directly to the [facility] and check in at the registration area.   Wear comfortable clothing and clothing appropriate for easy access to any Portacath or PICC line.   We strive to give you quality time with your provider. You may need to reschedule your appointment if you arrive late (15 or more minutes).  Arriving late affects you and other patients whose appointments are after yours.  Also, if you miss three or more appointments without notifying the office, you may be dismissed from the clinic at the provider's discretion.      For prescription refill requests, have your pharmacy contact our office and allow 72 hours for refills to be completed.    Today you received the following chemotherapy and/or immunotherapy agents: Taxol.      To help prevent nausea and vomiting after your treatment, we encourage you to take your nausea medication as directed.  BELOW ARE SYMPTOMS THAT SHOULD BE REPORTED IMMEDIATELY: *FEVER GREATER THAN 100.4 F (38 C) OR HIGHER *CHILLS OR SWEATING *NAUSEA AND VOMITING THAT IS NOT CONTROLLED WITH YOUR NAUSEA MEDICATION *UNUSUAL SHORTNESS OF BREATH *UNUSUAL BRUISING OR BLEEDING *URINARY PROBLEMS (pain or burning when urinating, or frequent urination) *BOWEL PROBLEMS (unusual diarrhea, constipation, pain near the anus) TENDERNESS IN MOUTH AND THROAT WITH OR WITHOUT PRESENCE OF ULCERS (sore throat, sores in mouth, or a toothache) UNUSUAL RASH, SWELLING OR PAIN  UNUSUAL VAGINAL DISCHARGE OR ITCHING   Items with * indicate a potential emergency and should be followed up as soon as possible or go to the Emergency Department if any problems should occur.  Please show the CHEMOTHERAPY ALERT CARD or IMMUNOTHERAPY ALERT CARD at check-in to the  Emergency Department and triage nurse.  Should you have questions after your visit or need to cancel or reschedule your appointment, please contact [facility]  Dept: [phone]  and follow the prompts.  Office hours are 8:00 a.m. to 4:30 p.m. Monday - Friday. Please note that voicemails left after 4:00 p.m. may not be returned until the following business day.  We are closed weekends and major holidays. You have access to a nurse at all times for urgent questions. Please call the main number to the clinic Dept: [phone] and follow the prompts.   For any non-urgent questions, you may also contact your provider using MyChart. We now offer e-Visits for anyone 25 and older to request care online for non-urgent symptoms. For details visit [email or web address].   Also download the MyChart app! Go to the app store, search "MyChart", open the app, select [facility], and log in with your MyChart username and password.  Due to Covid, a mask is required upon entering the hospital/clinic. If you do not have a mask, one will be given to you upon arrival. For doctor visits, patients may have 1 support person aged 65 or older with them. For treatment visits, patients cannot have anyone with them due to current Covid guidelines and our immunocompromised population.

## 2021-05-02 ENCOUNTER — Telehealth: Payer: Self-pay | Admitting: *Deleted

## 2021-05-02 MED FILL — Dexamethasone Sodium Phosphate Inj 100 MG/10ML: INTRAMUSCULAR | Qty: 1 | Status: AC

## 2021-05-02 NOTE — Telephone Encounter (Signed)
Lydia Collins states she took the Flexeril all week and it has not helped her neck pain. Wants to know if there is something else Dr Lindi Adie can give her for the pain.

## 2021-05-02 NOTE — Telephone Encounter (Signed)
Notified to try OTC lidocaine pain patches.

## 2021-05-05 ENCOUNTER — Telehealth: Payer: Self-pay

## 2021-05-05 ENCOUNTER — Other Ambulatory Visit: Payer: Medicaid Other

## 2021-05-05 ENCOUNTER — Ambulatory Visit: Payer: Medicaid Other

## 2021-05-05 NOTE — Telephone Encounter (Signed)
RN left voicemail requesting call back regarding appointments 8/29

## 2021-05-09 MED FILL — Dexamethasone Sodium Phosphate Inj 100 MG/10ML: INTRAMUSCULAR | Qty: 1 | Status: AC

## 2021-05-13 ENCOUNTER — Telehealth: Payer: Self-pay | Admitting: *Deleted

## 2021-05-13 ENCOUNTER — Ambulatory Visit: Payer: Medicaid Other | Admitting: Hematology and Oncology

## 2021-05-13 ENCOUNTER — Ambulatory Visit: Payer: Medicaid Other

## 2021-05-13 ENCOUNTER — Other Ambulatory Visit: Payer: Medicaid Other

## 2021-05-13 NOTE — Telephone Encounter (Signed)
RN attempt to contact pt regarding missed apts for today.  No answer.  Unable to LVM due to VM being full.

## 2021-05-13 NOTE — Assessment & Plan Note (Deleted)
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 ER/PR 95%, Ki-67 5%, HER2 negative 1+ by IHC positive lateral margin  Treatment plan: 1. lateral margin: Benign, AXLND: 25/26 LN Positive 2. Adj chemo with DD AC foll by taxol. 3. Adjuvant radiation therapy 4. Follow-up adjuvant antiestrogen therapy URCC nausea study CT CAP: 11/26/2020: Degenerative changes no metastatic disease Bone scan: 11/26/2020: Emphysema, no evidence of metastatic disease [facility]nausea studyand [facility] blood draw study ------------------------------------------------------------------------------------------------------------------------ Current treatment:completed 4 cycles ofDose dense Adriamycin and CytoxanToday is cycle4Taxol Echocardiogram3/29/2022: EF 50 to 55% low normal function  Chemo toxicities: Fatigue Hypokalemia:20 mEq of potassium chloride Severe anxiety: IncreasedXanax to 0.25 mg p.o. twice daily as needed Pain in the neck radiating down into the shoulder: I suspect this is a muscle sprain.  I sent a prescription for Skelaxin muscle relaxant.   During infusion we will give her a 5 mg of Percocet for pain relief.  Monitoring closely for toxicities She missed last week's appointment because her boyfriend has diabetic foot and is getting procedures done.  Return to clinic inweekly for Taxol and every other week for follow-up with me.

## 2021-05-14 ENCOUNTER — Encounter: Payer: Self-pay | Admitting: *Deleted

## 2021-05-19 ENCOUNTER — Other Ambulatory Visit: Payer: Medicaid Other

## 2021-05-19 ENCOUNTER — Ambulatory Visit: Payer: Medicaid Other | Admitting: Hematology and Oncology

## 2021-05-19 MED FILL — Dexamethasone Sodium Phosphate Inj 100 MG/10ML: INTRAMUSCULAR | Qty: 1 | Status: AC

## 2021-05-19 NOTE — Assessment & Plan Note (Deleted)
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 ER/PR 95%, Ki-67 5%, HER2 negative 1+ by IHC positive lateral margin  Treatment plan: 1. lateral margin: Benign, AXLND: 25/26 LN Positive 2. Adj chemo with DD AC foll by taxol. 3. Adjuvant radiation therapy 4. Follow-up adjuvant antiestrogen therapy URCC nausea study CT CAP: 11/26/2020: Degenerative changes no metastatic disease Bone scan: 11/26/2020: Emphysema, no evidence of metastatic disease [facility]nausea studyand [facility] blood draw study ------------------------------------------------------------------------------------------------------------------------ Current treatment:completed 4 cycles ofDose dense Adriamycin and CytoxanToday is cycle4Taxol Echocardiogram3/29/2022: EF 50 to 55% low normal function  Chemo toxicities: Fatigue Hypokalemia:20 mEq of potassium chloride Severe anxiety: IncreasedXanax to 0.25 mg p.o. 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

## 2021-05-20 ENCOUNTER — Other Ambulatory Visit: Payer: Medicaid Other

## 2021-05-20 ENCOUNTER — Ambulatory Visit: Payer: Medicaid Other | Admitting: Hematology and Oncology

## 2021-05-20 ENCOUNTER — Encounter: Payer: Self-pay | Admitting: *Deleted

## 2021-05-20 ENCOUNTER — Telehealth: Payer: Self-pay | Admitting: *Deleted

## 2021-05-20 ENCOUNTER — Ambulatory Visit: Payer: Medicaid Other

## 2021-05-20 NOTE — Telephone Encounter (Signed)
RN attempt x2 (pt cell and hotel room) regarding missed appt this week.  No answer.  Unable to LVM due to VM being full.

## 2021-05-26 MED FILL — Dexamethasone Sodium Phosphate Inj 100 MG/10ML: INTRAMUSCULAR | Qty: 1 | Status: AC

## 2021-05-27 ENCOUNTER — Telehealth: Payer: Self-pay | Admitting: *Deleted

## 2021-05-27 ENCOUNTER — Inpatient Hospital Stay: Payer: Medicaid Other | Attending: Hematology and Oncology

## 2021-05-27 ENCOUNTER — Ambulatory Visit: Payer: Medicaid Other | Admitting: Physician Assistant

## 2021-05-27 ENCOUNTER — Other Ambulatory Visit: Payer: Medicaid Other

## 2021-05-27 ENCOUNTER — Inpatient Hospital Stay (HOSPITAL_BASED_OUTPATIENT_CLINIC_OR_DEPARTMENT_OTHER): Payer: Medicaid Other | Admitting: Adult Health

## 2021-05-27 DIAGNOSIS — C50512 Malignant neoplasm of lower-outer quadrant of left female breast: Secondary | ICD-10-CM

## 2021-05-27 DIAGNOSIS — Z170 Estrogen receptor positive status [ER+]: Secondary | ICD-10-CM

## 2021-05-27 NOTE — Telephone Encounter (Signed)
Attempted to reach patient at number listed. She has an identified voicemail but unfortunately was full and I couldn't leave a message. Attempted to call her husband Delvin with number listed, but no answer and no voicemail. I will try emailing to the address listed to see if we can get a response.

## 2021-05-27 NOTE — Progress Notes (Signed)
Patient did not show.  Letter mailed.  LOS sent to scheduling to reschedule. Breast Navigators made aware.  Wilber Bihari, NP

## 2021-05-28 ENCOUNTER — Encounter: Payer: Self-pay | Admitting: *Deleted

## 2021-05-28 ENCOUNTER — Ambulatory Visit: Payer: Medicaid Other

## 2021-05-28 ENCOUNTER — Ambulatory Visit: Payer: Medicaid Other | Admitting: Internal Medicine

## 2021-05-28 ENCOUNTER — Other Ambulatory Visit: Payer: Medicaid Other

## 2021-06-02 ENCOUNTER — Telehealth: Payer: Self-pay

## 2021-06-02 MED ORDER — APIXABAN 5 MG PO TABS: 5.0000 mg | ORAL_TABLET | Freq: Two times a day (BID) | ORAL | 0 refills | Status: AC

## 2021-06-02 MED FILL — Dexamethasone Sodium Phosphate Inj 100 MG/10ML: INTRAMUSCULAR | Qty: 1 | Status: AC

## 2021-06-02 NOTE — Telephone Encounter (Signed)
Pt called and LVM, states she needs refill on "blood thinner" and an appt with MD, and that she is homeless but has a stable place to stay at the moment. Attempted to return call pt pt, no answer and VM box is full. Will send schedule message.

## 2021-06-03 ENCOUNTER — Ambulatory Visit: Payer: Medicaid Other | Admitting: Adult Health

## 2021-06-03 ENCOUNTER — Encounter: Payer: Self-pay | Admitting: *Deleted

## 2021-06-03 ENCOUNTER — Other Ambulatory Visit: Payer: Medicaid Other

## 2021-06-03 ENCOUNTER — Ambulatory Visit: Payer: Medicaid Other

## 2021-06-03 ENCOUNTER — Telehealth: Payer: Self-pay | Admitting: *Deleted

## 2021-06-03 NOTE — Telephone Encounter (Signed)
RN placed call to pt regarding missed appointments today.  Pt answered and states she was experiencing issues with housing and is finally situated in a new home.  Pt states she will be present for appointments next week. RN educated pt to call the office to re-schedule if needed.

## 2021-06-09 MED FILL — Dexamethasone Sodium Phosphate Inj 100 MG/10ML: INTRAMUSCULAR | Qty: 1 | Status: AC

## 2021-06-10 ENCOUNTER — Other Ambulatory Visit: Payer: Medicaid Other

## 2021-06-10 ENCOUNTER — Encounter: Payer: Self-pay | Admitting: *Deleted

## 2021-06-10 ENCOUNTER — Ambulatory Visit: Payer: Medicaid Other | Admitting: Adult Health

## 2021-06-10 ENCOUNTER — Ambulatory Visit: Payer: Medicaid Other

## 2021-06-10 NOTE — Progress Notes (Signed)
RN attempt x1 to contact pt regarding missed appt today.  No answer, LVM for pt to return call to the office.

## 2021-06-11 ENCOUNTER — Encounter: Payer: Self-pay | Admitting: Hematology and Oncology

## 2021-06-12 ENCOUNTER — Telehealth: Payer: Self-pay | Admitting: Hematology and Oncology

## 2021-06-12 NOTE — Telephone Encounter (Signed)
Patient has not shown up for multiple chemotherapy appointments. I called her to discuss this with her but her voice mailbox is full and we cannot leave any messages. If she does not show up for the next appointment then we should cancel all further chemotherapy.

## 2021-06-16 MED FILL — Dexamethasone Sodium Phosphate Inj 100 MG/10ML: INTRAMUSCULAR | Qty: 1 | Status: AC

## 2021-06-17 ENCOUNTER — Inpatient Hospital Stay: Payer: Medicaid Other

## 2021-06-17 ENCOUNTER — Telehealth: Payer: Self-pay | Admitting: *Deleted

## 2021-06-17 ENCOUNTER — Inpatient Hospital Stay: Payer: Medicaid Other | Admitting: Adult Health

## 2021-06-17 NOTE — Telephone Encounter (Signed)
Called pt to discuss missed appt for chemo. No answer, unable to leave vm as vm is full. Dr. Lindi Adie notified. Msg sent to cancel further chemo appts.

## 2021-06-18 ENCOUNTER — Encounter: Payer: Self-pay | Admitting: *Deleted

## 2021-06-24 ENCOUNTER — Ambulatory Visit: Payer: Medicaid Other

## 2021-06-24 ENCOUNTER — Ambulatory Visit: Payer: Medicaid Other | Admitting: Adult Health

## 2021-06-24 ENCOUNTER — Other Ambulatory Visit: Payer: Medicaid Other

## 2021-07-01 ENCOUNTER — Ambulatory Visit: Payer: Medicaid Other

## 2021-07-01 ENCOUNTER — Ambulatory Visit: Payer: Medicaid Other | Admitting: Adult Health

## 2021-07-01 ENCOUNTER — Other Ambulatory Visit: Payer: Medicaid Other

## 2021-07-08 ENCOUNTER — Ambulatory Visit: Payer: Medicaid Other | Admitting: Adult Health

## 2021-07-08 ENCOUNTER — Ambulatory Visit: Payer: Medicaid Other

## 2021-07-08 ENCOUNTER — Other Ambulatory Visit: Payer: Medicaid Other

## 2021-07-15 ENCOUNTER — Other Ambulatory Visit: Payer: Medicaid Other

## 2021-07-15 ENCOUNTER — Ambulatory Visit: Payer: Medicaid Other | Admitting: Adult Health

## 2021-07-15 ENCOUNTER — Ambulatory Visit: Payer: Medicaid Other

## 2021-08-13 ENCOUNTER — Other Ambulatory Visit: Payer: Self-pay | Admitting: Hematology and Oncology

## 2021-09-16 ENCOUNTER — Encounter: Payer: Self-pay | Admitting: *Deleted

## 2021-09-16 NOTE — Progress Notes (Signed)
Received call from [facility] with [facility] [phone]) requesting PCS services for patient and provider form to be filled out.  Pt has not been seen in our office since August of 2022.  Our office has attempt to contact pt numerous times with no return call back.  MD states he is not able to proceed with ordering home health at this time due to pt not being under active care.  Home heath representative verbalized understanding and appreciative of information provided.

## 2021-12-25 ENCOUNTER — Encounter (HOSPITAL_COMMUNITY): Payer: Self-pay

## 2022-05-26 IMAGING — MG DIGITAL SCREENING BILAT W/ TOMO W/ CAD
8 series · 8 of 24 positions shown · non-contrast
Comparison: None.

CLINICAL DATA: Screening. This is the patient's initial baseline
mammogram.

EXAM:
DIGITAL SCREENING BILATERAL MAMMOGRAM WITH TOMO AND CAD

[L MLO synth-2D]
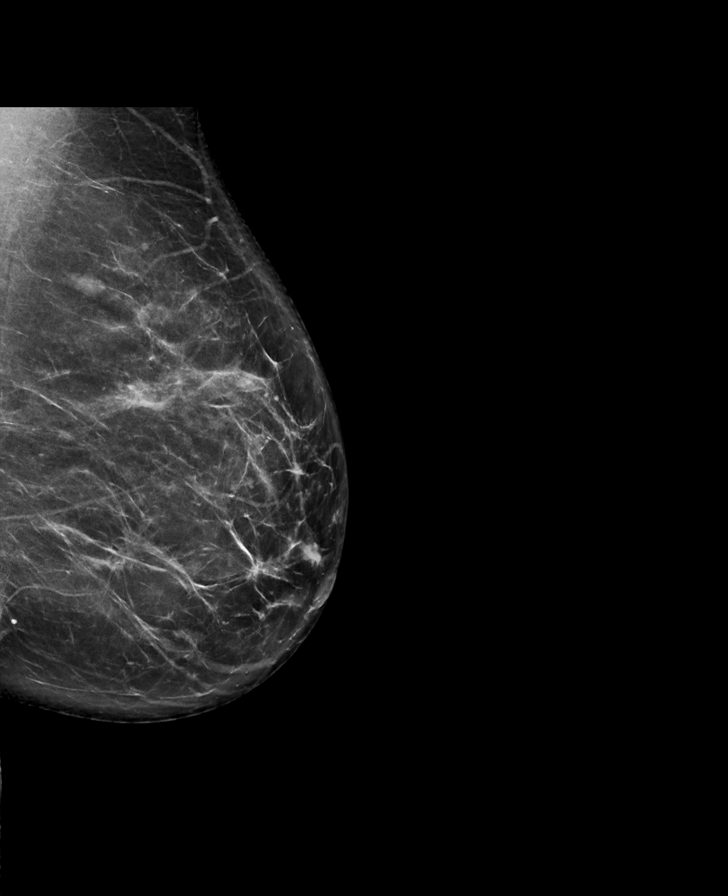

[L CC synth-2D]
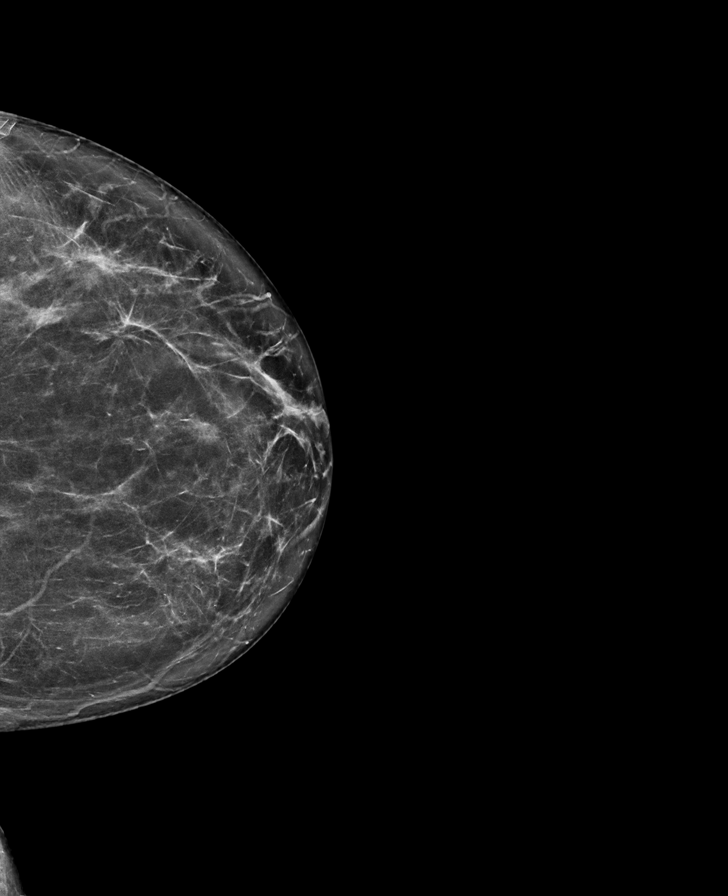

[R CC synth-2D]
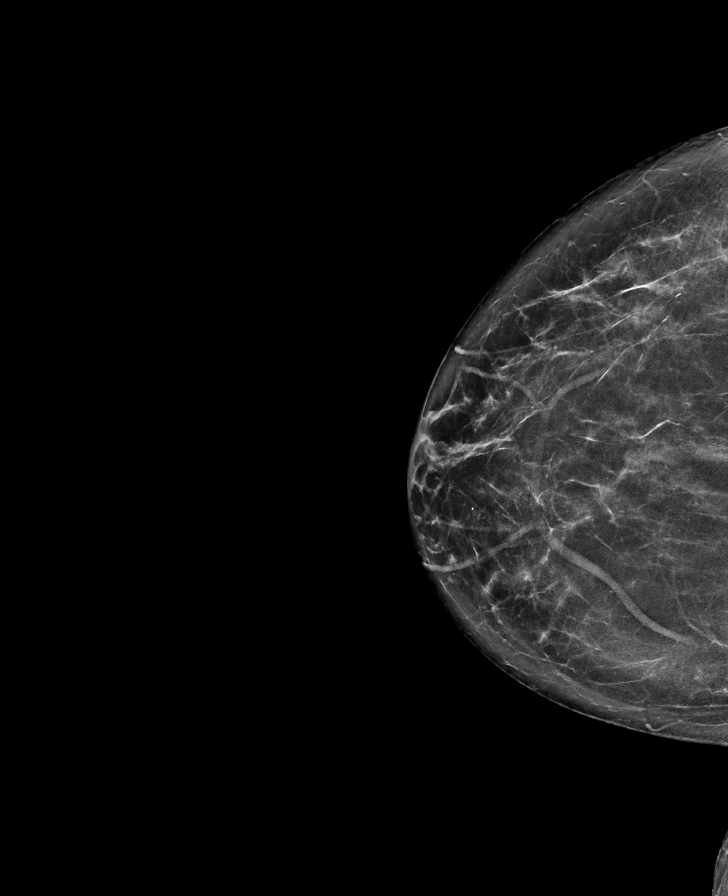

[R MLO synth-2D]
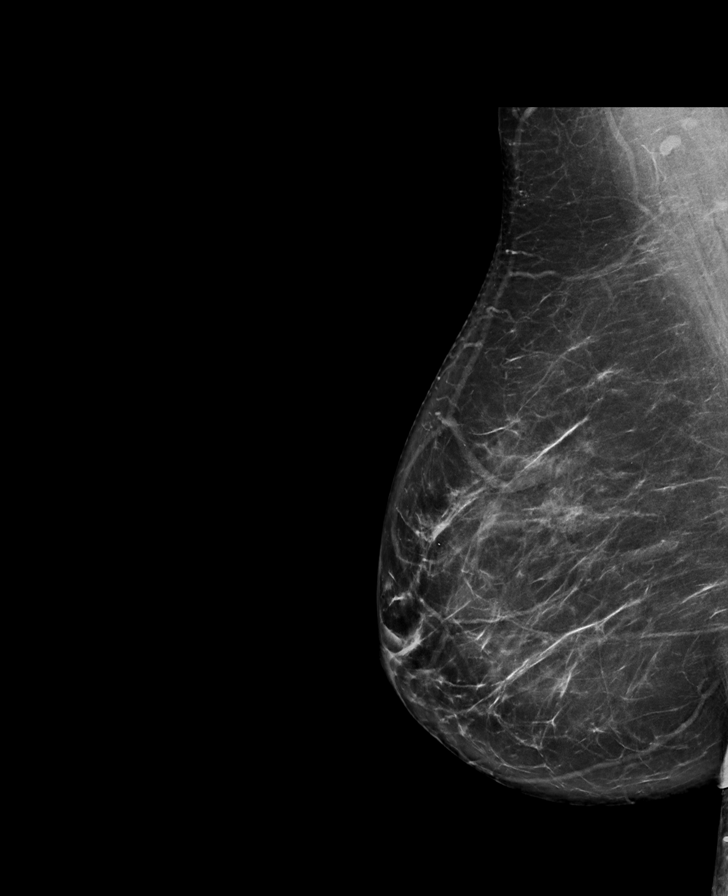

[R CC tomo · tomo slice 37/72.0]
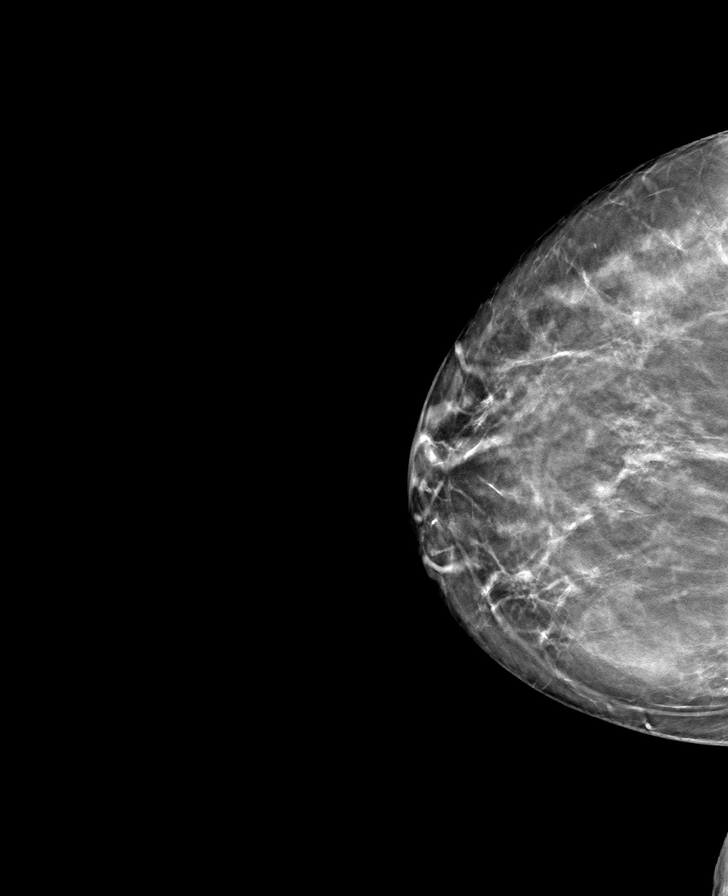

[L MLO tomo · tomo slice 41/81.0]
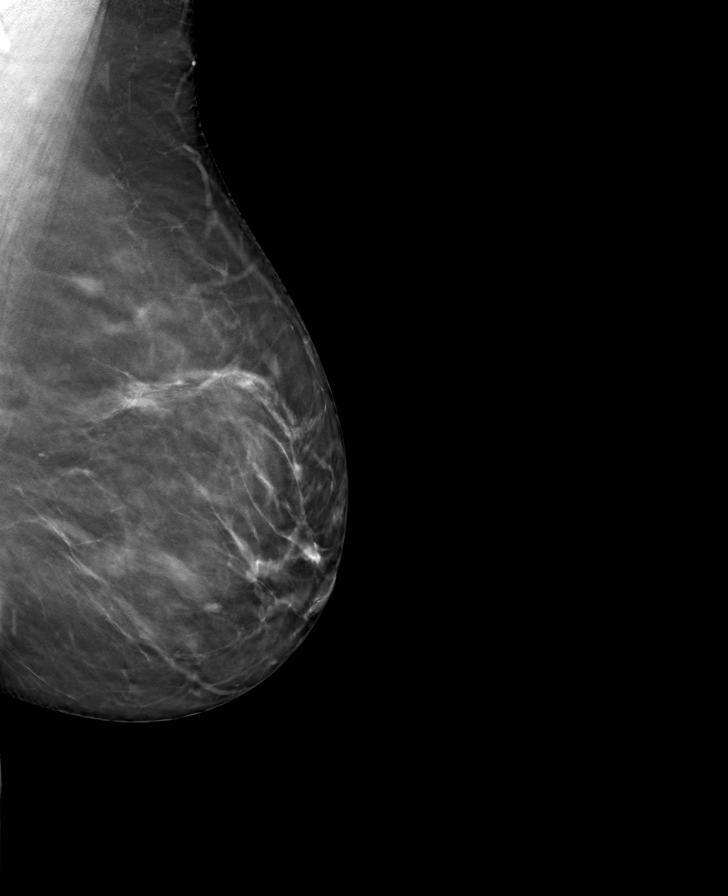

[R MLO tomo · tomo slice 39/77.0]
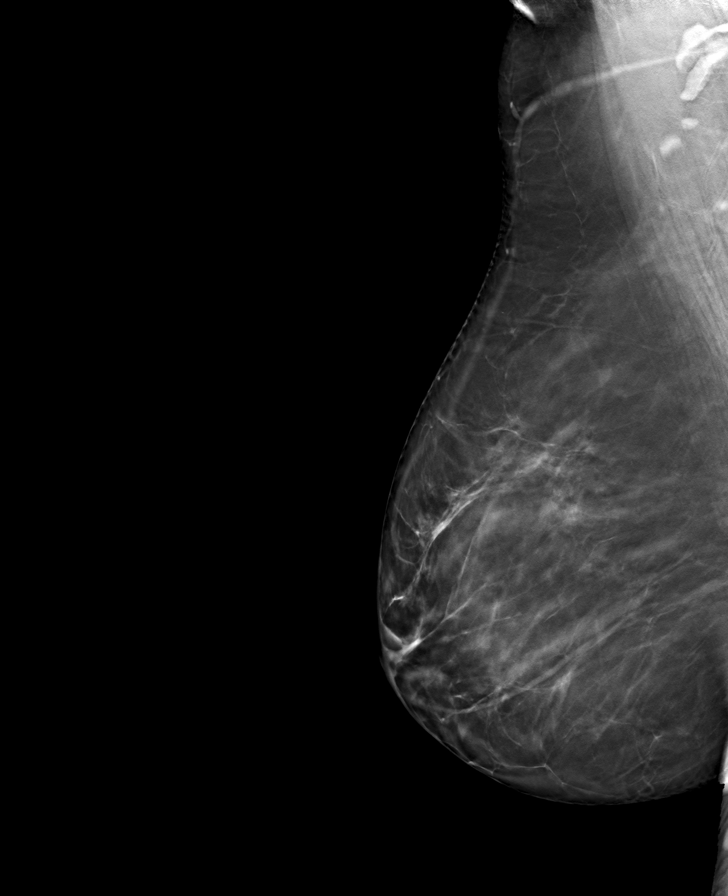

[L CC tomo · tomo slice 37/74.0]
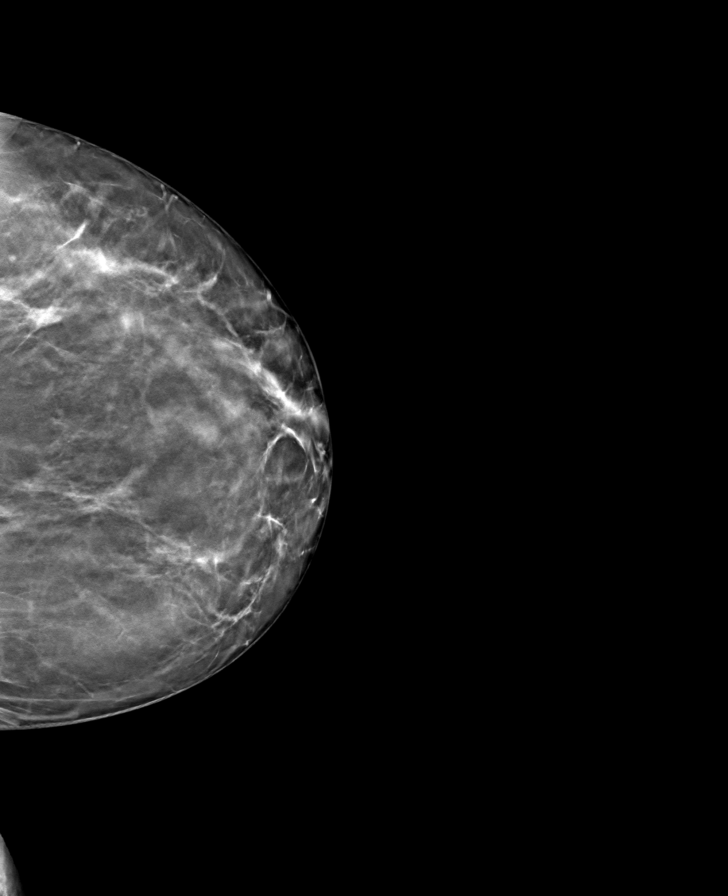

[8 of 24 positions shown; findings below may reference images not displayed]

ACR Breast Density Category b: There are scattered areas of
fibroglandular density.
FINDINGS: In the left breast, possible distortion and a separate possible mass
warrant further evaluation. In the right breast, no findings
suspicious for malignancy. Images were processed with CAD.
IMPRESSION: Further evaluation is suggested for possible distortion and a
separate possible mass in the left breast.

RECOMMENDATION:
Diagnostic mammogram and possibly ultrasound of the left breast.
(Code:[id number])

The patient will be contacted regarding the findings, and additional
imaging will be scheduled.

BI-RADS CATEGORY  0: Incomplete. Need additional imaging evaluation
and/or prior mammograms for comparison.

## 2022-11-29 IMAGING — NM NM BONE WHOLE BODY
2 series · 2 of 2 positions shown · non-contrast
Comparison: None

Correlation: CT chest abdomen and pelvis 11/26/2020

CLINICAL DATA: Breast cancer staging, LEFT breast cancer with
multiple positive lymph nodes

EXAM:
NUCLEAR MEDICINE WHOLE BODY BONE SCAN
TECHNIQUE: Whole body anterior and posterior images were obtained approximately
3 hours after intravenous injection of radiopharmaceutical.
RADIOPHARMACEUTICALS:  22.0 mCi 6echnetium-XXm MDP IV

[Series 1: wbr_bone_40 whole body · 2.66mm/px · 1 of 1 slices shown (1 of 2)]
[im 1/1]
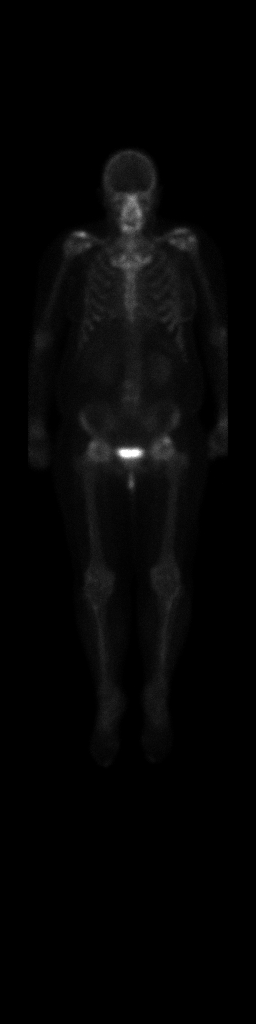

[Series 1: wbr_bone_40 whole body · 2.66mm/px · 1 of 1 slices shown (2 of 2)]
[im 1/1]
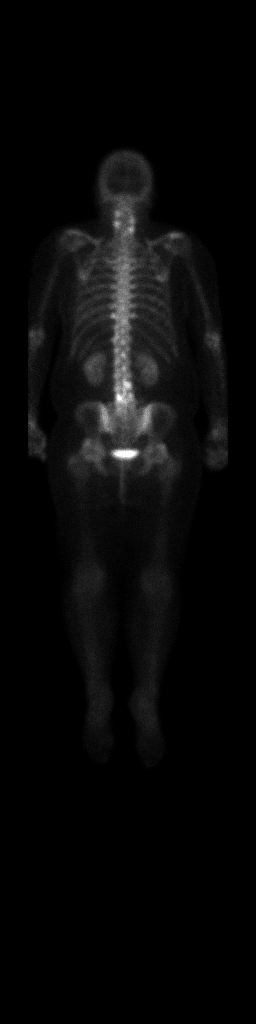

[2 of 2 positions shown; findings below may reference images not displayed]

FINDINGS: Uptake at shoulders, sternoclavicular joints, and hips, typically
degenerative.

Uptake at lateral aspects of cervical spine bilaterally and at
several costovertebral junctions, typically degenerative.

Uptake in lumbar spine at RIGHT L3 and LEFT L4, nonspecific, could
be due to degenerative disease though metastases are not
scintigraphically excluded; CT demonstrates multilevel degenerative
disc and facet disease changes in the mid to lower lumbar spine but
no discrete metastases

Asymmetric uptake at the first costochondral junctions bilaterally,
likely degenerative.

No additional sites of abnormal tracer uptake are seen.

Expected urinary tract and soft tissue distribution of tracer.
IMPRESSION: Scattered degenerative type uptake at multiple joints.

Additional uptake in the lumbar spine at RIGHT L3 and LEFT L4,,
nonspecific, may be degenerative though cannot definitively exclude
metastases scintigraphically; CT demonstrates only multilevel
degenerative changes of the lumbar spine.

## 2023-06-28 ENCOUNTER — Emergency Department (HOSPITAL_COMMUNITY)
Admission: EM | Admit: 2023-06-28 | Discharge: 2023-06-28 | Discharge status: Against Medical Advice | Payer: MEDICAID | Attending: Emergency Medicine | Admitting: Emergency Medicine

## 2023-06-28 ENCOUNTER — Other Ambulatory Visit: Payer: Self-pay

## 2023-06-28 ENCOUNTER — Encounter: Payer: Self-pay | Admitting: Hematology and Oncology

## 2023-06-28 ENCOUNTER — Encounter (HOSPITAL_COMMUNITY): Payer: Self-pay

## 2023-06-28 DIAGNOSIS — N644 Mastodynia: Secondary | ICD-10-CM | POA: Diagnosis present

## 2023-06-28 DIAGNOSIS — Z5321 Procedure and treatment not carried out due to patient leaving prior to being seen by health care provider: Secondary | ICD-10-CM | POA: Insufficient documentation

## 2023-06-28 DIAGNOSIS — R531 Weakness: Secondary | ICD-10-CM | POA: Insufficient documentation

## 2023-06-28 DIAGNOSIS — Z853 Personal history of malignant neoplasm of breast: Secondary | ICD-10-CM | POA: Diagnosis not present

## 2023-06-28 NOTE — ED Triage Notes (Signed)
Pt arrived reporting L breast pain for a while. States Hx of breast Ca.was in remission. States cancer is back and she is suppose to start F/U with oncology. Reports weakness and not feeling well over all the past week.

## 2023-06-28 NOTE — ED Provider Triage Note (Cosign Needed Addendum)
Emergency Medicine Provider Triage Evaluation Note  Lydia Collins , a 57 y.o. female  was evaluated in triage.  Pt complains of left breast pain and needing refill with pain medication that she was provided by her oncology office.  Upon chart review, she has not been seen by her oncologist for the past 2 years with multiple missed chemo appointments. Reports that she plans to make an appointment when the provider got back from vacation next week but needs pain medication until then  Review of Systems  Positive: Left breast pain Negative: Nipple discharge, fevers, skin changes  Physical Exam  BP (!) 166/102 (BP Location: Right Arm)   Pulse (!) 108   Temp 98.2 F (36.8 C) (Oral)   Resp 16   Ht 5\' 2"  (1.575 m)   Wt 68.2 kg   LMP 04/05/2012   BMI 27.50 kg/m  Gen:   Awake, no distress   Resp:  Normal effort  MSK:   Moves extremities without difficulty  Other:  Tachycardia, pressured speech  Medical Decision Making  Medically screening exam initiated at 1:51 PM.  Appropriate orders placed.  Lydia Collins was informed that the remainder of the evaluation will be completed by another provider, this initial triage assessment does not replace that evaluation, and the importance of remaining in the ED until their evaluation is complete.      Judithann Sheen, PA 06/28/23 1356

## 2023-06-29 ENCOUNTER — Other Ambulatory Visit: Payer: Self-pay

## 2023-07-07 ENCOUNTER — Other Ambulatory Visit: Payer: Self-pay | Admitting: *Deleted

## 2023-07-07 DIAGNOSIS — C50512 Malignant neoplasm of lower-outer quadrant of left female breast: Secondary | ICD-10-CM

## 2023-07-09 ENCOUNTER — Inpatient Hospital Stay: Payer: MEDICAID

## 2023-07-09 ENCOUNTER — Ambulatory Visit: Payer: Medicaid Other | Admitting: Adult Health

## 2023-07-09 ENCOUNTER — Telehealth: Payer: Self-pay

## 2023-07-09 NOTE — Telephone Encounter (Signed)
This RN called pt about missed appts 11/1. Pt states that she would like to reschedule for Monday. This RN informed pt that she would send a message to scheduling and have them reach out to see what is available. Pt verbalized understanding.

## 2023-07-12 ENCOUNTER — Telehealth: Payer: Self-pay | Admitting: Adult Health

## 2023-07-12 NOTE — Telephone Encounter (Signed)
Patient is aware of rescheduled appointment times/dates due to missed appointment on 11/1

## 2023-07-13 ENCOUNTER — Other Ambulatory Visit: Payer: Self-pay

## 2023-07-16 ENCOUNTER — Encounter: Payer: Self-pay | Admitting: Hematology and Oncology

## 2023-07-23 ENCOUNTER — Inpatient Hospital Stay: Payer: MEDICAID | Admitting: Adult Health

## 2023-07-23 ENCOUNTER — Inpatient Hospital Stay: Payer: MEDICAID

## 2023-08-01 ENCOUNTER — Other Ambulatory Visit: Payer: Self-pay

## 2023-09-24 ENCOUNTER — Encounter: Payer: Self-pay | Admitting: Hematology and Oncology

## 2024-02-22 ENCOUNTER — Telehealth: Payer: Self-pay | Admitting: Adult Health

## 2024-02-22 NOTE — Telephone Encounter (Signed)
 Encouraged this pt to try and make this appoitnment due to multiple no show appts and canceled infusions. Last time seen was 05/2021

## 2024-02-23 ENCOUNTER — Other Ambulatory Visit: Payer: Self-pay

## 2024-02-25 ENCOUNTER — Inpatient Hospital Stay: Payer: MEDICAID | Admitting: Adult Health

## 2024-06-21 ENCOUNTER — Other Ambulatory Visit: Payer: Self-pay

## 2024-06-27 ENCOUNTER — Other Ambulatory Visit: Payer: Self-pay | Admitting: Oncology

## 2024-06-27 ENCOUNTER — Inpatient Hospital Stay: Payer: MEDICAID | Attending: Oncology | Admitting: Oncology

## 2024-06-27 DIAGNOSIS — C50512 Malignant neoplasm of lower-outer quadrant of left female breast: Secondary | ICD-10-CM

## 2024-06-27 NOTE — Progress Notes (Deleted)
 [facility] at [facility] [address] [place],  [place]  [address] [phone]  Clinic Day:  06/27/2024  Referring physician: Felicie Powell Chol, *   HISTORY OF PRESENT ILLNESS:  Lydia Collins is a 58 y.o. 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  Receptor testing showed her tumor to be estrogen receptor positive, progesterone receptor positive, but Her2/neu receptor (1+) negative.  Of note, 25/26 left axillary lymph nodes came back positive for nodal involvement.  He received dose dense Adriamycin /Cytoxan , but apparently only received 3/12 weeks of weekly Paclitaxel .      Her family history is pertinent for ***breast cancer.  There *** ovarian cancer PAST MEDICAL HISTORY:   Past Medical History:  Diagnosis Date  . Anemia   . Anxiety   . Cancer (HCC) 08/2020   ILC left breast  . Chronic narcotic use   . Chronic pain   . Dyspnea    with activity  . Hypertension   . Smoker     PAST SURGICAL HISTORY:   Past Surgical History:  Procedure Laterality Date  . ABDOMINAL HYSTERECTOMY     partial hysterectomy   . AXILLARY LYMPH NODE DISSECTION Left 11/08/2020   Procedure: LEFT AXILLARY LYMPH NODE DISSECTION;  Surgeon: Curvin Deward MOULD, MD;  Location: [facility];  Service: General;  Laterality: Left;  . BREAST LUMPECTOMY WITH RADIOACTIVE SEED AND SENTINEL LYMPH NODE BIOPSY Left 09/13/2020   Procedure: LEFT BREAST LUMPECTOMY WITH RADIOACTIVE SEED AND SENTINEL LYMPH NODE BIOPSY;  Surgeon: Curvin Deward MOULD, MD;  Location: [facility];  Service: General;  Laterality: Left;  . PORTACATH PLACEMENT N/A 12/05/2020   Procedure: INSERTION PORT-A-CATH;  Surgeon: Curvin Deward MOULD, MD;  Location: [facility];  Service: General;  Laterality: N/A;  . RE-EXCISION OF BREAST LUMPECTOMY  Left 11/08/2020   Procedure: RE-EXCISION OF LEFT BREAST MARGIN;  Surgeon: Curvin Deward MOULD, MD;  Location:  SUR[facility]ERY C[facility];  Service: General;  Laterality: Left;    CURRENT MEDICATIONS:   Current Outpatient Medications  Medication Sig Dispense Refill  . acetaminophen  (TYLENOL ) 500 MG tablet Take 1,000-1,500 mg by mouth every 6 (six) hours as needed for fever (pain).    . ALPRAZolam  (XANAX ) 0.25 MG tablet Take 1 tablet (0.25 mg total) by mouth 2 (two) times daily as needed for anxiety. 60 tablet 3  . apixaban  (ELIQUIS ) 5 MG TABS tablet Take 1 tablet (5 mg total) by mouth 2 (two) times daily. 60 tablet 0  . cetirizine (ZYRTEC) 10 MG tablet Take 10 mg by mouth every morning.    . chlorthalidone  (HYGROTON ) 25 MG tablet Take 25 mg by mouth every morning.    . cyclobenzaprine  (FLEXERIL ) 5 MG tablet Take 1 tablet (5 mg total) by mouth 3 (three) times daily as needed for muscle spasms. 20 tablet 0  . gabapentin  (NEURONTIN ) 100 MG capsule Take 3 capsules (300 mg total) by mouth at bedtime. 90 capsule 1  . lidocaine -prilocaine  (EMLA ) cream Apply to affected area once (Patient taking differently: Apply 1 application topically once as needed (prior to port access).) 30 g 3  . magic mouthwash w/lidocaine  SOLN Take 5 mLs by mouth 4 (four) times daily as needed for mouth pain. 100 mL 0  . ondansetron  (ZOFRAN ) 8 MG tablet TAKE 1 TABLET (8 MG TOTAL) BY MOUTH 2 (  TWO) TIMES DAILY AS NEEDED. START ON THE THIRD DAY AFTER CHEMOTHERAPY. 30 tablet 1  . polyethylene glycol (MIRALAX  / GLYCOLAX ) 17 g packet Take 17 g by mouth daily as needed for moderate constipation. 14 each 0  . potassium chloride  SA (KLOR-CON ) 20 MEQ tablet Take 1 tablet (20 mEq total) by mouth daily. 30 tablet 3  . prochlorperazine  (COMPAZINE ) 10 MG tablet Take 1 tablet (10 mg total) by mouth every 6 (six) hours as needed (Nausea or vomiting). 30 tablet 3  . senna-docusate (SENOKOT-S) 8.6-50 MG tablet Take 1 tablet by mouth 2 (two) times daily.  20 tablet 0   No current facility-administered medications for this visit.   Facility-Administered Medications Ordered in Other Visits  Medication Dose Route Frequency Provider Last Rate Last Admin  . sodium chloride  flush (NS) 0.9 % injection 10 mL  10 mL Intracatheter PRN Odean Potts, MD   10 mL at 12/27/20 1640  . sodium chloride  flush (NS) 0.9 % injection 10 mL  10 mL Intracatheter Once Gudena, Vinay, MD        ALLERGIES:   Allergies  Allergen Reactions  . Ibuprofen  Other (See Comments)    Stomach burns  . Naproxen  Other (See Comments)    Stomach burns    FAMILY HISTORY:   Family History  Problem Relation Age of Onset  . Breast cancer Neg Hx     SOCIAL HISTORY:   reports that she has been smoking cigarettes. She has never used smokeless tobacco. She reports that she does not drink alcohol and does not use drugs.  REVIEW OF SYSTEMS:  Review of Systems - Oncology   PHYSICAL EXAM:  Last menstrual period 04/05/2012. Wt Readings from Last 3 Encounters:  06/28/23 150 lb 5.7 oz (68.2 kg)  04/28/21 154 lb 3.2 oz (69.9 kg)  04/15/21 158 lb (71.7 kg)   There is no height or weight on file to calculate BMI. Performance status (ECOG): {CHL ONC D053438 Physical Exam  LABS:      Latest Ref Rng & Units 04/28/2021    9:29 AM 04/15/2021   12:19 PM 04/07/2021   10:57 AM  CBC  WBC 4.0 - 10.5 K/uL 6.2  7.9  7.1   Hemoglobin 12.0 - 15.0 g/dL 85.9  86.8  86.4   Hematocrit 36.0 - 46.0 % 42.1  38.3  39.5   Platelets 150 - 400 K/uL 298  197  216       Latest Ref Rng & Units 04/28/2021    9:29 AM 04/15/2021   12:19 PM 04/07/2021   10:57 AM  CMP  Glucose 70 - 99 mg/dL 893  93  88   BUN 6 - 20 mg/dL 8  11  8    Creatinine 0.44 - 1.00 mg/dL 9.23  9.29  9.27   Sodium 135 - 145 mmol/L 142  139  139   Potassium 3.5 - 5.1 mmol/L 3.4  3.0  3.5   Chloride 98 - 111 mmol/L 104  100  102   CO2 22 - 32 mmol/L 25  27  27    Calcium 8.9 - 10.3 mg/dL 89.8  9.8  9.7   Total Protein 6.5 -  8.1 g/dL 7.7  7.1  7.0   Total Bilirubin 0.3 - 1.2 mg/dL <9.7  0.3  0.2   Alkaline Phos 38 - 126 U/L 91  86  75   AST 15 - 41 U/L 12  15  15    ALT 0 - 44 U/L 14  18  13      No results found for: CEA1, CEA / No results found for: CEA1, CEA No results found for: PSA1 No results found for: CAN199 No results found for: CAN125  No results found for: TOTALPROTELP, ALBUMINELP, A1GS, A2GS, BETS, BETA2SER, GAMS, MSPIKE, SPEI No results found for: TIBC, FERRITIN, IRONPCTSAT No results found for: LDH  Review Flowsheet        No data to display           STUDIES:  No results found.   ASSESSMENT & PLAN:   Lydia Collins is a 58 y.o. female who I was asked to consult upon for newly diagnosed stage *** breast cancer, status post a lump/mastectomy in ***.     The patient understands all the plans discussed today and is in agreement with them.  I do appreciate Silver, Powell Chol, * for his new consult. so   Tippi Mccrae DELENA Kerns, MD

## 2024-06-28 ENCOUNTER — Other Ambulatory Visit: Payer: Self-pay

## 2024-07-05 NOTE — Progress Notes (Deleted)
 [facility] at [facility] [address] [place],  [place]  [address] [phone]  Clinic Day:  07/05/2024  Referring physician: Felicie Powell Chol, *   HISTORY OF PRESENT ILLNESS:  Lydia Collins is a 58 y.o. 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  Receptor testing showed her tumor to be estrogen receptor positive, progesterone receptor positive, but Her2/neu receptor (1+) negative.  Of note, 25/26 left axillary lymph nodes came back positive for nodal involvement.  He received dose dense Adriamycin /Cytoxan , but apparently only received 3/12 weeks of weekly Paclitaxel .      Her family history is pertinent for ***breast cancer.  There *** ovarian cancer PAST MEDICAL HISTORY:   Past Medical History:  Diagnosis Date  . Anemia   . Anxiety   . Cancer (HCC) 08/2020   ILC left breast  . Chronic narcotic use   . Chronic pain   . Dyspnea    with activity  . Hypertension   . Smoker     PAST SURGICAL HISTORY:   Past Surgical History:  Procedure Laterality Date  . ABDOMINAL HYSTERECTOMY     partial hysterectomy   . AXILLARY LYMPH NODE DISSECTION Left 11/08/2020   Procedure: LEFT AXILLARY LYMPH NODE DISSECTION;  Surgeon: Curvin Deward MOULD, MD;  Location: [facility];  Service: General;  Laterality: Left;  . BREAST LUMPECTOMY WITH RADIOACTIVE SEED AND SENTINEL LYMPH NODE BIOPSY Left 09/13/2020   Procedure: LEFT BREAST LUMPECTOMY WITH RADIOACTIVE SEED AND SENTINEL LYMPH NODE BIOPSY;  Surgeon: Curvin Deward MOULD, MD;  Location: [facility];  Service: General;  Laterality: Left;  . PORTACATH PLACEMENT N/A 12/05/2020   Procedure: INSERTION PORT-A-CATH;  Surgeon: Curvin Deward MOULD, MD;  Location: [facility];  Service: General;  Laterality: N/A;  . RE-EXCISION OF BREAST LUMPECTOMY  Left 11/08/2020   Procedure: RE-EXCISION OF LEFT BREAST MARGIN;  Surgeon: Curvin Deward MOULD, MD;  Location: [facility];  Service: General;  Laterality: Left;    CURRENT MEDICATIONS:   Current Outpatient Medications  Medication Sig Dispense Refill  . acetaminophen  (TYLENOL ) 500 MG tablet Take 1,000-1,500 mg by mouth every 6 (six) hours as needed for fever (pain).    . ALPRAZolam  (XANAX ) 0.25 MG tablet Take 1 tablet (0.25 mg total) by mouth 2 (two) times daily as needed for anxiety. 60 tablet 3  . apixaban  (ELIQUIS ) 5 MG TABS tablet Take 1 tablet (5 mg total) by mouth 2 (two) times daily. 60 tablet 0  . cetirizine (ZYRTEC) 10 MG tablet Take 10 mg by mouth every morning.    . chlorthalidone  (HYGROTON ) 25 MG tablet Take 25 mg by mouth every morning.    . cyclobenzaprine  (FLEXERIL ) 5 MG tablet Take 1 tablet (5 mg total) by mouth 3 (three) times daily as needed for muscle spasms. 20 tablet 0  . gabapentin  (NEURONTIN ) 100 MG capsule Take 3 capsules (300 mg total) by mouth at bedtime. 90 capsule 1  . lidocaine -prilocaine  (EMLA ) cream Apply to affected area once (Patient taking differently: Apply 1 application topically once as needed (prior to port access).) 30 g 3  . magic mouthwash w/lidocaine  SOLN Take 5 mLs by mouth 4 (four) times daily as needed for mouth pain. 100 mL 0  . ondansetron  (ZOFRAN ) 8 MG tablet TAKE 1 TABLET (8 MG TOTAL) BY MOUTH 2 (  TWO) TIMES DAILY AS NEEDED. START ON THE THIRD DAY AFTER CHEMOTHERAPY. 30 tablet 1  . polyethylene glycol (MIRALAX  / GLYCOLAX ) 17 g packet Take 17 g by mouth daily as needed for moderate constipation. 14 each 0  . potassium chloride  SA (KLOR-CON ) 20 MEQ tablet Take 1 tablet (20 mEq total) by mouth daily. 30 tablet 3  . prochlorperazine  (COMPAZINE ) 10 MG tablet Take 1 tablet (10 mg total) by mouth every 6 (six) hours as needed (Nausea or vomiting). 30 tablet 3  . senna-docusate (SENOKOT-S) 8.6-50 MG tablet Take 1 tablet by mouth 2 (two) times daily.  20 tablet 0   No current facility-administered medications for this visit.   Facility-Administered Medications Ordered in Other Visits  Medication Dose Route Frequency Provider Last Rate Last Admin  . sodium chloride  flush (NS) 0.9 % injection 10 mL  10 mL Intracatheter PRN Odean Potts, MD   10 mL at 12/27/20 1640  . sodium chloride  flush (NS) 0.9 % injection 10 mL  10 mL Intracatheter Once Gudena, Vinay, MD        ALLERGIES:   Allergies  Allergen Reactions  . Ibuprofen  Other (See Comments)    Stomach burns  . Naproxen  Other (See Comments)    Stomach burns    FAMILY HISTORY:   Family History  Problem Relation Age of Onset  . Breast cancer Neg Hx     SOCIAL HISTORY:   reports that she has been smoking cigarettes. She has never used smokeless tobacco. She reports that she does not drink alcohol and does not use drugs.  REVIEW OF SYSTEMS:  Review of Systems - Oncology   PHYSICAL EXAM:  Last menstrual period 04/05/2012. Wt Readings from Last 3 Encounters:  06/28/23 150 lb 5.7 oz (68.2 kg)  04/28/21 154 lb 3.2 oz (69.9 kg)  04/15/21 158 lb (71.7 kg)   There is no height or weight on file to calculate BMI. Performance status (ECOG): {CHL ONC D053438 Physical Exam  LABS:      Latest Ref Rng & Units 04/28/2021    9:29 AM 04/15/2021   12:19 PM 04/07/2021   10:57 AM  CBC  WBC 4.0 - 10.5 K/uL 6.2  7.9  7.1   Hemoglobin 12.0 - 15.0 g/dL 85.9  86.8  86.4   Hematocrit 36.0 - 46.0 % 42.1  38.3  39.5   Platelets 150 - 400 K/uL 298  197  216       Latest Ref Rng & Units 04/28/2021    9:29 AM 04/15/2021   12:19 PM 04/07/2021   10:57 AM  CMP  Glucose 70 - 99 mg/dL 893  93  88   BUN 6 - 20 mg/dL 8  11  8    Creatinine 0.44 - 1.00 mg/dL 9.23  9.29  9.27   Sodium 135 - 145 mmol/L 142  139  139   Potassium 3.5 - 5.1 mmol/L 3.4  3.0  3.5   Chloride 98 - 111 mmol/L 104  100  102   CO2 22 - 32 mmol/L 25  27  27    Calcium 8.9 - 10.3 mg/dL 89.8  9.8  9.7   Total Protein 6.5 -  8.1 g/dL 7.7  7.1  7.0   Total Bilirubin 0.3 - 1.2 mg/dL <9.7  0.3  0.2   Alkaline Phos 38 - 126 U/L 91  86  75   AST 15 - 41 U/L 12  15  15    ALT 0 - 44 U/L 14  18  13      No results found for: CEA1, CEA / No results found for: CEA1, CEA No results found for: PSA1 No results found for: CAN199 No results found for: CAN125  No results found for: TOTALPROTELP, ALBUMINELP, A1GS, A2GS, BETS, BETA2SER, GAMS, MSPIKE, SPEI No results found for: TIBC, FERRITIN, IRONPCTSAT No results found for: LDH  Review Flowsheet        No data to display           STUDIES:  No results found.   ASSESSMENT & PLAN:   Lydia Collins is a 58 y.o. female who I was asked to consult upon for newly diagnosed stage *** breast cancer, status post a lump/mastectomy in ***.     The patient understands all the plans discussed today and is in agreement with them.  I do appreciate Silver, Powell Chol, * for his new consult. so   Geonna Lockyer DELENA Kerns, MD

## 2024-07-06 ENCOUNTER — Inpatient Hospital Stay: Payer: MEDICAID | Admitting: Oncology
